# Patient Record
Sex: Female | Born: 1940 | Race: White | Hispanic: No | Smoking: Former smoker
Health system: Southern US, Community
[De-identification: ages and names within clinical notes are randomized; demographics above are authoritative.]

## PROBLEM LIST (undated history)

## (undated) DIAGNOSIS — R011 Cardiac murmur, unspecified: Secondary | ICD-10-CM

## (undated) DIAGNOSIS — Z96651 Presence of right artificial knee joint: Secondary | ICD-10-CM

## (undated) DIAGNOSIS — E785 Hyperlipidemia, unspecified: Secondary | ICD-10-CM

## (undated) DIAGNOSIS — I2510 Atherosclerotic heart disease of native coronary artery without angina pectoris: Secondary | ICD-10-CM

## (undated) DIAGNOSIS — H8110 Benign paroxysmal vertigo, unspecified ear: Secondary | ICD-10-CM

## (undated) DIAGNOSIS — K219 Gastro-esophageal reflux disease without esophagitis: Secondary | ICD-10-CM

## (undated) DIAGNOSIS — IMO0001 Reserved for inherently not codable concepts without codable children: Secondary | ICD-10-CM

## (undated) DIAGNOSIS — K649 Unspecified hemorrhoids: Secondary | ICD-10-CM

## (undated) DIAGNOSIS — E079 Disorder of thyroid, unspecified: Secondary | ICD-10-CM

## (undated) DIAGNOSIS — I10 Essential (primary) hypertension: Secondary | ICD-10-CM

## (undated) DIAGNOSIS — Z8489 Family history of other specified conditions: Secondary | ICD-10-CM

## (undated) DIAGNOSIS — D494 Neoplasm of unspecified behavior of bladder: Secondary | ICD-10-CM

## (undated) DIAGNOSIS — D649 Anemia, unspecified: Secondary | ICD-10-CM

## (undated) DIAGNOSIS — E039 Hypothyroidism, unspecified: Secondary | ICD-10-CM

## (undated) DIAGNOSIS — R319 Hematuria, unspecified: Secondary | ICD-10-CM

## (undated) DIAGNOSIS — I6529 Occlusion and stenosis of unspecified carotid artery: Secondary | ICD-10-CM

## (undated) DIAGNOSIS — C801 Malignant (primary) neoplasm, unspecified: Secondary | ICD-10-CM

## (undated) DIAGNOSIS — Z972 Presence of dental prosthetic device (complete) (partial): Secondary | ICD-10-CM

## (undated) DIAGNOSIS — G4730 Sleep apnea, unspecified: Secondary | ICD-10-CM

## (undated) DIAGNOSIS — M1990 Unspecified osteoarthritis, unspecified site: Secondary | ICD-10-CM

## (undated) DIAGNOSIS — E871 Hypo-osmolality and hyponatremia: Secondary | ICD-10-CM

## (undated) DIAGNOSIS — J449 Chronic obstructive pulmonary disease, unspecified: Secondary | ICD-10-CM

## (undated) DIAGNOSIS — K2100 Gastro-esophageal reflux disease with esophagitis, without bleeding: Secondary | ICD-10-CM

## (undated) DIAGNOSIS — I517 Cardiomegaly: Secondary | ICD-10-CM

## (undated) DIAGNOSIS — M1712 Unilateral primary osteoarthritis, left knee: Secondary | ICD-10-CM

## (undated) DIAGNOSIS — M2550 Pain in unspecified joint: Secondary | ICD-10-CM

## (undated) DIAGNOSIS — K2270 Barrett's esophagus without dysplasia: Secondary | ICD-10-CM

## (undated) DIAGNOSIS — B351 Tinea unguium: Secondary | ICD-10-CM

## (undated) DIAGNOSIS — K5720 Diverticulitis of large intestine with perforation and abscess without bleeding: Secondary | ICD-10-CM

## (undated) DIAGNOSIS — R7303 Prediabetes: Secondary | ICD-10-CM

## (undated) DIAGNOSIS — Z8619 Personal history of other infectious and parasitic diseases: Secondary | ICD-10-CM

## (undated) HISTORY — DX: Occlusion and stenosis of unspecified carotid artery: I65.29

## (undated) HISTORY — DX: Unspecified hemorrhoids: K64.9

## (undated) HISTORY — DX: Disorder of thyroid, unspecified: E07.9

## (undated) HISTORY — DX: Barrett's esophagus without dysplasia: K22.70

## (undated) HISTORY — DX: Gastro-esophageal reflux disease without esophagitis: K21.9

## (undated) HISTORY — DX: Cardiomegaly: I51.7

## (undated) HISTORY — DX: Unspecified osteoarthritis, unspecified site: M19.90

## (undated) HISTORY — PX: POPLITEAL SYNOVIAL CYST EXCISION: SUR555

## (undated) HISTORY — DX: Gastro-esophageal reflux disease with esophagitis, without bleeding: K21.00

## (undated) HISTORY — DX: Neoplasm of unspecified behavior of bladder: D49.4

## (undated) HISTORY — PX: UPPER GI ENDOSCOPY: SHX6162

## (undated) HISTORY — DX: Diverticulitis of large intestine with perforation and abscess without bleeding: K57.20

## (undated) HISTORY — DX: Personal history of other infectious and parasitic diseases: Z86.19

## (undated) HISTORY — DX: Essential (primary) hypertension: I10

## (undated) HISTORY — DX: Hypothyroidism, unspecified: E03.9

## (undated) HISTORY — DX: Tinea unguium: B35.1

## (undated) HISTORY — DX: Reserved for inherently not codable concepts without codable children: IMO0001

## (undated) HISTORY — DX: Anemia, unspecified: D64.9

## (undated) HISTORY — DX: Pain in unspecified joint: M25.50

## (undated) HISTORY — DX: Gastro-esophageal reflux disease with esophagitis: K21.0

## (undated) HISTORY — DX: Hyperlipidemia, unspecified: E78.5

## (undated) HISTORY — DX: Hematuria, unspecified: R31.9

---

## 1978-06-21 HISTORY — PX: OTHER SURGICAL HISTORY: SHX169

## 1986-06-21 HISTORY — PX: BREAST EXCISIONAL BIOPSY: SUR124

## 2001-04-25 ENCOUNTER — Other Ambulatory Visit: Admission: RE | Admit: 2001-04-25 | Discharge: 2001-04-25 | Payer: Self-pay | Admitting: Family Medicine

## 2002-06-21 DIAGNOSIS — H8110 Benign paroxysmal vertigo, unspecified ear: Secondary | ICD-10-CM

## 2002-06-21 HISTORY — DX: Benign paroxysmal vertigo, unspecified ear: H81.10

## 2004-06-21 HISTORY — PX: EYE SURGERY: SHX253

## 2004-12-08 ENCOUNTER — Ambulatory Visit: Payer: Self-pay | Admitting: Family Medicine

## 2005-12-28 ENCOUNTER — Ambulatory Visit: Payer: Self-pay | Admitting: Family Medicine

## 2005-12-30 ENCOUNTER — Ambulatory Visit: Payer: Self-pay | Admitting: Family Medicine

## 2006-06-06 ENCOUNTER — Ambulatory Visit: Payer: Self-pay | Admitting: Otolaryngology

## 2006-06-27 ENCOUNTER — Encounter: Payer: Self-pay | Admitting: Family Medicine

## 2006-07-04 ENCOUNTER — Ambulatory Visit: Payer: Self-pay | Admitting: Family Medicine

## 2006-07-22 ENCOUNTER — Encounter: Payer: Self-pay | Admitting: Family Medicine

## 2006-08-20 ENCOUNTER — Encounter: Payer: Self-pay | Admitting: Family Medicine

## 2006-09-20 ENCOUNTER — Encounter: Payer: Self-pay | Admitting: Family Medicine

## 2006-10-20 ENCOUNTER — Encounter: Payer: Self-pay | Admitting: Family Medicine

## 2006-11-20 ENCOUNTER — Encounter: Payer: Self-pay | Admitting: Family Medicine

## 2006-12-20 ENCOUNTER — Encounter: Payer: Self-pay | Admitting: Family Medicine

## 2007-09-19 ENCOUNTER — Ambulatory Visit: Payer: Self-pay | Admitting: Family Medicine

## 2008-08-28 ENCOUNTER — Ambulatory Visit: Payer: Self-pay | Admitting: Unknown Physician Specialty

## 2008-10-01 ENCOUNTER — Ambulatory Visit: Payer: Self-pay | Admitting: Family Medicine

## 2009-06-21 DIAGNOSIS — G4730 Sleep apnea, unspecified: Secondary | ICD-10-CM

## 2009-06-21 HISTORY — DX: Sleep apnea, unspecified: G47.30

## 2009-10-02 ENCOUNTER — Ambulatory Visit: Payer: Self-pay | Admitting: Family Medicine

## 2010-06-21 DIAGNOSIS — K2270 Barrett's esophagus without dysplasia: Secondary | ICD-10-CM

## 2010-06-21 HISTORY — DX: Barrett's esophagus without dysplasia: K22.70

## 2010-07-13 ENCOUNTER — Ambulatory Visit: Payer: Self-pay | Admitting: Unknown Physician Specialty

## 2010-10-13 ENCOUNTER — Ambulatory Visit: Payer: Self-pay | Admitting: Family Medicine

## 2010-11-13 LAB — HM PAP SMEAR: HM Pap smear: NORMAL

## 2011-11-09 ENCOUNTER — Ambulatory Visit: Payer: Self-pay | Admitting: Family Medicine

## 2011-11-23 ENCOUNTER — Ambulatory Visit: Payer: Self-pay | Admitting: Family Medicine

## 2012-08-29 ENCOUNTER — Encounter: Payer: Self-pay | Admitting: *Deleted

## 2012-09-01 ENCOUNTER — Encounter: Payer: Self-pay | Admitting: *Deleted

## 2012-09-07 ENCOUNTER — Ambulatory Visit (INDEPENDENT_AMBULATORY_CARE_PROVIDER_SITE_OTHER): Payer: Medicare Other | Admitting: General Surgery

## 2012-09-07 ENCOUNTER — Encounter: Payer: Self-pay | Admitting: General Surgery

## 2012-09-07 VITALS — BP 165/95 | HR 78 | Resp 14 | Ht 64.0 in | Wt 207.0 lb

## 2012-09-07 DIAGNOSIS — D649 Anemia, unspecified: Secondary | ICD-10-CM

## 2012-09-07 DIAGNOSIS — K219 Gastro-esophageal reflux disease without esophagitis: Secondary | ICD-10-CM | POA: Insufficient documentation

## 2012-09-07 DIAGNOSIS — D509 Iron deficiency anemia, unspecified: Secondary | ICD-10-CM | POA: Insufficient documentation

## 2012-09-07 DIAGNOSIS — K649 Unspecified hemorrhoids: Secondary | ICD-10-CM | POA: Insufficient documentation

## 2012-09-07 DIAGNOSIS — Z1211 Encounter for screening for malignant neoplasm of colon: Secondary | ICD-10-CM

## 2012-09-07 MED ORDER — POLYETHYLENE GLYCOL 3350 17 GM/SCOOP PO POWD: 17.0000 g | Freq: Every day | ORAL | Status: DC

## 2012-09-07 NOTE — Patient Instructions (Addendum)
Patient to complete 3 day hemoccult cards and return to our office.   Patient to be scheduled for a colonoscopy at [facility]. She will be contacted once May 2014 schedule is available. This patient is requesting this be completed on a Wednesday morning when daughter can be present.

## 2012-09-07 NOTE — Progress Notes (Signed)
Patient ID: Barbara Hernandez, female   DOB: Aug 12, 1940, 73 y.o.   MRN: [id number]  Chief Complaint  Patient presents with  . Anemia    evaluation for screening colonoscopy    HPI Barbara Hernandez is a 72 y.o. female. The patient presents for a screening colonoscopy due to anemia. Patient states her last colonoscopy was 10 years ago without any problems to her knowledge. No known family history or personal history of colon problems.  HPI  Past Medical History  Diagnosis Date  . Hemorrhoid   . Thyroid disease   . Reflux   . Arthritis   . Joint pain     Past Surgical History  Procedure Laterality Date  . Breast surgery Left 1988    Family History  Problem Relation Age of Onset  . Cancer Mother     breast  . Cancer Father     lung  . Heart disease Sister   . Asthma Brother   . Heart disease Sister   . Heart disease Sister     Social History History  Substance Use Topics  . Smoking status: Former Smoker -- 0.50 packs/day for 20 years    Types: Cigarettes  . Smokeless tobacco: Never Used  . Alcohol Use: .5 - 1 oz/week    1-2 drink(s) per week    No Known Allergies  Current Outpatient Prescriptions  Medication Sig Dispense Refill  . aspirin 81 MG tablet Take 81 mg by mouth daily.      . CELEBREX 200 MG capsule Take 1 capsule by mouth daily.      . Cholecalciferol (VITAMIN D3) 2000 UNITS TABS Take 1 tablet by mouth daily.      Tery Sanfilippo Sodium 100 MG capsule Take 100 mg by mouth daily.      . hydrochlorothiazide (HYDRODIURIL) 12.5 MG tablet Take 1 tablet by mouth daily.      Marland Kitchen lisinopril (PRINIVIL,ZESTRIL) 40 MG tablet Take 1 tablet by mouth daily.      Marland Kitchen omeprazole (PRILOSEC) 20 MG capsule Take 1 capsule by mouth daily.      . simvastatin (ZOCOR) 20 MG tablet Take 20 mg by mouth daily.       Marland Kitchen SYNTHROID 125 MCG tablet Take 1 tablet by mouth daily.      . vitamin B-12 (CYANOCOBALAMIN) 1000 MCG tablet Take 1,000 mcg by mouth daily.      . polyethylene glycol powder  (GLYCOLAX/MIRALAX) powder Take 17 g by mouth daily.  255 g  0   No current facility-administered medications for this visit.    Review of Systems Review of Systems  Blood pressure 165/95, pulse 78, resp. rate 14, height 4\' 6"  (1.372 m), weight 207 lb (93.895 kg).  Physical Exam Physical Exam  Data Reviewed Laboratory data showed a hemoglobin of 11.6 with an MCV of 82. Platelet count 3 10,000, white blood cell count 6200 with a normal differential, normal B12 and folate level.  Assessment    Anemia, chronic gastroesophageal reflux.    Plan    Upper & lower endoscopy was reviewed with the patient. Thus we scheduled a convenient time.the patient will be asked to submit a set of stool Hemoccults slides. She does not need to stop her chronic anti-inflammatory medications during stool sample collection.        [name] 09/08/2012, 12:11 PM

## 2012-09-12 ENCOUNTER — Telehealth: Payer: Self-pay | Admitting: *Deleted

## 2012-09-12 NOTE — Telephone Encounter (Signed)
Patient was contacted today to arrange for an upper and lower endoscopy at [facility]. This has been scheduled for 11-15-12. Miralax prescription has already been sent in to patient's pharmacy.

## 2012-09-13 ENCOUNTER — Encounter: Payer: Self-pay | Admitting: *Deleted

## 2012-09-13 NOTE — Progress Notes (Signed)
Patient ID: Barbara Hernandez, female   DOB: Jul 16, 1940, 72 y.o.   MRN: [id number] Patient brought by stool cards for guaiac.  Stool card dated 09-10-12, 09-11-12, 09-13-12 all negative for blood.   Will proceed with planned EGD/ Colonoscopy.

## 2012-10-09 ENCOUNTER — Encounter: Payer: Self-pay | Admitting: *Deleted

## 2012-10-25 ENCOUNTER — Telehealth: Payer: Self-pay | Admitting: *Deleted

## 2012-10-25 NOTE — Telephone Encounter (Signed)
Patient called wanting to reschedule upper and lower endoscopy from 11-15-12 to 01-16-13. This patient's transportation for day of procedure had a change in her schedule and could not be there that day. Patient will be contacted prior to verify no medication changes.

## 2013-01-01 ENCOUNTER — Encounter: Payer: Self-pay | Admitting: General Surgery

## 2013-01-01 ENCOUNTER — Ambulatory Visit (INDEPENDENT_AMBULATORY_CARE_PROVIDER_SITE_OTHER): Payer: Medicare Other | Admitting: General Surgery

## 2013-01-01 ENCOUNTER — Ambulatory Visit: Payer: Self-pay | Admitting: Family Medicine

## 2013-01-01 VITALS — BP 140/70 | HR 68 | Resp 12 | Ht 64.0 in | Wt 204.0 lb

## 2013-01-01 DIAGNOSIS — K219 Gastro-esophageal reflux disease without esophagitis: Secondary | ICD-10-CM

## 2013-01-01 DIAGNOSIS — Z1211 Encounter for screening for malignant neoplasm of colon: Secondary | ICD-10-CM

## 2013-01-01 DIAGNOSIS — D649 Anemia, unspecified: Secondary | ICD-10-CM

## 2013-01-01 NOTE — Patient Instructions (Addendum)
Upper GI Endoscopy Upper GI endoscopy means using a flexible scope to look at the esophagus, stomach, and upper small bowel. This is done to make a diagnosis in people with heartburn, abdominal pain, or abnormal bleeding. Sometimes an endoscope is needed to remove foreign bodies or food that become stuck in the esophagus; it can also be used to take biopsy samples. For the best results, do not eat or drink for 8 hours before having your upper endoscopy.  To perform the endoscopy, you will probably be sedated and your throat will be numbed with a special spray. The endoscope is then slowly passed down your throat (this will not interfere with your breathing). An endoscopy exam takes 15 to 30 minutes to complete and there is no real pain. Patients rarely remember much about the procedure. The results of the test may take several days if a biopsy or other test is taken.  You may have a sore throat after an endoscopy exam. Serious complications are very rare. Stick to liquids and soft foods until your pain is better. Do not drive a car or operate any dangerous equipment for at least 24 hours after being sedated. SEEK IMMEDIATE MEDICAL CARE IF:   You have severe throat pain.   You have shortness of breath.   You have bleeding problems.   You have a fever.   You have difficulty recovering from your sedation.  Document Released: 07/15/2004 Document Revised: 05/27/2011 Document Reviewed: 06/09/2008 [facility] Patient Information 2012 [place], [place].  Colonoscopy A colonoscopy is an exam to evaluate your entire colon. In this exam, your colon is cleansed. A long fiberoptic tube is inserted through your rectum and into your colon. The fiberoptic scope (endoscope) is a long bundle of enclosed and very flexible fibers. These fibers transmit light to the area examined and send images from that area to your caregiver. Discomfort is usually minimal. You may be given a drug to help you sleep (sedative) during  or prior to the procedure. This exam helps to detect lumps (tumors), polyps, inflammation, and areas of bleeding. Your caregiver may also take a small piece of tissue (biopsy) that will be examined under a microscope. LET YOUR CAREGIVER KNOW ABOUT:   Allergies to food or medicine.  Medicines taken, including vitamins, herbs, eyedrops, over-the-counter medicines, and creams.  Use of steroids (by mouth or creams).  Previous problems with anesthetics or numbing medicines.  History of bleeding problems or blood clots.  Previous surgery.  Other health problems, including diabetes and kidney problems.  Possibility of pregnancy, if this applies. BEFORE THE PROCEDURE   A clear liquid diet may be required for 2 days before the exam.  Ask your caregiver about changing or stopping your regular medications.  Liquid injections (enemas) or laxatives may be required.  A large amount of electrolyte solution may be given to you to drink over a short period of time. This solution is used to clean out your colon.  You should be present 60 minutes prior to your procedure or as directed by your caregiver. AFTER THE PROCEDURE   If you received a sedative or pain relieving medication, you will need to arrange for someone to drive you home.  Occasionally, there is a little blood passed with the first bowel movement. Do not be concerned. FINDING OUT THE RESULTS OF YOUR TEST Not all test results are available during your visit. If your test results are not back during the visit, make an appointment with your caregiver to find  out the results. Do not assume everything is normal if you have not heard from your caregiver or the medical facility. It is important for you to follow up on all of your test results. HOME CARE INSTRUCTIONS   It is not unusual to pass moderate amounts of gas and experience mild abdominal cramping following the procedure. This is due to air being used to inflate your colon during  the exam. Walking or a warm pack on your belly (abdomen) may help.  You may resume all normal meals and activities after sedatives and medicines have worn off.  Only take over-the-counter or prescription medicines for pain, discomfort, or fever as directed by your caregiver. Do not use aspirin or blood thinners if a biopsy was taken. Consult your caregiver for medicine usage if biopsies were taken. SEEK IMMEDIATE MEDICAL CARE IF:   You have a fever.  You pass large blood clots or fill a toilet with blood following the procedure. This may also occur 10 to 14 days following the procedure. This is more likely if a biopsy was taken.  You develop abdominal pain that keeps getting worse and cannot be relieved with medicine. Document Released: 06/04/2000 Document Revised: 08/30/2011 Document Reviewed: 01/18/2008 [facility] Patient Information 2014 [place], [place].  Patient has been scheduled for a colonoscopy on 01-16-13 at [facility]. It is okay for patient to continue 81 mg aspirin.

## 2013-01-01 NOTE — Progress Notes (Signed)
Patient ID: Barbara Hernandez, female   DOB: Aug 02, 1940, 72 y.o.   MRN: [id number]  Chief Complaint  Patient presents with  . Other    Pre op upper and lower endoscopy    HPI Barbara Hernandez is a 72 y.o. female who presents for a pre op evaluation for an upper and lower endoscopy. No complaints at this time. The patient has a history of reflux, anemia, and barrett's esophagus.    HPI  Past Medical History  Diagnosis Date  . Hemorrhoid   . Thyroid disease   . Reflux   . Arthritis   . Joint pain   . GERD (gastroesophageal reflux disease)   . Barrett's esophagus 2011  . Anemia     Past Surgical History  Procedure Laterality Date  . Breast surgery Left 1988    Family History  Problem Relation Age of Onset  . Cancer Mother     breast  . Cancer Father     lung  . Heart disease Sister   . Asthma Brother   . Heart disease Sister   . Heart disease Sister     Social History History  Substance Use Topics  . Smoking status: Former Smoker -- 0.50 packs/day for 20 years    Types: Cigarettes  . Smokeless tobacco: Never Used  . Alcohol Use: .5 - 1 oz/week    1-2 drink(s) per week    No Known Allergies  Current Outpatient Prescriptions  Medication Sig Dispense Refill  . aspirin 81 MG tablet Take 81 mg by mouth daily.      . CELEBREX 200 MG capsule Take 1 capsule by mouth daily.      . Cholecalciferol (VITAMIN D3) 2000 UNITS TABS Take 1 tablet by mouth daily.      Tery Sanfilippo Sodium 100 MG capsule Take 100 mg by mouth daily.      . hydrochlorothiazide (HYDRODIURIL) 12.5 MG tablet Take 1 tablet by mouth daily.      Marland Kitchen lisinopril (PRINIVIL,ZESTRIL) 40 MG tablet Take 1 tablet by mouth daily.      Marland Kitchen omeprazole (PRILOSEC) 20 MG capsule Take 1 capsule by mouth daily.      . polyethylene glycol powder (GLYCOLAX/MIRALAX) powder Take 17 g by mouth daily.  255 g  0  . simvastatin (ZOCOR) 20 MG tablet Take 20 mg by mouth daily.       Marland Kitchen SYNTHROID 125 MCG tablet Take 1 tablet by mouth  daily.      . vitamin B-12 (CYANOCOBALAMIN) 1000 MCG tablet Take 1,000 mcg by mouth daily.       No current facility-administered medications for this visit.    Review of Systems Review of Systems  Constitutional: Negative.   Respiratory: Negative.   Cardiovascular: Negative.   Gastrointestinal: Negative.     Blood pressure 140/70, pulse 68, resp. rate 12, height 5\' 4"  (1.626 m), weight 204 lb (92.534 kg).  Physical Exam Physical Exam  Constitutional: She is oriented to person, place, and time. She appears well-developed and well-nourished.  Cardiovascular: Normal rate and regular rhythm.   Murmur heard.  Systolic murmur is present with a grade of 2/6  Unchanged heart murmur  Pulmonary/Chest: Effort normal and breath sounds normal.  Neurological: She is alert and oriented to person, place, and time.  Skin: Skin is warm and dry.    Data Reviewed No new data to review.  Assessment    Chronic reflux. Microcytic anemia.     Plan    Upper  or lower endoscopy has been discussed.     Patient has been scheduled for a colonoscopy on 01-16-13 at [facility]. It is okay for patient to continue 81 mg aspirin.   Barbara Hernandez 01/02/2013, 9:48 PM

## 2013-01-02 ENCOUNTER — Encounter: Payer: Self-pay | Admitting: General Surgery

## 2013-01-02 ENCOUNTER — Other Ambulatory Visit: Payer: Self-pay | Admitting: General Surgery

## 2013-01-02 DIAGNOSIS — Z1211 Encounter for screening for malignant neoplasm of colon: Secondary | ICD-10-CM

## 2013-01-02 DIAGNOSIS — K219 Gastro-esophageal reflux disease without esophagitis: Secondary | ICD-10-CM

## 2013-01-02 DIAGNOSIS — D649 Anemia, unspecified: Secondary | ICD-10-CM

## 2013-01-16 ENCOUNTER — Ambulatory Visit: Payer: Self-pay | Admitting: General Surgery

## 2013-01-16 DIAGNOSIS — K2270 Barrett's esophagus without dysplasia: Secondary | ICD-10-CM

## 2013-01-16 DIAGNOSIS — D128 Benign neoplasm of rectum: Secondary | ICD-10-CM

## 2013-01-16 DIAGNOSIS — D129 Benign neoplasm of anus and anal canal: Secondary | ICD-10-CM

## 2013-01-16 HISTORY — PX: COLONOSCOPY W/ BIOPSIES: SHX1374

## 2013-01-16 LAB — HM COLONOSCOPY

## 2013-01-17 ENCOUNTER — Encounter: Payer: Self-pay | Admitting: General Surgery

## 2013-01-18 ENCOUNTER — Other Ambulatory Visit: Payer: Self-pay | Admitting: General Surgery

## 2013-01-18 LAB — PATHOLOGY REPORT

## 2013-01-19 ENCOUNTER — Encounter: Payer: Self-pay | Admitting: General Surgery

## 2013-01-20 ENCOUNTER — Encounter: Payer: Self-pay | Admitting: General Surgery

## 2013-01-20 NOTE — Progress Notes (Signed)
Patient ID: Barbara Hernandez, female   DOB: [birth year], 72 y.o.   MRN: [id number]   The patient was notified that the gastric biopsies and polyps.  Biopsies of the distal esophagus showed minimal changes consistent reflux. No evidence of persistent Barrett's epithelial change. (Step biopsies obtained).  Colonoscopy showed only benign no source identified for current anemia.  Unless the patient has been noted with heme positive stools, a small bowel follow-through or capsule endoscopy would not be recommended.  The patient's reflux symptoms are well controlled on daily Prilosec, 20 mg.   If she remains asymptomatic, a followup endoscopy in 3 years because of the 2012 findings of Barrett's epithelial change would be recommended.

## 2013-01-24 ENCOUNTER — Other Ambulatory Visit: Payer: Self-pay

## 2013-04-26 ENCOUNTER — Other Ambulatory Visit: Payer: Self-pay

## 2013-05-16 ENCOUNTER — Ambulatory Visit: Payer: Self-pay | Admitting: Orthopedic Surgery

## 2013-06-21 HISTORY — PX: BLEPHAROPLASTY: SUR158

## 2013-07-16 ENCOUNTER — Ambulatory Visit: Payer: Self-pay | Admitting: Orthopedic Surgery

## 2013-11-27 LAB — TSH: TSH: 0.78 u[IU]/mL (ref <=5.90)

## 2013-11-27 LAB — LIPID PANEL
Cholesterol: 177 mg/dL (ref 0–200)
HDL: 86 mg/dL — AB (ref 35–70)

## 2013-11-27 LAB — CBC AND DIFFERENTIAL: HEMOGLOBIN: 11.9 g/dL — AB (ref 12.0–16.0)

## 2013-12-17 ENCOUNTER — Telehealth: Payer: Self-pay | Admitting: Cardiovascular Disease

## 2013-12-17 NOTE — Telephone Encounter (Signed)
Pt called says she need new sleep order per Hillery Jacks in medical records.Marland Kitchen

## 2013-12-17 NOTE — Telephone Encounter (Signed)
Patient called to inform that her CPAP machine has broken. She was a previous patient of sleep management. [facility] now uses [facility] and they need a referral.  Informed patient that I will send the referral along with the needed records to [facility] soon as i can. Patient voiced her understanding.

## 2013-12-19 ENCOUNTER — Telehealth: Payer: Self-pay | Admitting: *Deleted

## 2013-12-19 NOTE — Telephone Encounter (Signed)
Faxed referral and records for CPAP therapy and supplies. (SMS transfer).

## 2014-02-27 ENCOUNTER — Ambulatory Visit: Payer: Self-pay | Admitting: Family Medicine

## 2014-02-27 LAB — HM MAMMOGRAPHY

## 2014-04-22 ENCOUNTER — Encounter: Payer: Self-pay | Admitting: General Surgery

## 2014-06-17 ENCOUNTER — Ambulatory Visit: Payer: Self-pay | Admitting: Physician Assistant

## 2014-06-17 LAB — URINALYSIS, COMPLETE
Bacteria: NEGATIVE
Bilirubin,UR: NEGATIVE
Glucose,UR: NEGATIVE
KETONE: NEGATIVE
Leukocyte Esterase: NEGATIVE
Nitrite: NEGATIVE
Ph: 7 (ref 5.0–8.0)
Protein: NEGATIVE
Specific Gravity: 1.015 (ref 1.000–1.030)
Squamous Epithelial: NONE SEEN
WBC UR: NONE SEEN /HPF (ref 0–5)

## 2014-06-17 LAB — OCCULT BLOOD X 1 CARD TO LAB, STOOL: OCCULT BLOOD, FECES: NEGATIVE

## 2014-06-19 LAB — URINE CULTURE

## 2014-07-03 ENCOUNTER — Ambulatory Visit: Payer: Self-pay | Admitting: Orthopedic Surgery

## 2014-08-14 DIAGNOSIS — I6529 Occlusion and stenosis of unspecified carotid artery: Secondary | ICD-10-CM

## 2014-08-14 DIAGNOSIS — I340 Nonrheumatic mitral (valve) insufficiency: Secondary | ICD-10-CM | POA: Insufficient documentation

## 2014-08-14 HISTORY — DX: Occlusion and stenosis of unspecified carotid artery: I65.29

## 2014-10-08 ENCOUNTER — Encounter: Payer: Self-pay | Admitting: Family Medicine

## 2014-10-08 DIAGNOSIS — B351 Tinea unguium: Secondary | ICD-10-CM | POA: Insufficient documentation

## 2014-10-08 DIAGNOSIS — R011 Cardiac murmur, unspecified: Secondary | ICD-10-CM

## 2014-10-08 DIAGNOSIS — I10 Essential (primary) hypertension: Secondary | ICD-10-CM

## 2014-10-08 DIAGNOSIS — E038 Other specified hypothyroidism: Secondary | ICD-10-CM

## 2014-10-08 DIAGNOSIS — E039 Hypothyroidism, unspecified: Secondary | ICD-10-CM | POA: Insufficient documentation

## 2014-10-08 DIAGNOSIS — E7800 Pure hypercholesterolemia, unspecified: Secondary | ICD-10-CM

## 2014-10-08 DIAGNOSIS — J309 Allergic rhinitis, unspecified: Secondary | ICD-10-CM | POA: Insufficient documentation

## 2014-10-08 DIAGNOSIS — D510 Vitamin B12 deficiency anemia due to intrinsic factor deficiency: Secondary | ICD-10-CM

## 2014-10-08 DIAGNOSIS — E559 Vitamin D deficiency, unspecified: Secondary | ICD-10-CM

## 2014-10-08 DIAGNOSIS — R319 Hematuria, unspecified: Secondary | ICD-10-CM

## 2014-10-08 DIAGNOSIS — M19011 Primary osteoarthritis, right shoulder: Secondary | ICD-10-CM | POA: Insufficient documentation

## 2014-10-08 DIAGNOSIS — K2270 Barrett's esophagus without dysplasia: Secondary | ICD-10-CM

## 2014-10-08 DIAGNOSIS — H8110 Benign paroxysmal vertigo, unspecified ear: Secondary | ICD-10-CM | POA: Insufficient documentation

## 2014-10-08 DIAGNOSIS — I517 Cardiomegaly: Secondary | ICD-10-CM

## 2014-10-08 DIAGNOSIS — G4730 Sleep apnea, unspecified: Secondary | ICD-10-CM

## 2014-10-09 ENCOUNTER — Encounter: Payer: Self-pay | Admitting: Family Medicine

## 2014-10-15 ENCOUNTER — Ambulatory Visit
Admit: 2014-10-15 | Disposition: A | Discharge status: Home / Self Care | Payer: Self-pay | Attending: Urology | Admitting: Urology

## 2014-11-14 ENCOUNTER — Encounter
Admission: RE | Admit: 2014-11-14 | Discharge: 2014-11-14 | Disposition: A | Discharge status: Home / Self Care | Payer: Medicare Other | Source: Ambulatory Visit | Attending: Urology | Admitting: Urology

## 2014-11-14 DIAGNOSIS — I10 Essential (primary) hypertension: Secondary | ICD-10-CM | POA: Diagnosis not present

## 2014-11-14 DIAGNOSIS — Z01812 Encounter for preprocedural laboratory examination: Secondary | ICD-10-CM | POA: Insufficient documentation

## 2014-11-14 DIAGNOSIS — Z01810 Encounter for preprocedural cardiovascular examination: Secondary | ICD-10-CM | POA: Insufficient documentation

## 2014-11-14 HISTORY — DX: Benign paroxysmal vertigo, unspecified ear: H81.10

## 2014-11-14 HISTORY — DX: Sleep apnea, unspecified: G47.30

## 2014-11-14 HISTORY — DX: Family history of other specified conditions: Z84.89

## 2014-11-14 LAB — BASIC METABOLIC PANEL
ANION GAP: 9 (ref 5–15)
BUN: 13 mg/dL (ref 6–20)
CALCIUM: 9.3 mg/dL (ref 8.9–10.3)
CO2: 28 mmol/L (ref 22–32)
Chloride: 98 mmol/L — ABNORMAL LOW (ref 101–111)
Creatinine, Ser: 0.62 mg/dL (ref 0.44–1.00)
GLUCOSE: 102 mg/dL — AB (ref 65–99)
Potassium: 4.4 mmol/L (ref 3.5–5.1)
SODIUM: 135 mmol/L (ref 135–145)

## 2014-11-14 LAB — CBC
HEMATOCRIT: 38.8 % (ref 35.0–47.0)
Hemoglobin: 12.5 g/dL (ref 12.0–16.0)
MCH: 28.2 pg (ref 26.0–34.0)
MCHC: 32.3 g/dL (ref 32.0–36.0)
MCV: 87.2 fL (ref 80.0–100.0)
PLATELETS: 219 10*3/uL (ref 150–440)
RBC: 4.45 MIL/uL (ref 3.80–5.20)
RDW: 15 % — ABNORMAL HIGH (ref 11.5–14.5)
WBC: 5.1 10*3/uL (ref 3.6–11.0)

## 2014-11-14 NOTE — Patient Instructions (Signed)
  Your procedure is scheduled on: November 20, 2014. Report to Same Day Surgery. To find out your arrival time please call [phone] between 1PM - 3PM on Nov 19, 2014.  Remember: Instructions that are not followed completely may result in serious medical risk, up to and including death, or upon the discretion of your surgeon and anesthesiologist your surgery may need to be rescheduled.    __x__ 1. Do not eat food or drink liquids after midnight. No gum chewing or hard candies.     __x__ 2. No Alcohol for 24 hours before or after surgery.   ____ 3. Bring all medications with you on the day of surgery if instructed.    __x__ 4. Notify your doctor if there is any change in your medical condition     (cold, fever, infections).     Do not wear jewelry, make-up, hairpins, clips or nail polish.  Do not wear lotions, powders, or perfumes. You may wear deodorant.  Do not shave 48 hours prior to surgery. Men may shave face and neck.  Do not bring valuables to the hospital.    [facility] is not responsible for any belongings or valuables.               Contacts, dentures or bridgework may not be worn into surgery.  Leave your suitcase in the car. After surgery it may be brought to your room.  For patients admitted to the hospital, discharge time is determined by your treatment team.   Patients discharged the day of surgery will not be allowed to drive home.   Please read over the following fact sheets that you were given:      __x__ Take these medicines the morning of surgery with A SIP OF WATER:    1. levothyroxine (SYNTHROID, LEVOTHROID)  2. ranitidine (ZANTAC)   ____ Fleet Enema (as directed)   ____ Use CHG Soap as directed  ____ Use inhalers on the day of surgery  ____ Stop metformin 2 days prior to surgery    ____ Take 1/2 of usual insulin dose the night before surgery and none on the morning of surgery.   ___x_ Stop Coumadin/Plavix/aspirin on 11/13/14  ____ Stop  Anti-inflammatories on does not take, does not apply.   __x__ Stop supplements glucosamine-chondroitin until after surgery.    ____ Bring C-Pap to the hospital.

## 2014-11-19 NOTE — H&P (Signed)
Barbara Hernandez 10/25/2014 8:19 AM Location: [facility] Patient #: [id number] DOB: [birth year] Widowed / Language: [name] / Race: White Female    History of Present [name], MD; 10/25/2014 11:49 AM) The patient is a 74 year old female who presents with hematuria. Note for "Hematuria": 74 year old female with painless gross hematuria 2 He presents today for office cystoscopy to complete her hematuria workup. CT urogram was negative for any GU or other pathology. Thee reporrt below.  She has seen blood in her urine on 2 separate occasions, one on June 17, 2014 afteer working out at [employer]. This resolved after one episode and did not recur until August 22, 2014. On that day, she developed worsening hematuria which lasted for an entire day with blood clots annd more significant bleeding of the first time. Each of these episodes were not associated with any pain, dysuria, flank pain,urgency, or frequency.  UA performed by her PCP soon after this event revealed presence of red blood cells but otherwise no suspicion for urinary tract infection. Her urine culture was negative in March.  She has a remote history of urinary tract infections after first being married but none since that time. She does report around that time, she did have a cystoscopy and some lesions in her bladder burned. She has not had any problems since then.  She denies a history of kidney stones. No family history of renal masses or bladder cancer.  She was a former smoker, smoked 15 years, 1 pack per day and quit in her mid 66s.  10/15/14 CT Urogram IMPRESSION: No renal, ureteral, or bladder calculi. No hydronephrosis. No enhancing renal lesions. No filling defects in the bilateral opacified proximal collecting systems, ureters, or bladder.     Problem List/Past Medical(Barbara Hernandez; 10/25/2014 11:30 AM) Benign essential hypertension (401.1   I10) Screening for mental disorder and developmental handicap (V79.9  Z13.89) Cervical cancer screening (V76.2  Z12.4) Apnea, sleep (780.57  G47.30). Uses CPAP at 9cm. 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 Systolic Murmur. Pt. has seen Cardiology for this. Dr. Nehemiah Massed, [facility] Onychomycosis (110.1  B35.1)    Allergies(Barbara Hernandez; 10/25/2014 11:30 AM) No Known Drug Allergies. 10/08/2014    Family History(Barbara Hernandez; 10/25/2014 11:30 AM) Negative Family History of:Marland Kitchen Bladder, kidney and prostate cancer    Social History(Barbara Hernandez; 10/25/2014 11:30 AM) Alcohol use. Occasional alcohol use. Tobacco use. Former smoker.    Travel History(Barbara Hernandez; 10/25/2014 11:32 AM) Have you traveled internationally in the last 21 days?. Yes, Date of travel. April 7th-17th, [place]    Medication History(Barbara Hernandez; 10/25/2014 11:32 AM) Aspirin (81MG  Tablet, 1 Tablet Oral daily) Active. CeleBREX (200MG  Capsule, 1 Capsule Oral daily, Taken starting 04/15/2014) Active. (Qty: 90; Refills: 3) Colace (100MG  Capsule, 1 Capsule Oral daily) Active. Glucosamine Complex (1 Tablet Oral daily) Active. Hydrochlorothiazide (12.5MG  Tablet, 1 Tablet Oral daily, Taken starting 04/11/2014) Active. (Qty: 90; Refills: 3) Lisinopril (40MG  Tablet, 1 Tablet Oral daily, Taken starting 04/15/2014) Active. (Qty: 90; Refills: 3) Simvastatin (20MG  Tablet, 1  Tablet Oral daily, Taken starting 04/15/2014) Active. (Qty: 90;  Refills: 3) Zantac 150 Maximum Strength (150MG  Tablet, 1 Tablet Oral two times daily, Taken starting 08/28/2014) Active. (Qty: 60; Refills: 6) Synthroid (112MCG Tablet, 1 Tablet Oral daily, Taken starting 08/24/2013) Active. (Qty: 90; Refills: 3) Vitamin B12 (100MCG Tablet, 1 Tablet Oral daily) Active. Vitamin D (2000UNIT Capsule, 1 Capsule Oral daily) Active. Medications Reconciled.    Past Surgical History(Barbara Hernandez; 10/25/2014 11:30 AM) Wrist Fracture Surgery. Left. 1980s Breast Biopsy - Left. 1980s    Other Problems(Barbara Hernandez; 10/25/2014 11:30 AM) Colon cancer screening (V76.51  Z12.11) Annual physical exam (V70.0  Z00.00) Need for vaccination with 13-polyvalent pneumococcal conjugate vaccine (V03.82  Z23) Influenza A (487.1  J10.1)    Review of Systems(Barbara Hernandez; 10/25/2014 11:30 AM) General:Not Present- Chills, Fatigue, Fever and Weight Gain > 10lbs.. Skin:Not Present- Hair Loss, Pruritus and Rash. HEENT:Not Present- Eye Pain, Decreased Hearing, Runny Nose, Snoring and Dry Mucous Membranes. Neck:Not Present- Neck Pain and Swollen Glands. Respiratory:Not Present- Chronic Cough, Difficulty Breathing on Exertion and Wakes up from Sleep Wheezing or Short of Breath. Cardiovascular:Not Present- Edema, Palpitations and Shortness of Breath. Gastrointestinal:Not Present- Constipation, Diarrhea, Nausea and Vomiting. Female Genitourinary:Present- Blood in Urine. Note:See HPI Musculoskeletal:Not Present- Back Pain and Joint Pain. Neurological:Not Present- Decreased Memory, Headaches and Seizures. Psychiatric:Not Present- Anxiety and Depression. Endocrine:Not Present- Excessive Sweating, Heat Intolerance and Tired/Sluggish. Hematology:Not Present- Abnormal Bleeding, Anemia, Blood Transfusion and Enlarged Lymph Nodes.    Vitals(Barbara Hernandez; 10/25/2014 11:34 AM) 10/25/2014 11:33  AM Weight: 204.38 lb Height: 64 in Height was reported by patient. Body Surface Area: 2.05 m Body Mass Index: 35.08 kg/m Pulse: 54 (Regular) BP: 121/78 (Sitting, Left Arm, Standard)     Physical Barbara Ferrari, MD; 10/25/2014 1:04 PM) The physical exam findings are as follows:   General General Appearance- Well groomed and Consistent with stated age. Not in acute distress.   Integumentary General Characteristics:Overall examination of the patient's skin reveals - no rashes, no suspicious lesions and no evidence of scars.   Head and Neck Head- normocephalic, atraumatic with no lesions or palpable masses.   Chest and Lung Exam Chest and lung exam reveals - normal excursion with symmetric chest walls and quiet, even and easy respiratory effort with no use of accessory muscles.   Cardiovascular Cardiovascular examination reveals - no digital clubbing, cyanosis, edema, increased warmth or tenderness.   Abdomen Palpation/Percussion:Palpation and Percussion of the abdomen reveal - Soft, Non Tender and No Rebound tenderness. Bladder- Non-palpable. Kidney (Left):Other Characteristics- Non Tender. Kidney (Right):Other Characteristics- Non Tender.   Female Genitourinary Evaluation of genitourinary system reveals - no tenderness, inflammation, rashes or lesions of external genitalia and urethra normal.   Neurologic Neurologic evaluation reveals - alert and oriented x 3 with no impairment of recent or remote memory, normal attention span and ability to concentrate and able to name objects and repeat phrases. Appropriate fund of knowledge . Coordination- Normal. Gait- Normal.   Neuropsychiatric Mental status exam performed with findings of - able to articulate well with normal speech/language, rate, volume and coherence. The patient's mood and affect are described as - normal.    Assessment & Barbara Chapel, MD; 10/25/2014 1:31  PM) Hematuria (599.70  R31.9) Impression: 74 year old female with painless gross hematuria 2. CT urogram was negative for any GU pathology although there is a small bladder tumor, 1 cm just distal to the LEFT on cystoscopy today. I have recommended TURBT for treatment of this tumor. The risks and benefits of surgery. We discussed the postoperative course as well as the importance  of follow-up to discuss pathology results which will dictate her further care. She'll most likely need routine surveillance with every 3 month cystoscopy was post op. -schedule TURBT -preop urine culture Current Plans l Pt Education - How to access health information online: discussed with patient and provided information. l URINALYSIS, AUTOMATED W/ MICRO (- [facility] -) (81001) l Started Ciprofloxacin HCl 500MG , 1 (one) Tablet one time dose, #1, 1 day starting 10/25/2014, No Refill. Given once in office prior to procedure. l URINE CULTURE, COMPREHENSIVE (09811) l Cysto / Separate (52000) l Cystoscopy Procedure (Note)  Patient identification was confirmed, informed consent was obtained, and patient was prepped using Betadine solution. Lidocaine jelly was administered per urethral meatus.  Preoperative abx where received prior to procedure.  Procedure: - Flexible cystoscope introduced, without any difficulty. - Thorough search of the bladder revealed: normal urethral meatus normal urothelium absent stones absent ulcers absent urethral polyps absent trabeculation  small approximately 1 cm bladder tumor noted just beyond the LEFT ureteral orifice, papillary on broad based stalk. There is also some underlying erythema.  - Ureteral orifices were normal in position and appearance.  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

## 2014-11-20 ENCOUNTER — Ambulatory Visit: Payer: Medicare Other | Admitting: Anesthesiology

## 2014-11-20 ENCOUNTER — Encounter
Admission: RE | Disposition: A | Discharge status: Home / Self Care | Payer: Self-pay | Source: Ambulatory Visit | Attending: Urology

## 2014-11-20 ENCOUNTER — Ambulatory Visit
Admission: RE | Admit: 2014-11-20 | Discharge: 2014-11-20 | Disposition: A | Discharge status: Home / Self Care | Payer: Medicare Other | Source: Ambulatory Visit | Attending: Urology | Admitting: Urology

## 2014-11-20 ENCOUNTER — Encounter: Payer: Self-pay | Admitting: *Deleted

## 2014-11-20 DIAGNOSIS — Z87891 Personal history of nicotine dependence: Secondary | ICD-10-CM | POA: Insufficient documentation

## 2014-11-20 DIAGNOSIS — D414 Neoplasm of uncertain behavior of bladder: Secondary | ICD-10-CM | POA: Insufficient documentation

## 2014-11-20 DIAGNOSIS — E780 Pure hypercholesterolemia: Secondary | ICD-10-CM | POA: Insufficient documentation

## 2014-11-20 DIAGNOSIS — Z7982 Long term (current) use of aspirin: Secondary | ICD-10-CM | POA: Insufficient documentation

## 2014-11-20 DIAGNOSIS — Z79899 Other long term (current) drug therapy: Secondary | ICD-10-CM | POA: Insufficient documentation

## 2014-11-20 DIAGNOSIS — K649 Unspecified hemorrhoids: Secondary | ICD-10-CM | POA: Diagnosis not present

## 2014-11-20 DIAGNOSIS — D510 Vitamin B12 deficiency anemia due to intrinsic factor deficiency: Secondary | ICD-10-CM | POA: Insufficient documentation

## 2014-11-20 DIAGNOSIS — B351 Tinea unguium: Secondary | ICD-10-CM | POA: Insufficient documentation

## 2014-11-20 DIAGNOSIS — K210 Gastro-esophageal reflux disease with esophagitis: Secondary | ICD-10-CM | POA: Insufficient documentation

## 2014-11-20 DIAGNOSIS — R319 Hematuria, unspecified: Secondary | ICD-10-CM | POA: Diagnosis not present

## 2014-11-20 DIAGNOSIS — I10 Essential (primary) hypertension: Secondary | ICD-10-CM | POA: Insufficient documentation

## 2014-11-20 DIAGNOSIS — D494 Neoplasm of unspecified behavior of bladder: Secondary | ICD-10-CM

## 2014-11-20 DIAGNOSIS — G4730 Sleep apnea, unspecified: Secondary | ICD-10-CM | POA: Diagnosis not present

## 2014-11-20 DIAGNOSIS — E039 Hypothyroidism, unspecified: Secondary | ICD-10-CM | POA: Diagnosis not present

## 2014-11-20 DIAGNOSIS — E559 Vitamin D deficiency, unspecified: Secondary | ICD-10-CM | POA: Insufficient documentation

## 2014-11-20 DIAGNOSIS — I517 Cardiomegaly: Secondary | ICD-10-CM | POA: Diagnosis not present

## 2014-11-20 DIAGNOSIS — K2270 Barrett's esophagus without dysplasia: Secondary | ICD-10-CM | POA: Diagnosis not present

## 2014-11-20 DIAGNOSIS — J309 Allergic rhinitis, unspecified: Secondary | ICD-10-CM | POA: Insufficient documentation

## 2014-11-20 HISTORY — PX: TRANSURETHRAL RESECTION OF BLADDER TUMOR: SHX2575

## 2014-11-20 SURGERY — TURBT (TRANSURETHRAL RESECTION OF BLADDER TUMOR)
Anesthesia: General | Wound class: Clean Contaminated

## 2014-11-20 MED ORDER — FENTANYL CITRATE (PF) 100 MCG/2ML IJ SOLN
INTRAMUSCULAR | Status: DC | PRN
  Administered 2014-11-20: 50 ug via INTRAVENOUS

## 2014-11-20 MED ORDER — LACTATED RINGERS IV SOLN
INTRAVENOUS | Status: DC
  Administered 2014-11-20: 13:00:00 via INTRAVENOUS

## 2014-11-20 MED ORDER — LEVOFLOXACIN IN D5W 500 MG/100ML IV SOLN
500.0000 mg | Freq: Once | INTRAVENOUS | Status: AC
  Administered 2014-11-20: 500 mg via INTRAVENOUS

## 2014-11-20 MED ORDER — PROPOFOL 10 MG/ML IV BOLUS
INTRAVENOUS | Status: DC | PRN
  Administered 2014-11-20: 150 mg via INTRAVENOUS

## 2014-11-20 MED ORDER — STERILE WATER FOR IRRIGATION IR SOLN
Status: DC | PRN
  Administered 2014-11-20: 500 mL

## 2014-11-20 MED ORDER — LEVOFLOXACIN IN D5W 500 MG/100ML IV SOLN
INTRAVENOUS | Status: AC
  Administered 2014-11-20: 500 mg via INTRAVENOUS
  Filled 2014-11-20: qty 100

## 2014-11-20 MED ORDER — ONDANSETRON HCL 4 MG/2ML IJ SOLN: 4.0000 mg | Freq: Once | INTRAMUSCULAR | Status: DC | PRN

## 2014-11-20 MED ORDER — LIDOCAINE HCL (CARDIAC) 20 MG/ML IV SOLN
INTRAVENOUS | Status: DC | PRN
  Administered 2014-11-20: 60 mg via INTRAVENOUS

## 2014-11-20 MED ORDER — HYDROCODONE-ACETAMINOPHEN 5-325 MG PO TABS: 1.0000 | ORAL_TABLET | Freq: Four times a day (QID) | ORAL | Status: DC | PRN

## 2014-11-20 MED ORDER — OXYBUTYNIN CHLORIDE 5 MG PO TABS: 5.0000 mg | ORAL_TABLET | Freq: Three times a day (TID) | ORAL | Status: DC | PRN

## 2014-11-20 MED ORDER — PHENAZOPYRIDINE HCL 200 MG PO TABS: 200.0000 mg | ORAL_TABLET | Freq: Three times a day (TID) | ORAL | Status: DC | PRN

## 2014-11-20 MED ORDER — FENTANYL CITRATE (PF) 100 MCG/2ML IJ SOLN: 25.0000 ug | INTRAMUSCULAR | Status: DC | PRN

## 2014-11-20 MED ORDER — MIDAZOLAM HCL 5 MG/5ML IJ SOLN
INTRAMUSCULAR | Status: DC | PRN
  Administered 2014-11-20: 2 mg via INTRAVENOUS

## 2014-11-20 MED ORDER — ONDANSETRON HCL 4 MG/2ML IJ SOLN
INTRAMUSCULAR | Status: DC | PRN
  Administered 2014-11-20: 4 mg via INTRAVENOUS

## 2014-11-20 SURGICAL SUPPLY — 25 items
BAG DRAIN CYSTO-URO LG1000N (MISCELLANEOUS) ×2 IMPLANT
BAG URO DRAIN 2000ML W/SPOUT (MISCELLANEOUS) ×2 IMPLANT
CATH FOLEY 2WAY  5CC 16FR (CATHETERS)
CATH FOLEY 2WAY 5CC 16FR (CATHETERS)
CATH URTH 16FR FL 2W BLN LF (CATHETERS) ×1 IMPLANT
CORD URO TURP 10FT (MISCELLANEOUS) ×2 IMPLANT
ELECT LOOP MED HF 24F 12D (CUTTING LOOP) ×1 IMPLANT
ELECT URO BUGBEE 5F (MISCELLANEOUS) ×2
ELECTRODE URO BUGBEE 5F (MISCELLANEOUS) ×1 IMPLANT
GLOVE BIO SURGEON STRL SZ 6.5 (GLOVE) ×2 IMPLANT
GLOVE BIO SURGEON STRL SZ7 (GLOVE) ×4 IMPLANT
GOWN STRL REUS W/ TWL LRG LVL3 (GOWN DISPOSABLE) ×2 IMPLANT
GOWN STRL REUS W/TWL LRG LVL3 (GOWN DISPOSABLE) ×4
JELLY LUB 2OZ STRL (MISCELLANEOUS) ×1
JELLY LUBE 2OZ STRL (MISCELLANEOUS) ×1 IMPLANT
KIT RM TURNOVER CYSTO AR (KITS) ×2 IMPLANT
LOOP CUT RT ANGL 28F (MISCELLANEOUS) ×1 IMPLANT
PACK CYSTO AR (MISCELLANEOUS) ×2 IMPLANT
PAD GROUND ADULT SPLIT (MISCELLANEOUS) ×2 IMPLANT
PREP PVP WINGED SPONGE (MISCELLANEOUS) ×2 IMPLANT
ROLLER BALL 3MM 24FR (ELECTROSURGICAL) ×1 IMPLANT
SET IRRIG Y TYPE TUR BLADDER L (SET/KITS/TRAYS/PACK) ×2 IMPLANT
SYRINGE IRR TOOMEY STRL 70CC (SYRINGE) ×2 IMPLANT
WATER STERILE IRR 1000ML POUR (IV SOLUTION) ×2 IMPLANT
WATER STERILE IRR 3000ML UROMA (IV SOLUTION) ×4 IMPLANT

## 2014-11-20 NOTE — Transfer of Care (Signed)
Immediate Anesthesia Langston Masker of Care Note  Patient: Barbara Hernandez  Procedure(s) Performed: Procedure(s): TRANSURETHRAL RESECTION OF BLADDER TUMOR (TURBT) (N/A)  Patient Location: PACU  Anesthesia Type:General  Level of Consciousness: sedated  Airway & Oxygen Therapy: Patient Spontanous Breathing and Patient connected to face mask oxygen  Post-op Assessment: Report given to RN  Post vital signs: Reviewed and stable  Last Vitals:  Filed Vitals:   11/20/14 1101  BP: 141/73  Pulse: 63  Temp: 36.9 C  Resp: 16    Complications: No apparent anesthesia complications

## 2014-11-20 NOTE — Op Note (Signed)
Date of procedure: 11/20/2014  Preoperative diagnosis:  1. Bladder tumor   Postoperative diagnosis:  1. Bladder tumor   Procedure: 1. TURBT (small)  Surgeon: Hollice Espy, MD  Anesthesia: General  Complications: None  Intraoperative findings: 1 cm left lateral wall bladder tumor just beyond left UO. 2 right ureteral orifice easily identified.  EBL: Minimal    Specimens: Bladder tumor (left lateral wall)  Drains: None  Indication: Barbara Hernandez is a 74 y.o. patient with gross hematuria who underwent workup including CT urogram and cystoscopy. Cystoscopy revealed showed a small papillary bladder tumor just beyond the left UO. There were otherwise no bladder tumors identified..  After reviewing the management options for treatment, he elected to proceed with the above surgical procedure(s). We have discussed the potential benefits and risks of the procedure, side effects of the proposed treatment, the likelihood of the patient achieving the goals of the procedure, and any potential problems that might occur during the procedure or recuperation. Informed consent has been obtained.  Description of procedure:  The patient was taken to the operating room and general anesthesia was induced.  The patient was placed in the dorsal lithotomy position, prepped and draped in the usual sterile fashion, and preoperative antibiotics were administered. A preoperative time-out was performed.   A 22 French rigid cystoscope was advanced per urethra into the bladder. The entire surface of the bladder was inspected and there were no other tumors identified other than the 1 cm papillary flat bladder tumor located just be on the left UO on the left lateral wall of the bladder. Of note, inspection of the trigone revealed 2 individual right ureteral orificies.  There was a single right UO.  There were no other bladder stones, lesions, or ulcerations noted. Cold cup biopsy forceps were then used to  piecewise resect the bladder tumor. A total of 7 or 8 biopsy bites were taken down to the level of the muscle fibers which were identified. These were passed off for permanent section. The base of the tumor bed was then fulgurated using Bugbee electrocautery. Hemostasis was excellent. Fulguration was nowhere near the left UO which remained patent. The bladder was then drained.  The patient was then repositioned in the supine position, reversed from anesthesia, taken to the PACU in stable condition. There were no complications in this case.  Hollice Espy, M.D.

## 2014-11-20 NOTE — Discharge Instructions (Signed)
Transurethral Resection of Bladder Tumor (TURBT) or Bladder Biopsy   Definition:  Transurethral Resection of the Bladder Tumor is a surgical procedure used to diagnose and remove tumors within the bladder. TURBT is the most common treatment for early stage bladder cancer.  General instructions:     Your recent bladder surgery requires very little post hospital care but some definite precautions.  Despite the fact that no skin incisions were used, the area around the bladder incisions are raw and covered with scabs to promote healing and prevent bleeding. Certain precautions are needed to insure that the scabs are not disturbed over the next 2-4 weeks while the healing proceeds.  Because the raw surface inside your bladder and the irritating effects of urine you may expect frequency of urination and/or urgency (a stronger desire to urinate) and perhaps even getting up at night more often. This will usually resolve or improve slowly over the healing period. You may see some blood in your urine over the first 6 weeks. Do not be alarmed, even if the urine was clear for a while. Get off your feet and drink lots of fluids until clearing occurs. If you start to pass clots or don't improve call us.  Diet:  You may return to your normal diet immediately. Because of the raw surface of your bladder, alcohol, spicy foods, foods high in acid and drinks with caffeine may cause irritation or frequency and should be used in moderation. To keep your urine flowing freely and avoid constipation, drink plenty of fluids during the day (8-10 glasses). Tip: Avoid cranberry juice because it is very acidic.  Activity:  Your physical activity doesn't need to be restricted. However, if you are very active, you may see some blood in the urine. We suggest that you reduce your activity under the circumstances until the bleeding has stopped.  Bowels:  It is important to keep your bowels regular during the postoperative  period. Straining with bowel movements can cause bleeding. A bowel movement every other day is reasonable. Use a mild laxative if needed, such as milk of magnesia 2-3 tablespoons, or 2 Dulcolax tablets. Call if you continue to have problems. If you had been taking narcotics for pain, before, during or after your surgery, you may be constipated. Take a laxative if necessary.    Medication:  You should resume your pre-surgery medications unless told not to. In addition you may be given an antibiotic to prevent or treat infection. Antibiotics are not always necessary. All medication should be taken as prescribed until the bottles are finished unless you are having an unusual reaction to one of the drugs.   [facility] [address] [place], [place] [address] [phone]    AMBULATORY SURGERY  DISCHARGE INSTRUCTIONS   1) The drugs that you were given will stay in your system until tomorrow so for the next 24 hours you should not:  A) Drive an automobile B) Make any legal decisions C) Drink any alcoholic beverage   2) You may resume regular meals tomorrow.  Today it is better to start with liquids and gradually work up to solid foods.  You may eat anything you prefer, but it is better to start with liquids, then soup and crackers, and gradually work up to solid foods.   3) Please notify your doctor immediately if you have any unusual bleeding, trouble breathing, redness and pain at the surgery site, drainage, fever, or pain not relieved by medication.  4) Your  post-operative visit with Dr.                                     is: Date:                        Time:

## 2014-11-20 NOTE — Anesthesia Procedure Notes (Signed)
Procedure Name: LMA Insertion Date/Time: 11/20/2014 1:10 PM Performed by: Delaney Meigs Pre-anesthesia Checklist: Patient identified, Emergency Drugs available, Suction available, Patient being monitored and Timeout performed Patient Re-evaluated:Patient Re-evaluated prior to inductionOxygen Delivery Method: Circle system utilized and Simple face mask Preoxygenation: Pre-oxygenation with 100% oxygen Intubation Type: IV induction LMA: LMA inserted LMA Size: 3.5 Number of attempts: 1 Placement Confirmation: positive ETCO2 and breath sounds checked- equal and bilateral Tube secured with: Tape

## 2014-11-20 NOTE — Interval H&P Note (Signed)
History and Physical Interval Note:  11/20/2014 1:08 PM  Barbara Hernandez  has presented today for surgery, with the diagnosis of BLADDER TUMOR  The various methods of treatment have been discussed with the patient and family. After consideration of risks, benefits and other options for treatment, the patient has consented to  Procedure(s): TRANSURETHRAL RESECTION OF BLADDER TUMOR (TURBT) (N/A) as a surgical intervention .  The patient's history has been reviewed, patient examined, no change in status, stable for surgery.  I have reviewed the patient's chart and labs.  Questions were answered to the patient's satisfaction.     Hollice Espy

## 2014-11-20 NOTE — Anesthesia Preprocedure Evaluation (Signed)
Anesthesia Evaluation  Patient identified by MRN, date of birth, ID band Patient awake    Reviewed: Allergy & Precautions, H&P , NPO status , Patient's Chart, lab work & pertinent test results, reviewed documented beta blocker date and time   Airway Mallampati: II  TM Distance: >3 FB Neck ROM: full    Dental   Pulmonary sleep apnea , Current Smoker,          Cardiovascular hypertension, + Valvular Problems/Murmurs Rate:Normal     Neuro/Psych    GI/Hepatic GERD-  ,  Endo/Other  Hypothyroidism   Renal/GU      Musculoskeletal   Abdominal   Peds  Hematology  (+) anemia ,   Anesthesia Other Findings   Reproductive/Obstetrics                             Anesthesia Physical Anesthesia Plan  ASA: III  Anesthesia Plan: General LMA   Post-op Pain Management:    Induction:   Airway Management Planned:   Additional Equipment:   Intra-op Plan:   Post-operative Plan:   Informed Consent: I have reviewed the patients History and Physical, chart, labs and discussed the procedure including the risks, benefits and alternatives for the proposed anesthesia with the patient or authorized representative who has indicated his/her understanding and acceptance.     Plan Discussed with: CRNA  Anesthesia Plan Comments:         Anesthesia Quick Evaluation

## 2014-11-20 NOTE — Anesthesia Postprocedure Evaluation (Signed)
  Anesthesia Post-op Note  Patient: Barbara Hernandez  Procedure(s) Performed: Procedure(s): TRANSURETHRAL RESECTION OF BLADDER TUMOR (TURBT) (N/A)  Anesthesia type:General LMA  Patient location: PACU  Post pain: Pain level controlled  Post assessment: Post-op Vital signs reviewed, Patient's Cardiovascular Status Stable, Respiratory Function Stable, Patent Airway and No signs of Nausea or vomiting  Post vital signs: Reviewed and stable  Last Vitals:  Filed Vitals:   11/20/14 1410  BP: 125/77  Pulse: 85  Temp:   Resp: 15    Level of consciousness: awake, alert  and patient cooperative  Complications: No apparent anesthesia complications

## 2014-11-21 LAB — SURGICAL PATHOLOGY

## 2014-11-22 ENCOUNTER — Telehealth: Payer: Self-pay | Admitting: Urology

## 2014-11-22 ENCOUNTER — Other Ambulatory Visit: Payer: Self-pay

## 2014-11-22 DIAGNOSIS — H02839 Dermatochalasis of unspecified eye, unspecified eyelid: Secondary | ICD-10-CM | POA: Insufficient documentation

## 2014-11-22 DIAGNOSIS — H5340 Unspecified visual field defects: Secondary | ICD-10-CM | POA: Insufficient documentation

## 2014-11-22 DIAGNOSIS — M1990 Unspecified osteoarthritis, unspecified site: Secondary | ICD-10-CM | POA: Insufficient documentation

## 2014-11-22 NOTE — Telephone Encounter (Signed)
Called to discuss pathology results with patient. 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  I discussed that with the patient that this is a very low likelihood of recurrence and is not considered to be cancer although some data indicates that there is some evidence to perform interval cystoscopy.  We discussed proceeding with follow-up cystoscopy in 4-6 months. We will discuss the interval of surveillance cystoscopy thereafter and next visit.  Hollice Espy, MD

## 2014-11-25 ENCOUNTER — Ambulatory Visit (INDEPENDENT_AMBULATORY_CARE_PROVIDER_SITE_OTHER): Payer: Medicare Other | Admitting: Family Medicine

## 2014-11-25 ENCOUNTER — Encounter: Payer: Self-pay | Admitting: Family Medicine

## 2014-11-25 VITALS — BP 140/80 | HR 56 | Temp 97.7°F | Resp 16 | Ht 62.0 in | Wt 203.0 lb

## 2014-11-25 DIAGNOSIS — E7800 Pure hypercholesterolemia, unspecified: Secondary | ICD-10-CM

## 2014-11-25 DIAGNOSIS — I10 Essential (primary) hypertension: Secondary | ICD-10-CM | POA: Diagnosis not present

## 2014-11-25 DIAGNOSIS — R319 Hematuria, unspecified: Secondary | ICD-10-CM

## 2014-11-25 DIAGNOSIS — D510 Vitamin B12 deficiency anemia due to intrinsic factor deficiency: Secondary | ICD-10-CM

## 2014-11-25 DIAGNOSIS — E559 Vitamin D deficiency, unspecified: Secondary | ICD-10-CM

## 2014-11-25 DIAGNOSIS — E038 Other specified hypothyroidism: Secondary | ICD-10-CM

## 2014-11-25 DIAGNOSIS — Z0000 Encounter for general adult medical examination without abnormal findings: Secondary | ICD-10-CM

## 2014-11-25 NOTE — Progress Notes (Signed)
Patient ID: Nyhla Mountjoy, female   DOB: [birth year], 74 y.o.   MRN: [id number] Patient: Lamees Gable, Female    DOB: Nov 24, 1940, 74 y.o.   MRN: [id number] Visit Date: 11/25/2014  Today's Provider: Margarita Rana, MD   Chief Complaint  Patient presents with  . Medicare Wellness   Subjective:   Virgina Deakins is a 74 y.o. female who presents today for her Subsequent Annual Wellness Visit. She feels well. She reports she stays very active at home. She reports she is sleeping well.  Last CPE 11/23/2013 01/16/2013 Colonoscopy-diverticulosis (Dr. Bary Castilla) 02/27/2014 Mammogram-BI-RADS 1   Review of Systems  Constitutional: Negative.   HENT: Negative.   Eyes: Negative.   Respiratory: Positive for apnea.   Cardiovascular: Negative.   Gastrointestinal: Negative.   Endocrine: Negative.   Genitourinary: Negative.   Musculoskeletal: Positive for arthralgias.  Skin: Negative.   Allergic/Immunologic: Negative.   Neurological: Negative.   Hematological: Negative.   Psychiatric/Behavioral: Negative.     History   Social History  . Marital Status: Widowed    Spouse Name: N/A  . Number of Children: 2  . Years of Education: N/A   Occupational History  . Retired    Social History Main Topics  . Smoking status: Former Smoker -- 0.50 packs/day for 20 years    Types: Cigarettes    Quit date: 06/20/1977  . Smokeless tobacco: Never Used  . Alcohol Use: 0.6 - 1.8 oz/week    0-1 Glasses of wine, 1-2 Standard drinks or equivalent per week  . Drug Use: No  . Sexual Activity: Not on file   Other Topics Concern  . Not on file   Social History Narrative    Patient Active Problem List   Diagnosis Date Noted  . 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 Arthritis, degenerative 11/22/2014  . VFD (visual field defect) 11/22/2014  . Hypothyroidism 10/08/2014  . Barrett's esophagus 10/08/2014  . Hypercholesteremia 10/08/2014  . Hypertension 10/08/2014  . Hematuria 10/08/2014  .  Onychomycosis 10/08/2014  . Mild left ventricular hypertrophy 10/08/2014  . Pernicious anemia 10/08/2014  . Vitamin D deficiency 10/08/2014  . Vertigo, benign paroxysmal 10/08/2014  . Systolic murmur 01/60/1093  . Sleep apnea 10/08/2014  . Arthritis of shoulder region, right, degenerative 10/08/2014  . Allergic rhinitis 10/08/2014  . 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 MI (mitral incompetence) 08/14/2014  . Anemia 01/01/2013  . Anemia, iron deficiency 09/07/2012  . GERD (gastroesophageal reflux disease) 09/07/2012  . Hemorrhoid     Past Surgical History  Procedure Laterality Date  . Colonoscopy w/ biopsies N/A 01/16/2013    No source for GI blood loss, mild lymphatic prominence in the rectum.  . Left wrist fracture    . Breast biopsy Left 1988  . Eye surgery Bilateral 2006    cataract  . Blepharoplasty Bilateral 2015  . Transurethral resection of bladder tumor N/A 11/20/2014    Procedure: TRANSURETHRAL RESECTION OF BLADDER TUMOR (TURBT);  Surgeon: Hollice Espy, MD;  Location: [facility] ORS;  Service: Urology;  Laterality: N/A;    Her family history includes Anxiety disorder in her brother and sister; Asthma in her brother; Barrett's esophagus in her sister; Cancer in her father, mother, and sister; Diabetes in her brother; Fibromyalgia in her sister; Heart disease in her brother, father, mother, and sister; Hyperlipidemia in her sister; Hypertension in her mother; Mental illness in her mother and sister; Neuropathy in her brother and sister.    Previous Medications   ASPIRIN 81 MG TABLET  Take 81 mg by mouth daily. bedtime   CELEBREX 200 MG CAPSULE    Take 1 capsule by mouth daily. am   CHOLECALCIFEROL (VITAMIN D3) 2000 UNITS TABS    Take 1 tablet by mouth daily. bedtime   DOCUSATE SODIUM 100 MG CAPSULE    Take 100 mg by mouth daily. bedtime   GLUCOSAMINE-CHONDROITIN 500-400 MG TABLET    Take 1 tablet by mouth 2 (two) times daily.   HYDROCHLOROTHIAZIDE (HYDRODIURIL) 12.5 MG  TABLET    Take 1 tablet by mouth daily. am   HYDROCODONE-ACETAMINOPHEN (NORCO/VICODIN) 5-325 MG PER TABLET    Take 1-2 tablets by mouth every 6 (six) hours as needed.   HYDROCORTISONE (ANUSOL-HC) 25 MG SUPPOSITORY       LEVOTHYROXINE (SYNTHROID, LEVOTHROID) 112 MCG TABLET    Take 112 mcg by mouth daily before breakfast.   LISINOPRIL (PRINIVIL,ZESTRIL) 40 MG TABLET    Take 1 tablet by mouth daily. bedtime   NITROFURANTOIN, MACROCRYSTAL-MONOHYDRATE, (MACROBID) 100 MG CAPSULE    Take 100 mg by mouth 2 (two) times daily.   OMEPRAZOLE (PRILOSEC) 20 MG CAPSULE    Take 1 capsule by mouth daily.   OXYBUTYNIN (DITROPAN) 5 MG TABLET    Take 1 tablet (5 mg total) by mouth every 8 (eight) hours as needed for bladder spasms.   PHENAZOPYRIDINE (PYRIDIUM) 200 MG TABLET    Take 1 tablet (200 mg total) by mouth 3 (three) times daily as needed for pain.   POLYETHYLENE GLYCOL POWDER (GLYCOLAX/MIRALAX) POWDER    Take 17 g by mouth daily.   RANITIDINE (ZANTAC) 150 MG TABLET    Take 150 mg by mouth 2 (two) times daily as needed for heartburn.   SIMVASTATIN (ZOCOR) 20 MG TABLET    Take 20 mg by mouth daily. bedtime   SYNTHROID 125 MCG TABLET    Take 112 mcg by mouth daily. Am   VITAMIN B-12 (CYANOCOBALAMIN) 1000 MCG TABLET    Take 1,000 mcg by mouth daily. am    Patient Care Team: Margarita Rana, MD as PCP - General (Family Medicine)     Objective:   Vitals: BP 140/80 mmHg  Pulse 56  Temp(Src) 97.7 F (36.5 C) (Oral)  Resp 16  Ht _0  (1.575 m)  Wt 203 lb (92.08 kg)  BMI 37.12 kg/m2  SpO2 97%  Physical Exam  Constitutional: She is oriented to person, place, and time. She appears well-developed and well-nourished.  HENT:  Head: Normocephalic and atraumatic.  Right Ear: Tympanic membrane, external ear and ear canal normal.  Left Ear: Tympanic membrane, external ear and ear canal normal.  Nose: Nose normal.  Mouth/Throat: Uvula is midline, oropharynx is clear and moist and mucous membranes are normal.   Eyes: Conjunctivae, EOM and lids are normal. Pupils are equal, round, and reactive to light.  Neck: Trachea normal and normal range of motion. Neck supple. Carotid bruit is not present. No thyroid mass and no thyromegaly present.  Cardiovascular: Normal rate, regular rhythm and normal heart sounds.   Pulmonary/Chest: Effort normal and breath sounds normal.  Abdominal: Soft. Normal appearance and bowel sounds are normal. There is no hepatosplenomegaly. There is no tenderness.  Genitourinary: No breast swelling, tenderness or discharge.  Musculoskeletal: Normal range of motion.  Lymphadenopathy:    She has no cervical adenopathy.    She has no axillary adenopathy.  Neurological: She is alert and oriented to person, place, and time. She has normal strength. No cranial nerve deficit.  Skin: Skin is warm,  dry and intact.  Psychiatric: She has a normal mood and affect. Her speech is normal and behavior is normal. Judgment and thought content normal. Cognition and memory are normal.    Activities of Daily Living In your present state of health, do you have any difficulty performing the following activities: 11/25/2014 11/14/2014  Hearing? N N  Vision? N N  Difficulty concentrating or making decisions? N N  Walking or climbing stairs? N N  Dressing or bathing? N N  Doing errands, shopping? N N    Fall Risk Assessment Fall Risk  11/25/2014  Falls in the past year? No     Depression Screen PHQ 2/9 Scores 11/25/2014  PHQ - 2 Score 0    Cognitive Testing - 6-CIT    Year: 0 4 points  Month: 0 3 points  Memorize "Pia Mau, [address], [place]"  Time (within 1 hour:) 0 3 points  Count backwards from 20: 0 2 4 points  Name months of year: 0 2 4 points  Repeat Address: 0 _0 points   Total Score: 3/28  Interpretation : Normal (0-7) Abnormal (8-28)    Assessment & Plan:    1. Medicare annual wellness visit, subsequent    Healthy.  - DG Bone Density - MM Digital  Screening - Ambulatory referral to Dermatology   2. Other specified hypothyroidism    Labs pending. Patient advised to continue current medication.  - TSH  3. Hypercholesteremia    Labs pending.  - Comp Met (CMET) - Lipid Panel With LDL/HDL Ratio  4. Pernicious anemia    Labs pending.  - CBC w/Diff  5. Essential hypertension    Labs pending.   7. Vitamin D deficiency    Labs pending.  - Vitamin D (25 hydroxy)    Annual Wellness Visit  Reviewed patient's Family Medical History Reviewed and updated list of patient's medical providers Assessment of cognitive impairment was done Assessed patient's functional ability Established a written schedule for health screening services Health Risk Assessment Completed and Reviewed  Exercise Activities and Dietary recommendations Goals    None      Immunization History  Administered Date(s) Administered  . Pneumococcal Conjugate-13 11/23/2013  . Pneumococcal Polysaccharide-23 11/13/2010  . Tdap 11/05/2005  . Zoster 07/31/2010    Health Maintenance  Topic Date Due  . DEXA SCAN  05/21/2008  . INFLUENZA VACCINE  01/20/2015  . TETANUS/TDAP  11/06/2015  . MAMMOGRAM  02/28/2016  . COLONOSCOPY  01/17/2023  . ZOSTAVAX  Completed  . PNA vac Low Risk Adult  Completed      Discussed health benefits of physical activity, and encouraged her to engage in regular exercise appropriate for her age and condition.    ------------------------------------------------------------------------------------------------------------   Patient seen and examined by Dr. Jerrell Belfast, and note scribed by Philbert Riser. Sahmya Arai, CMA.

## 2014-12-03 ENCOUNTER — Ambulatory Visit: Payer: Medicare Other | Admitting: Urology

## 2014-12-13 LAB — COMPREHENSIVE METABOLIC PANEL
ALBUMIN: 4.2 g/dL (ref 3.5–4.8)
ALT: 13 IU/L (ref 0–32)
AST: 17 IU/L (ref 0–40)
Albumin/Globulin Ratio: 1.9 (ref 1.1–2.5)
Alkaline Phosphatase: 90 IU/L (ref 39–117)
BUN/Creatinine Ratio: 19 (ref 11–26)
BUN: 13 mg/dL (ref 8–27)
Bilirubin Total: 0.7 mg/dL (ref 0.0–1.2)
CALCIUM: 9.5 mg/dL (ref 8.7–10.3)
CHLORIDE: 95 mmol/L — AB (ref 97–108)
CO2: 27 mmol/L (ref 18–29)
CREATININE: 0.68 mg/dL (ref 0.57–1.00)
GFR calc Af Amer: 102 mL/min/{1.73_m2} (ref >59)
GFR calc non Af Amer: 88 mL/min/{1.73_m2} (ref >59)
GLOBULIN, TOTAL: 2.2 g/dL (ref 1.5–4.5)
GLUCOSE: 98 mg/dL (ref 65–99)
Potassium: 4.7 mmol/L (ref 3.5–5.2)
Sodium: 134 mmol/L (ref 134–144)
TOTAL PROTEIN: 6.4 g/dL (ref 6.0–8.5)

## 2014-12-13 LAB — CBC WITH DIFFERENTIAL/PLATELET
Basophils Absolute: 0 10*3/uL (ref 0.0–0.2)
Basos: 1 %
EOS (ABSOLUTE): 0.2 10*3/uL (ref 0.0–0.4)
EOS: 3 %
HEMOGLOBIN: 12.4 g/dL (ref 11.1–15.9)
Hematocrit: 38.7 % (ref 34.0–46.6)
Immature Grans (Abs): 0 10*3/uL (ref 0.0–0.1)
Immature Granulocytes: 0 %
LYMPHS ABS: 0.8 10*3/uL (ref 0.7–3.1)
Lymphs: 18 %
MCH: 28.1 pg (ref 26.6–33.0)
MCHC: 32 g/dL (ref 31.5–35.7)
MCV: 88 fL (ref 79–97)
MONOS ABS: 0.5 10*3/uL (ref 0.1–0.9)
Monocytes: 10 %
Neutrophils Absolute: 3.2 10*3/uL (ref 1.4–7.0)
Neutrophils: 68 %
Platelets: 266 10*3/uL (ref 150–379)
RBC: 4.41 x10E6/uL (ref 3.77–5.28)
RDW: 15 % (ref 12.3–15.4)
WBC: 4.7 10*3/uL (ref 3.4–10.8)

## 2014-12-13 LAB — LIPID PANEL WITH LDL/HDL RATIO
Cholesterol, Total: 163 mg/dL (ref 100–199)
HDL: 82 mg/dL (ref >39)
LDL CALC: 69 mg/dL (ref 0–99)
LDl/HDL Ratio: 0.8 ratio units (ref 0.0–3.2)
TRIGLYCERIDES: 59 mg/dL (ref 0–149)
VLDL Cholesterol Cal: 12 mg/dL (ref 5–40)

## 2014-12-13 LAB — TSH: TSH: 0.427 u[IU]/mL — ABNORMAL LOW (ref 0.450–4.500)

## 2014-12-13 LAB — VITAMIN D 25 HYDROXY (VIT D DEFICIENCY, FRACTURES): VIT D 25 HYDROXY: 42.4 ng/mL (ref 30.0–100.0)

## 2014-12-16 ENCOUNTER — Telehealth: Payer: Self-pay

## 2014-12-16 NOTE — Telephone Encounter (Signed)
Advised pt of lab results. Pt verbally acknowledges understanding. Pt states she does NOT want to change current dose. Reports she is feeling well, and agrees to call in 6 weeks to recheck TSH. FYI pt wanted to inform you that she D/C Zantac due to ineffectiveness, and restarted Omeprazole. Made changes in chart. Renaldo Fiddler, CMA

## 2014-12-16 NOTE — Telephone Encounter (Signed)
-----   Message from Margarita Rana, MD sent at 12/15/2014 10:22 PM EDT ----- Labs stable except thyroid is slightly overcorrected. How are you feeling? If doing well, would recommend recheck TSH and T4  In 6 weeks. If feeling anxious, unexplained weight  Loss or palpitations, would recommend decrease medication.  Thanks- Dr. Jerilynn Mages.

## 2014-12-16 NOTE — Telephone Encounter (Signed)
LMTCB Rivka Baune Drozdowski, CMA  

## 2014-12-17 ENCOUNTER — Ambulatory Visit: Payer: Self-pay | Admitting: Urology

## 2014-12-20 ENCOUNTER — Ambulatory Visit: Payer: Self-pay | Admitting: Urology

## 2015-01-29 ENCOUNTER — Telehealth: Payer: Self-pay | Admitting: Family Medicine

## 2015-01-29 DIAGNOSIS — E038 Other specified hypothyroidism: Secondary | ICD-10-CM

## 2015-01-29 NOTE — Telephone Encounter (Signed)
Pt requesting lab order to have her thyroid rechecked.  Thanks, Con Memos

## 2015-01-29 NOTE — Telephone Encounter (Signed)
Advised patient

## 2015-01-29 NOTE — Telephone Encounter (Signed)
Printed.  Please notify patient.

## 2015-01-29 NOTE — Telephone Encounter (Signed)
Last ov 06/28/14 last lab checked on 06/26/13 TSH 4.670

## 2015-02-05 ENCOUNTER — Telehealth: Payer: Self-pay

## 2015-02-05 LAB — T4 AND TSH
T4, Total: 7.7 ug/dL (ref 4.5–12.0)
TSH: 1.35 u[IU]/mL (ref 0.450–4.500)

## 2015-02-05 NOTE — Telephone Encounter (Signed)
-----   Message from Margarita Rana, MD sent at 02/05/2015  7:38 AM EDT ----- Thyroid in normal range. Please notify patient to continue current dose. Thanks.

## 2015-02-05 NOTE — Telephone Encounter (Signed)
Patient advised as directed below.  Thanks,  -Maelys Kinnick 

## 2015-03-18 DIAGNOSIS — I10 Essential (primary) hypertension: Secondary | ICD-10-CM | POA: Insufficient documentation

## 2015-03-26 ENCOUNTER — Ambulatory Visit
Admission: RE | Admit: 2015-03-26 | Discharge: 2015-03-26 | Disposition: A | Discharge status: Home / Self Care | Payer: Medicare Other | Source: Ambulatory Visit | Attending: Family Medicine | Admitting: Family Medicine

## 2015-03-26 DIAGNOSIS — M85861 Other specified disorders of bone density and structure, right lower leg: Secondary | ICD-10-CM | POA: Insufficient documentation

## 2015-03-26 DIAGNOSIS — Z13820 Encounter for screening for osteoporosis: Secondary | ICD-10-CM | POA: Diagnosis present

## 2015-03-26 DIAGNOSIS — Z1231 Encounter for screening mammogram for malignant neoplasm of breast: Secondary | ICD-10-CM | POA: Diagnosis not present

## 2015-03-27 ENCOUNTER — Other Ambulatory Visit: Payer: Self-pay | Admitting: Family Medicine

## 2015-03-27 ENCOUNTER — Telehealth: Payer: Self-pay

## 2015-03-27 NOTE — Telephone Encounter (Signed)
-----   Message from Margarita Rana, MD sent at 03/27/2015  6:47 AM EDT ----- Bone thinning but not osteoporosis.  Recommend weight bearing exercise and continue Vitamin D.   Recheck in 2 years. Thanks.

## 2015-03-27 NOTE — Telephone Encounter (Signed)
Patient advised as directed below.  Thanks,  -Nautica Hotz 

## 2015-04-15 ENCOUNTER — Other Ambulatory Visit: Payer: Self-pay

## 2015-04-15 DIAGNOSIS — M1990 Unspecified osteoarthritis, unspecified site: Secondary | ICD-10-CM

## 2015-04-15 DIAGNOSIS — K219 Gastro-esophageal reflux disease without esophagitis: Secondary | ICD-10-CM

## 2015-04-15 MED ORDER — OMEPRAZOLE 20 MG PO CPDR: 20.0000 mg | DELAYED_RELEASE_CAPSULE | Freq: Every day | ORAL | Status: AC

## 2015-04-15 MED ORDER — CELECOXIB 200 MG PO CAPS: 200.0000 mg | ORAL_CAPSULE | Freq: Every day | ORAL | Status: DC

## 2015-04-15 NOTE — Telephone Encounter (Signed)
Pharmacy requesting refill.

## 2015-05-13 ENCOUNTER — Other Ambulatory Visit: Payer: Self-pay

## 2015-05-13 DIAGNOSIS — E7800 Pure hypercholesterolemia, unspecified: Secondary | ICD-10-CM

## 2015-05-13 DIAGNOSIS — I10 Essential (primary) hypertension: Secondary | ICD-10-CM

## 2015-05-13 MED ORDER — HYDROCHLOROTHIAZIDE 12.5 MG PO TABS: 12.5000 mg | ORAL_TABLET | Freq: Every day | ORAL | Status: DC

## 2015-05-13 MED ORDER — SIMVASTATIN 20 MG PO TABS: 20.0000 mg | ORAL_TABLET | Freq: Every day | ORAL | Status: AC

## 2015-05-13 MED ORDER — LISINOPRIL 40 MG PO TABS: 40.0000 mg | ORAL_TABLET | Freq: Every day | ORAL | Status: AC

## 2015-05-13 NOTE — Telephone Encounter (Signed)
Pharmacy requesting refill. Patient's last ov was 11/25/2014. Meds last refilled on 04/15/2014 #90 with 3 refills.

## 2015-05-30 ENCOUNTER — Ambulatory Visit (INDEPENDENT_AMBULATORY_CARE_PROVIDER_SITE_OTHER): Payer: Medicare Other | Admitting: Urology

## 2015-05-30 ENCOUNTER — Encounter: Payer: Self-pay | Admitting: Urology

## 2015-05-30 VITALS — Ht 63.0 in | Wt 209.3 lb

## 2015-05-30 DIAGNOSIS — D414 Neoplasm of uncertain behavior of bladder: Secondary | ICD-10-CM

## 2015-05-30 LAB — URINALYSIS, COMPLETE
Bilirubin, UA: NEGATIVE
Glucose, UA: NEGATIVE
Ketones, UA: NEGATIVE
Leukocytes, UA: NEGATIVE
NITRITE UA: NEGATIVE
Protein, UA: NEGATIVE
Specific Gravity, UA: 1.01 (ref 1.005–1.030)
Urobilinogen, Ur: 0.2 mg/dL (ref 0.2–1.0)
pH, UA: 6.5 (ref 5.0–7.5)

## 2015-05-30 LAB — MICROSCOPIC EXAMINATION
Bacteria, UA: NONE SEEN
Epithelial Cells (non renal): NONE SEEN /hpf (ref 0–10)
RBC MICROSCOPIC, UA: NONE SEEN /HPF (ref 0–?)
WBC, UA: NONE SEEN /hpf (ref 0–?)

## 2015-05-30 MED ORDER — LIDOCAINE HCL 2 % EX GEL
1.0000 "application " | Freq: Once | CUTANEOUS | Status: AC
  Administered 2015-05-30: 1 via URETHRAL

## 2015-05-30 MED ORDER — CIPROFLOXACIN HCL 500 MG PO TABS
500.0000 mg | ORAL_TABLET | Freq: Once | ORAL | Status: AC
  Administered 2015-05-30: 500 mg via ORAL

## 2015-05-30 NOTE — Progress Notes (Signed)
05/30/2015 11:09 AM   Zacarias Pontes Earney [place] 1941-04-14 [id number]  Referring provider: Margarita Rana, MD [address] [place], [place] [address]  Chief Complaint  Patient presents with  . Cysto    HPI: 74-female who initially presented with gross hematuria who underwent hematuria workup including CT urogram on 4 2016 as well as cystoscopy revealing a small 1 cm tumor just beyond the left UO. She underwent TURBT in 10/2013 with pathology consistent with PUNLMP.  She has a remote history of urinary tract infections after first being married but none since that time. She does report around that time, she did have a cystoscopy and some lesions in her bladder burned. She has not had any problems since then until the aforementioned lesion was identified.  She was a former smoker, smoked 15 years, 1 pack per day and quit in her mid 30s.  10/15/14 CT Urogram IMPRESSION: No renal, ureteral, or bladder calculi. No hydronephrosis. No enhancing renal lesions. No filling defects in the bilateral opacified proximal collecting systems, ureters, or bladder.  She returns today to the office for routine surveillance cystoscopy.    PMH: Past Medical History  Diagnosis Date  . Hemorrhoid   . Thyroid disease   . Reflux   . Arthritis   . Joint pain   . GERD (gastroesophageal reflux disease)   . Barrett's esophagus 2012    2010 upper endoscopy showed only reflux. 2012 biopsies suggested Barrett's epithelial changes.  . Anemia   . Hematuria   . Hemorrhoids   . Reflux esophagitis   . Hypothyroidism   . Hyperlipidemia   . Hypertension   . Onychomycosis   . Mild left ventricular hypertrophy   . H/O measles   . History of chicken pox   . BPPV (benign paroxysmal positional vertigo) 2004  . Sleep apnea 2011  . Family history of adverse reaction to anesthesia     sister had memory problems after delivery of child  . Bladder tumor   . Hematuria     Surgical History: Past  Surgical History  Procedure Laterality Date  . Colonoscopy w/ biopsies N/A 01/16/2013    No source for GI blood loss, mild lymphatic prominence in the rectum.  . Left wrist fracture    . Eye surgery Bilateral 2006    cataract  . Blepharoplasty Bilateral 2015  . Transurethral resection of bladder tumor N/A 11/20/2014    Procedure: TRANSURETHRAL RESECTION OF BLADDER TUMOR (TURBT);  Surgeon: Hollice Espy, MD;  Location: [facility] ORS;  Service: Urology;  Laterality: N/A;  . Breast biopsy Left 1988    Home Medications:    Medication List       This list is accurate as of: 05/30/15 11:09 AM.  Always use your most recent med list.               aspirin 81 MG tablet  Take 81 mg by mouth daily. bedtime     celecoxib 200 MG capsule  Commonly known as:  CELEBREX  Take 1 capsule (200 mg total) by mouth daily. am     Docusate Sodium 100 MG capsule  Take 100 mg by mouth daily. bedtime     glucosamine-chondroitin 500-400 MG tablet  Take 1 tablet by mouth 2 (two) times daily.     hydrochlorothiazide 12.5 MG tablet  Commonly known as:  HYDRODIURIL  Take 1 tablet (12.5 mg total) by mouth daily. am     lisinopril 40 MG tablet  Commonly known as:  PRINIVIL,ZESTRIL  Take 1 tablet (40 mg total) by mouth daily. bedtime     omeprazole 20 MG capsule  Commonly known as:  PRILOSEC  Take 1 capsule (20 mg total) by mouth daily.     simvastatin 20 MG tablet  Commonly known as:  ZOCOR  Take 1 tablet (20 mg total) by mouth daily. bedtime     SYNTHROID 112 MCG tablet  Generic drug:  levothyroxine     vitamin B-12 1000 MCG tablet  Commonly known as:  CYANOCOBALAMIN  Take 1,000 mcg by mouth daily. am     Vitamin D3 2000 UNITS Tabs  Take 1 tablet by mouth daily. bedtime        Allergies: No Known Allergies  Family History: Family History  Problem Relation Age of Onset  . Heart disease Mother     CHF, CAD  . Hypertension Mother   . Mental illness Mother     Dementia  . Cancer Mother      breast, Brain Cancer  . Breast cancer Mother 46  . Cancer Father     lung  . Heart disease Father     MI  . Heart disease Sister   . Cancer Sister     Breast  . Barrett's esophagus Sister   . Hyperlipidemia Sister   . Breast cancer Sister 30  . Asthma Brother   . Heart disease Brother   . Diabetes Brother   . Anxiety disorder Brother   . Neuropathy Brother   . Neuropathy Sister   . Anxiety disorder Sister   . Mental illness Sister     Dementia  . Fibromyalgia Sister     Social History:  reports that she quit smoking about 37 years ago. Her smoking use included Cigarettes. She has a 10 pack-year smoking history. She has never used smokeless tobacco. She reports that she drinks about 0.6 - 1.8 oz of alcohol per week. She reports that she does not use illicit drugs.   Physical Exam: Ht 5\' 3"  (1.6 m)  Wt 209 lb 4.8 oz (94.938 kg)  BMI 37.09 kg/m2  Constitutional:  Alert and oriented, No acute distress. HEENT: [place] AT, moist mucus membranes.  Trachea midline, no masses. Cardiovascular: No clubbing, cyanosis, or edema. Respiratory: Normal respiratory effort, no increased work of breathing. GI: Abdomen is soft, nontender, nondistended, no abdominal masses GU: No CVA tenderness. Normal external genitalia, normal urethral meatus. Skin: No rashes, bruises or suspicious lesions. Neurologic: Grossly intact, no focal deficits, moving all 4 extremities. Psychiatric: Normal mood and affect.  Laboratory Data: Lab Results  Component Value Date   WBC 4.7 12/12/2014   HGB 12.5 11/14/2014   HCT 38.7 12/12/2014   MCV 87.2 11/14/2014   PLT 219 11/14/2014    Lab Results  Component Value Date   CREATININE 0.68 12/12/2014   Urinalysis UA today with trace blood on dip, otherwise negative. Microscopic exam shows no evidence of white blood cells were applied cells or bacteria.  Pertinent Imaging: CT Urogram as above   Cystoscopy Procedure Note  Patient identification was  confirmed, informed consent was obtained, and patient was prepped using Betadine solution.  Lidocaine jelly was administered per urethral meatus.    Preoperative abx where received prior to procedure.    Procedure: - Flexible cystoscope introduced, without any difficulty.   - Thorough search of the bladder revealed:    normal urethral meatus    normal urothelium, hypopigmented urothelial scar just beyond left UO at the site of  previous resection    no stones    no ulcers     no tumors    no urethral polyps    no trabeculation  - Ureteral orifices were normal in position and appearance.  Post-Procedure: - Patient tolerated the procedure well  Assessment & Plan:    1. 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  Cystoscopy today shows no evidence of recurrence at this time. Recommend annual cystoscopy given the potential for development of malignancy in the future, albeit fairly low risk. - Urinalysis, Complete - ciprofloxacin (CIPRO) tablet 500 mg; Take 1 tablet (500 mg total) by mouth once. - lidocaine (XYLOCAINE) 2 % jelly 1 application; Place 1 application into the urethra once.   Return in about 1 year (around 05/29/2016) for cysotscopy.  Hollice Espy, MD  [facility] [address] [place], [place] [address] [phone]

## 2015-06-27 ENCOUNTER — Other Ambulatory Visit: Payer: Self-pay

## 2015-06-27 DIAGNOSIS — E038 Other specified hypothyroidism: Secondary | ICD-10-CM

## 2015-06-27 MED ORDER — SYNTHROID 112 MCG PO TABS: 112.0000 ug | ORAL_TABLET | Freq: Every day | ORAL | Status: DC

## 2015-07-30 ENCOUNTER — Ambulatory Visit (INDEPENDENT_AMBULATORY_CARE_PROVIDER_SITE_OTHER): Payer: Medicare Other | Admitting: Podiatry

## 2015-07-30 ENCOUNTER — Encounter: Payer: Self-pay | Admitting: Podiatry

## 2015-07-30 VITALS — BP 148/70 | HR 95 | Resp 18

## 2015-07-30 DIAGNOSIS — B351 Tinea unguium: Secondary | ICD-10-CM

## 2015-07-30 NOTE — Progress Notes (Signed)
   Subjective:    Patient ID: Barbara Hernandez, female    DOB: Jun 24, 1940, 75 y.o.   MRN: [id number]  HPI i have some fungus on my toenails and saw Dr Milinda Pointer in the magazine talking about fungus toenails and wanted to see what options I had    Review of Systems  All other systems reviewed and are negative.      Objective:   Physical Exam: Vital signs are stable she is alert and oriented 3 no apparent distress very pleasant 75 year old female. Pulses are strong and palpable bilateral. Neurologic sensorium is intact per Semmes-Weinstein monofilament. Deep tendon reflexes are intact bilateral and muscle strength is intact bilateral. Orthopedic evaluation demonstrates all joints distal to the ankle I will full range of motion without crepitation. Cutaneous evaluation demonstrates supple well-hydrated cutis no erythema edema cellulitis drainage or odor. Toenails fourth digit left foot and third digit right foot demonstrate a nail dystrophy or a onychomycosis.        Assessment & Plan:  Nail dystrophy fourth left.  Plan: A sample of the skin and nail were taken today and sent for pathologic evaluation I will follow up with her once we have the results.

## 2015-08-26 ENCOUNTER — Telehealth: Payer: Self-pay | Admitting: Family Medicine

## 2015-08-26 ENCOUNTER — Telehealth: Payer: Self-pay | Admitting: *Deleted

## 2015-08-26 NOTE — Telephone Encounter (Signed)
Pt stated that she was advised that she will no longer get her CPAP supplies covered by insurance until they receive the follow up visit notes from 01/09/14. Pt request that they be faxed to [place]: [employer] [phone]. Thanks TNP

## 2015-08-26 NOTE — Telephone Encounter (Signed)
LMTCB Emily Drozdowski, CMA  

## 2015-08-26 NOTE — Telephone Encounter (Signed)
Needs ov to document using machine. Etc. Thanks.

## 2015-08-26 NOTE — Telephone Encounter (Signed)
Pt called for toenail fungal results.  Informed pt fungal culture results were + and Dr. Milinda Pointer wanted her to schedule an appt.  Transferred pt to schedulers.

## 2015-08-29 ENCOUNTER — Encounter: Payer: Self-pay | Admitting: Family Medicine

## 2015-08-29 ENCOUNTER — Ambulatory Visit (INDEPENDENT_AMBULATORY_CARE_PROVIDER_SITE_OTHER): Payer: Medicare Other | Admitting: Family Medicine

## 2015-08-29 VITALS — BP 120/74 | HR 67 | Temp 98.0°F | Resp 16 | Ht 63.0 in | Wt 211.0 lb

## 2015-08-29 DIAGNOSIS — I10 Essential (primary) hypertension: Secondary | ICD-10-CM | POA: Diagnosis not present

## 2015-08-29 DIAGNOSIS — M1990 Unspecified osteoarthritis, unspecified site: Secondary | ICD-10-CM

## 2015-08-29 DIAGNOSIS — G4730 Sleep apnea, unspecified: Secondary | ICD-10-CM

## 2015-08-29 DIAGNOSIS — E039 Hypothyroidism, unspecified: Secondary | ICD-10-CM | POA: Diagnosis not present

## 2015-08-29 DIAGNOSIS — E559 Vitamin D deficiency, unspecified: Secondary | ICD-10-CM | POA: Diagnosis not present

## 2015-08-29 DIAGNOSIS — D509 Iron deficiency anemia, unspecified: Secondary | ICD-10-CM

## 2015-08-29 DIAGNOSIS — E7800 Pure hypercholesterolemia, unspecified: Secondary | ICD-10-CM

## 2015-08-29 NOTE — Progress Notes (Signed)
Patient ID: Barbara Hernandez, female   DOB: 1940/12/07, 75 y.o.   MRN: [id number]       Patient: Barbara Hernandez Female    DOB: 01-30-1941   75 y.o.   MRN: [id number] Visit Date: 08/29/2015  Today's Provider: Margarita Rana, MD   Chief Complaint  Patient presents with  . Apnea   Subjective:    HPI Follow-up sleep apnea: Patient last ov 01/09/2014. No changes were made. Patient advised to continue CPAP at 9 cm water pressure with heated humidifier. Patient had sleep study done 04/07/2010. Patient reports excellent compliance and tolerance of treatment. Patient reports she used CPAP every night 6-6.5 hours.   Does get fatigued in the day.  Does not think it is related to her CPAP.  Would like her labs checked.     Hypothyroidism follow-up: Patient presents for evaluation of thyroid function. Symptoms consist of fatigue. Symptoms have present for several months. The symptoms are moderate.  The problem has been unchanged.  Previous thyroid studies include TSH. Lab Results  Component Value Date   TSH 1.350 02/04/2015   T4TOTAL 7.7 02/04/2015    Ear Pain: Patient presents with left ear pain.  Symptoms include left ear pain. Symptoms began 5 weeks ago and are unchanged since that time. Patient denies fever. Ear history: 0 previous ear infections.  No pattern to it.  Comes and goes.  Last less than a minute. No tinnitus. No hearing loss.        No Known Allergies Previous Medications   ASPIRIN 81 MG TABLET    Take 81 mg by mouth daily. bedtime   CELECOXIB (CELEBREX) 200 MG CAPSULE    Take 1 capsule (200 mg total) by mouth daily. am   CHOLECALCIFEROL (VITAMIN D3) 2000 UNITS TABS    Take 1 tablet by mouth daily. bedtime   DOCUSATE SODIUM 100 MG CAPSULE    Take 100 mg by mouth daily. bedtime   GLUCOSAMINE-CHONDROITIN 500-400 MG TABLET    Take 1 tablet by mouth 2 (two) times daily.   HYDROCHLOROTHIAZIDE (HYDRODIURIL) 12.5 MG TABLET    Take 1 tablet (12.5 mg total) by mouth daily. am   LISINOPRIL (PRINIVIL,ZESTRIL) 40 MG TABLET    Take 1 tablet (40 mg total) by mouth daily. bedtime   OMEPRAZOLE (PRILOSEC) 20 MG CAPSULE    Take 1 capsule (20 mg total) by mouth daily.   SIMVASTATIN (ZOCOR) 20 MG TABLET    Take 1 tablet (20 mg total) by mouth daily. bedtime   SYNTHROID 112 MCG TABLET    Take 1 tablet (112 mcg total) by mouth daily before breakfast.   VITAMIN B-12 (CYANOCOBALAMIN) 1000 MCG TABLET    Take 1,000 mcg by mouth daily. am    Review of Systems  Constitutional: Positive for fatigue.  HENT: Positive for ear pain.   Respiratory: Negative.   Gastrointestinal: Negative.   Endocrine: Negative.     Social History  Substance Use Topics  . Smoking status: Former Smoker -- 0.50 packs/day for 20 years    Types: Cigarettes    Quit date: 06/20/1977  . Smokeless tobacco: Never Used  . Alcohol Use: 0.6 - 1.8 oz/week    0-1 Glasses of wine, 1-2 Standard drinks or equivalent per week   Objective:   BP 120/74 mmHg  Pulse 67  Temp(Src) 98 F (36.7 C) (Oral)  Resp 16  Ht 5\' 3"  (1.6 m)  Wt 211 lb (95.709 kg)  BMI 37.39 kg/m2  SpO2 97%  Physical  Exam  Constitutional: She is oriented to person, place, and time. She appears well-developed and well-nourished.  HENT:  Head: Normocephalic and atraumatic.  Right Ear: External ear normal.  Left Ear: External ear normal.  Nose: Nose normal.  Mouth/Throat: Oropharynx is clear and moist.  Neck: Normal range of motion. Neck supple.  Cardiovascular: Normal rate, regular rhythm and normal heart sounds.   Pulmonary/Chest: Effort normal and breath sounds normal.  Neurological: She is alert and oriented to person, place, and time.  Psychiatric: She has a normal mood and affect. Her behavior is normal. Judgment and thought content normal.      Assessment & Plan:     1. Essential hypertension Condition is stable. Please continue current medication and  plan of care as noted.  Ear pain of unclear etiology. Normal exam today.  Follow up if worsens.   - CBC with Differential/Platelet - Comprehensive Metabolic Panel (CMET)  2. Hypothyroidism, unspecified hypothyroidism type Condition is stable. Please continue current medication and  plan of care as noted.   - TSH  3. Osteoarthritis, unspecified osteoarthritis type, unspecified site Stable.   4. Vitamin D deficiency Will check labs.  - VITAMIN D 25 Hydroxy (Vit-D Deficiency, Fractures)  5. Anemia, iron deficiency Having some fatigue. Will check labs.    6. Hypercholesteremia Stable.  Will check labs.   - Lipid panel  7. Sleep apnea Here for follow up face to face regarding sleep apnea. Using CPAP machine nighty at 9 cm. Really like her new machine with warmed air. Great compliance. Rested in am.  Symptoms improved on the machine. Will continue CPAP.       Patient was seen and examined by Jerrell Belfast, MD, and note scribed by Lynford Humphrey, [place].   I have reviewed the document for accuracy and completeness and I agree with above. - Jerrell Belfast, MD   Margarita Rana, MD  [facility]

## 2015-09-10 LAB — CBC WITH DIFFERENTIAL/PLATELET
BASOS: 0 %
Basophils Absolute: 0 10*3/uL (ref 0.0–0.2)
EOS (ABSOLUTE): 0.2 10*3/uL (ref 0.0–0.4)
EOS: 3 %
HEMATOCRIT: 38.7 % (ref 34.0–46.6)
Hemoglobin: 12.6 g/dL (ref 11.1–15.9)
IMMATURE GRANULOCYTES: 0 %
Immature Grans (Abs): 0 10*3/uL (ref 0.0–0.1)
LYMPHS ABS: 0.9 10*3/uL (ref 0.7–3.1)
Lymphs: 19 %
MCH: 28.7 pg (ref 26.6–33.0)
MCHC: 32.6 g/dL (ref 31.5–35.7)
MCV: 88 fL (ref 79–97)
MONOS ABS: 0.4 10*3/uL (ref 0.1–0.9)
Monocytes: 8 %
NEUTROS ABS: 3.1 10*3/uL (ref 1.4–7.0)
NEUTROS PCT: 70 %
Platelets: 251 10*3/uL (ref 150–379)
RBC: 4.39 x10E6/uL (ref 3.77–5.28)
RDW: 13.6 % (ref 12.3–15.4)
WBC: 4.6 10*3/uL (ref 3.4–10.8)

## 2015-09-10 LAB — COMPREHENSIVE METABOLIC PANEL
A/G RATIO: 2 (ref 1.2–2.2)
ALT: 12 IU/L (ref 0–32)
AST: 15 IU/L (ref 0–40)
Albumin: 4.3 g/dL (ref 3.5–4.8)
Alkaline Phosphatase: 91 IU/L (ref 39–117)
BILIRUBIN TOTAL: 1 mg/dL (ref 0.0–1.2)
BUN/Creatinine Ratio: 16 (ref 11–26)
BUN: 12 mg/dL (ref 8–27)
CALCIUM: 9.3 mg/dL (ref 8.7–10.3)
CHLORIDE: 95 mmol/L — AB (ref 96–106)
CO2: 25 mmol/L (ref 18–29)
Creatinine, Ser: 0.75 mg/dL (ref 0.57–1.00)
GFR, EST AFRICAN AMERICAN: 92 mL/min/{1.73_m2} (ref >59)
GFR, EST NON AFRICAN AMERICAN: 80 mL/min/{1.73_m2} (ref >59)
GLOBULIN, TOTAL: 2.1 g/dL (ref 1.5–4.5)
Glucose: 102 mg/dL — ABNORMAL HIGH (ref 65–99)
POTASSIUM: 5.4 mmol/L — AB (ref 3.5–5.2)
SODIUM: 135 mmol/L (ref 134–144)
Total Protein: 6.4 g/dL (ref 6.0–8.5)

## 2015-09-10 LAB — LIPID PANEL
Chol/HDL Ratio: 2 ratio units (ref 0.0–4.4)
Cholesterol, Total: 168 mg/dL (ref 100–199)
HDL: 85 mg/dL (ref >39)
LDL CALC: 64 mg/dL (ref 0–99)
Triglycerides: 93 mg/dL (ref 0–149)
VLDL CHOLESTEROL CAL: 19 mg/dL (ref 5–40)

## 2015-09-10 LAB — TSH: TSH: 5.72 u[IU]/mL — ABNORMAL HIGH (ref 0.450–4.500)

## 2015-09-10 LAB — VITAMIN D 25 HYDROXY (VIT D DEFICIENCY, FRACTURES): Vit D, 25-Hydroxy: 32.6 ng/mL (ref 30.0–100.0)

## 2015-09-11 ENCOUNTER — Telehealth: Payer: Self-pay

## 2015-09-11 MED ORDER — LEVOTHYROXINE SODIUM 125 MCG PO TABS: 125.0000 ug | ORAL_TABLET | Freq: Every day | ORAL | Status: DC

## 2015-09-11 NOTE — Telephone Encounter (Signed)
Advised pt as below. States she is taking thyroid med as prescribed. What new dose of Levothyroxine would you like? [facility] Frederich Balding, CMA

## 2015-09-11 NOTE — Telephone Encounter (Signed)
-----   Message from Margarita Rana, MD sent at 09/10/2015  6:31 AM EDT ----- Labs stable except for high potassium. May be labs error. Repeat with no charge per [facility] in 2 weeks. Also thyroid borderline low.  Please see if taking  Levothyroxine regularly. May need to increase.  Thanks.

## 2015-09-11 NOTE — Telephone Encounter (Signed)
Patient thinks that she already has bottles of Levothyroxine 172mcg at home. Patient will call us back to see if she wants [place] to send in refills. Thanks!

## 2015-09-11 NOTE — Telephone Encounter (Signed)
Would increase to 125 and recheck in 6 weeks. Thanks.

## 2015-09-11 NOTE — Telephone Encounter (Signed)
Pt called back and said to please call in the medication.  She does not have any at home.  She is out.  Thanks [employer]

## 2015-09-11 NOTE — Telephone Encounter (Signed)
Medication sent into the pharmacy. 

## 2015-09-12 ENCOUNTER — Other Ambulatory Visit: Payer: Self-pay

## 2015-09-12 DIAGNOSIS — E039 Hypothyroidism, unspecified: Secondary | ICD-10-CM

## 2015-09-12 MED ORDER — SYNTHROID 125 MCG PO TABS: 125.0000 ug | ORAL_TABLET | Freq: Every day | ORAL | Status: DC

## 2015-09-19 ENCOUNTER — Telehealth: Payer: Self-pay | Admitting: Family Medicine

## 2015-09-19 NOTE — Telephone Encounter (Signed)
Great. Please let her know that if they have any trouble viewing the labs we can fax them. Thanks.

## 2015-09-19 NOTE — Telephone Encounter (Signed)
Patient had called office stating that she had recent blood work done by Dr. Venia Minks and was scheduled to follow up, but she states that she will be following up with her new PCP doctor within the next week and will address labs there. She states that facility is through [facility] and provider had informed her that they can view and see her records through [place] system. KW

## 2015-09-24 ENCOUNTER — Encounter: Payer: Self-pay | Admitting: Podiatry

## 2015-09-24 ENCOUNTER — Ambulatory Visit (INDEPENDENT_AMBULATORY_CARE_PROVIDER_SITE_OTHER): Payer: Medicare Other | Admitting: Podiatry

## 2015-09-24 DIAGNOSIS — B351 Tinea unguium: Secondary | ICD-10-CM | POA: Diagnosis not present

## 2015-09-24 DIAGNOSIS — Z79899 Other long term (current) drug therapy: Secondary | ICD-10-CM | POA: Diagnosis not present

## 2015-09-24 NOTE — Progress Notes (Signed)
She presents today for follow-up of her lab reports. He states that I think the fungus is spreading to the other toenails.  Objective: Vital signs are stable she is alert and oriented 3. Pathology report does demonstrate positive fungus.  Assessment: Onychomycosis.  Plan: We discussed the pros and cons of oral therapy as well as the efficacy of topical therapy and laser therapy. At this point she was to do nothing. She will keep her nails cut and filed thin and will follow up with [place] on an as-needed basis for laser consideration.

## 2016-02-05 ENCOUNTER — Ambulatory Visit: Payer: Medicare Other | Admitting: General Surgery

## 2016-02-18 ENCOUNTER — Ambulatory Visit: Payer: Medicare Other | Admitting: General Surgery

## 2016-02-24 ENCOUNTER — Encounter: Payer: Self-pay | Admitting: General Surgery

## 2016-02-24 ENCOUNTER — Ambulatory Visit (INDEPENDENT_AMBULATORY_CARE_PROVIDER_SITE_OTHER): Payer: Medicare Other | Admitting: General Surgery

## 2016-02-24 VITALS — BP 166/88 | HR 66 | Resp 16 | Ht 64.0 in | Wt 208.0 lb

## 2016-02-24 DIAGNOSIS — K219 Gastro-esophageal reflux disease without esophagitis: Secondary | ICD-10-CM

## 2016-02-24 NOTE — Patient Instructions (Addendum)
The patient is aware to call back for any questions or concerns. Esophagogastroduodenoscopy Esophagogastroduodenoscopy (EGD) is a procedure that is used to examine the lining of the esophagus, stomach, and first part of the small intestine (duodenum). A long, flexible, lighted tube with a camera attached (endoscope) is inserted down the throat to view these organs. This procedure is done to detect problems or abnormalities, such as inflammation, bleeding, ulcers, or growths, in order to treat them. The procedure lasts 5-20 minutes. It is usually an outpatient procedure, but it may need to be performed in a hospital in emergency cases. LET [facility] CARE PROVIDER KNOW ABOUT:  Any allergies you have.  All medicines you are taking, including vitamins, herbs, eye drops, creams, and over-the-counter medicines.  Previous problems you or members of your family have had with the use of anesthetics.  Any blood disorders you have.  Previous surgeries you have had.  Medical conditions you have. RISKS AND COMPLICATIONS Generally, this is a safe procedure. However, problems can occur and include:  Infection.  Bleeding.  Tearing (perforation) of the esophagus, stomach, or duodenum.  Difficulty breathing or not being able to breathe.  Excessive sweating.  Spasms of the larynx.  Slowed heartbeat.  Low blood pressure. BEFORE THE PROCEDURE  Do not eat or drink anything after midnight on the night before the procedure or as directed by your health care provider.  Do not take your regular medicines before the procedure if your health care provider asks you not to. Ask your health care provider about changing or stopping those medicines.  If you wear dentures, be prepared to remove them before the procedure.  Arrange for someone to drive you home after the procedure. PROCEDURE  A numbing medicine (local anesthetic) may be sprayed in your throat for comfort and to stop you from gagging or  coughing.  You will have an IV tube inserted in a vein in your hand or arm. You will receive medicines and fluids through this tube.  You will be given a medicine to relax you (sedative).  A pain reliever will be given through the IV tube.  A mouth guard may be placed in your mouth to protect your teeth and to keep you from biting on the endoscope.  You will be asked to lie on your left side.  The endoscope will be inserted down your throat and into your esophagus, stomach, and duodenum.  Air will be put through the endoscope to allow your health care provider to clearly view the lining of your esophagus.  The lining of your esophagus, stomach, and duodenum will be examined. During the exam, your health care provider may:  Remove tissue to be examined under a microscope (biopsy) for inflammation, infection, or other medical problems.  Remove growths.  Remove objects (foreign bodies) that are stuck.  Treat any bleeding with medicines or other devices that stop tissues from bleeding (hot cautery, clipping devices).  Widen (dilate) or stretch narrowed areas of your esophagus and stomach.  The endoscope will be withdrawn. AFTER THE PROCEDURE  You will be taken to a recovery area for observation. Your blood pressure, heart rate, breathing rate, and blood oxygen level will be monitored often until the medicines you were given have worn off.  Do not eat or drink anything until the numbing medicine has worn off and your gag reflex has returned. You may choke.  Your health care provider should be able to discuss his or her findings with you. It will take  longer to discuss the test results if any biopsies were taken.   This information is not intended to replace advice given to you by your health care provider. Make sure you discuss any questions you have with your health care provider.   Document Released: 10/08/2004 Document Revised: 06/28/2014 Document Reviewed: 05/10/2012 Elsevier  Interactive Patient Education [employer].  The patient is scheduled for an Upper Endoscopy at [facility] on 03/15/16. She will only take her Lisinopril at 6 am with a small sip of water the morning of. She will take her C-Pap machine with her the day of.

## 2016-02-24 NOTE — Progress Notes (Signed)
Patient ID: Barbara Hernandez, female   DOB: [birth year], 75 y.o.   MRN: [id number]  Chief Complaint  Patient presents with  . Pre-op Exam    endoscopy    HPI Barbara Hernandez is a 75 y.o. female.  Here today for 3 year upper endoscopy discussion. The last endoscopy was completed in 2014. Denies any gastrointestinal issues. Bowels move regular and no bleeding noted. She does admit to "clearing her throat" (phlegm) more often, denies any coughing with meals. She did try to change from omeprazole to zantac but was unable to switch with progressive reflux symptoms. She is planning knee surgery in February 2018.  HPI  Past Medical History:  Diagnosis Date  . Anemia   . Arthritis   . Barrett's esophagus 2012   2010 upper endoscopy showed only reflux. 2012 biopsies suggested Barrett's epithelial changes.  . Bladder tumor   . BPPV (benign paroxysmal positional vertigo) 2004  . Family history of adverse reaction to anesthesia    sister had memory problems after delivery of child  . GERD (gastroesophageal reflux disease)   . H/O measles   . Hematuria   . Hematuria   . Hemorrhoid   . Hemorrhoids   . History of chicken pox   . Hyperlipidemia   . Hypertension   . Hypothyroidism   . Joint pain   . Mild left ventricular hypertrophy   . Onychomycosis   . Reflux   . Reflux esophagitis   . Sleep apnea 2011   Uses C-Pap machine  . Thyroid disease     Past Surgical History:  Procedure Laterality Date  . BLEPHAROPLASTY Bilateral 2015  . BREAST BIOPSY Left 1988  . COLONOSCOPY W/ BIOPSIES N/A 01/16/2013   No source for GI blood loss, mild lymphatic prominence in the rectum.  Marland Kitchen EYE SURGERY Bilateral 2006   cataract  . Left wrist fracture    . TRANSURETHRAL RESECTION OF BLADDER TUMOR N/A 11/20/2014   Procedure: TRANSURETHRAL RESECTION OF BLADDER TUMOR (TURBT);  Surgeon: Hollice Espy, MD;  Location: [facility] ORS;  Service: Urology;  Laterality: N/A;  . UPPER GI ENDOSCOPY  2012, 2014     Family History  Problem Relation Age of Onset  . Heart disease Mother     CHF, CAD  . Hypertension Mother   . Mental illness Mother     Dementia  . Cancer Mother     breast, Brain Cancer  . Breast cancer Mother 52  . Cancer Father     lung  . Heart disease Father     MI  . Heart disease Sister   . Cancer Sister     Breast  . Barrett's esophagus Sister   . Hyperlipidemia Sister   . Breast cancer Sister 34  . Asthma Brother   . Heart disease Brother   . Diabetes Brother   . Anxiety disorder Brother   . Neuropathy Brother   . Neuropathy Sister   . Anxiety disorder Sister   . Mental illness Sister     Dementia  . Fibromyalgia Sister     Social History Social History  Substance Use Topics  . Smoking status: Former Smoker    Packs/day: 0.50    Years: 20.00    Types: Cigarettes    Quit date: 06/20/1977  . Smokeless tobacco: Never Used  . Alcohol use 0.6 - 1.8 oz/week    1 - 2 Standard drinks or equivalent per week    No Known Allergies  Current Outpatient Prescriptions  Medication Sig Dispense Refill  . aspirin 81 MG tablet Take 81 mg by mouth daily. bedtime    . Calcium Carbonate-Vit D-Min (CALCIUM 1200 PO) Take by mouth daily.    . celecoxib (CELEBREX) 200 MG capsule Take 1 capsule (200 mg total) by mouth daily. am 90 capsule 3  . Cholecalciferol (VITAMIN D3) 2000 UNITS TABS Take 1 tablet by mouth daily. bedtime    . Docusate Sodium 100 MG capsule Take 100 mg by mouth daily. bedtime    . glucosamine-chondroitin 500-400 MG tablet Take 1 tablet by mouth 2 (two) times daily.    . hydrochlorothiazide (HYDRODIURIL) 12.5 MG tablet Take 1 tablet (12.5 mg total) by mouth daily. am 90 tablet 3  . levothyroxine (SYNTHROID, LEVOTHROID) 125 MCG tablet Take 1 tablet (125 mcg total) by mouth daily. 30 tablet 3  . lisinopril (PRINIVIL,ZESTRIL) 40 MG tablet Take 1 tablet (40 mg total) by mouth daily. bedtime 90 tablet 3  . omeprazole (PRILOSEC) 20 MG capsule Take 1 capsule  (20 mg total) by mouth daily. 90 capsule 3  . simvastatin (ZOCOR) 20 MG tablet Take 1 tablet (20 mg total) by mouth daily. bedtime 90 tablet 3  . vitamin B-12 (CYANOCOBALAMIN) 1000 MCG tablet Take 1,000 mcg by mouth daily. am     No current facility-administered medications for this visit.     Review of Systems Review of Systems  Constitutional: Negative.   Respiratory: Negative.   Cardiovascular: Negative.     Blood pressure (!) 166/88, pulse 66, resp. rate 16, height 5\' 4"  (1.626 m), weight 208 lb (94.3 kg).  Physical Exam Physical Exam  Constitutional: She is oriented to person, place, and time. She appears well-developed and well-nourished.  HENT:  Mouth/Throat: Oropharynx is clear and moist.  Eyes: Conjunctivae are normal. No scleral icterus.  Neck: Neck supple.  Cardiovascular: Normal rate, regular rhythm and normal heart sounds.   Pulmonary/Chest: Effort normal and breath sounds normal.  Abdominal: Soft.  Lymphadenopathy:    She has no cervical adenopathy.  Neurological: She is alert and oriented to person, place, and time.  Skin: Skin is warm and dry.  Psychiatric: Her behavior is normal.    Data Reviewed 07/13/2010 distal esophageal biopsy showed Barrett's esophagus with squamocolumnar mucosa with reflux gastritis. No dysplasia. 01/17/2013 upper endoscopy showed squamocolumnar mucosa with intestinal metaplasia without dysplasia at the distal esophagus, squamous mucosa with reflux changes in the more proximal esophagus. Assessment    Long history of gastroesophageal reflux with previous biopsies in 2012 showing evidence of Barrett's epithelial change, resolved on follow-up biopsies.    Plan    Indications for repeat endoscopy reviewed.    Upper endoscopy with possible biopsy prn: Information regarding the procedure, including its potential risks and complications (including but not limited to perforation of the stomach, which may require emergency surgery to  repair, and bleeding) was verbally given to the patient. Educational information regarding upper endoscopy was given to the patient. Written instructions for how to complete the prep were provided. The importance of drinking ample fluids to avoid dehydration as a result of the prep emphasized.  The patient is scheduled for an Upper Endoscopy at [facility] on 03/15/16. She will only take her Lisinopril at 6 am with a small sip of water the morning of. She will take her C-Pap machine with her the day of.   This information has been scribed by Karie Fetch RN, BSN,BC.   Barbara Hernandez 02/25/2016, 6:25 AM

## 2016-03-15 ENCOUNTER — Ambulatory Visit
Admission: RE | Admit: 2016-03-15 | Discharge: 2016-03-15 | Disposition: A | Discharge status: Home / Self Care | Payer: Medicare Other | Source: Ambulatory Visit | Attending: General Surgery | Admitting: General Surgery

## 2016-03-15 ENCOUNTER — Encounter
Admission: RE | Disposition: A | Discharge status: Home / Self Care | Payer: Self-pay | Source: Ambulatory Visit | Attending: General Surgery

## 2016-03-15 ENCOUNTER — Ambulatory Visit: Payer: Medicare Other | Admitting: Anesthesiology

## 2016-03-15 DIAGNOSIS — Z09 Encounter for follow-up examination after completed treatment for conditions other than malignant neoplasm: Secondary | ICD-10-CM | POA: Insufficient documentation

## 2016-03-15 DIAGNOSIS — Z7982 Long term (current) use of aspirin: Secondary | ICD-10-CM | POA: Insufficient documentation

## 2016-03-15 DIAGNOSIS — K317 Polyp of stomach and duodenum: Secondary | ICD-10-CM | POA: Insufficient documentation

## 2016-03-15 DIAGNOSIS — G4730 Sleep apnea, unspecified: Secondary | ICD-10-CM | POA: Insufficient documentation

## 2016-03-15 DIAGNOSIS — Z79899 Other long term (current) drug therapy: Secondary | ICD-10-CM | POA: Insufficient documentation

## 2016-03-15 DIAGNOSIS — E669 Obesity, unspecified: Secondary | ICD-10-CM | POA: Insufficient documentation

## 2016-03-15 DIAGNOSIS — I10 Essential (primary) hypertension: Secondary | ICD-10-CM | POA: Diagnosis not present

## 2016-03-15 DIAGNOSIS — K2270 Barrett's esophagus without dysplasia: Secondary | ICD-10-CM | POA: Diagnosis not present

## 2016-03-15 DIAGNOSIS — Z87891 Personal history of nicotine dependence: Secondary | ICD-10-CM | POA: Diagnosis not present

## 2016-03-15 DIAGNOSIS — K219 Gastro-esophageal reflux disease without esophagitis: Secondary | ICD-10-CM

## 2016-03-15 DIAGNOSIS — K210 Gastro-esophageal reflux disease with esophagitis: Secondary | ICD-10-CM | POA: Diagnosis not present

## 2016-03-15 DIAGNOSIS — E039 Hypothyroidism, unspecified: Secondary | ICD-10-CM | POA: Insufficient documentation

## 2016-03-15 DIAGNOSIS — E785 Hyperlipidemia, unspecified: Secondary | ICD-10-CM | POA: Insufficient documentation

## 2016-03-15 DIAGNOSIS — Z6835 Body mass index (BMI) 35.0-35.9, adult: Secondary | ICD-10-CM | POA: Diagnosis not present

## 2016-03-15 HISTORY — PX: ESOPHAGOGASTRODUODENOSCOPY (EGD) WITH PROPOFOL: SHX5813

## 2016-03-15 SURGERY — ESOPHAGOGASTRODUODENOSCOPY (EGD) WITH PROPOFOL
Anesthesia: General

## 2016-03-15 MED ORDER — PROPOFOL 10 MG/ML IV BOLUS
INTRAVENOUS | Status: DC | PRN
  Administered 2016-03-15: 25 mg via INTRAVENOUS
  Administered 2016-03-15: 20 mg via INTRAVENOUS

## 2016-03-15 MED ORDER — PROPOFOL 500 MG/50ML IV EMUL
INTRAVENOUS | Status: DC | PRN
  Administered 2016-03-15: 140 ug/kg/min via INTRAVENOUS

## 2016-03-15 MED ORDER — MIDAZOLAM HCL 5 MG/5ML IJ SOLN
INTRAMUSCULAR | Status: DC | PRN
  Administered 2016-03-15: 1 mg via INTRAVENOUS

## 2016-03-15 MED ORDER — SODIUM CHLORIDE 0.9 % IV SOLN
INTRAVENOUS | Status: DC
  Administered 2016-03-15: 1000 mL via INTRAVENOUS

## 2016-03-15 MED ORDER — FENTANYL CITRATE (PF) 100 MCG/2ML IJ SOLN
INTRAMUSCULAR | Status: DC | PRN
  Administered 2016-03-15: 50 ug via INTRAVENOUS

## 2016-03-15 NOTE — H&P (Signed)
No change in clinical history or exam.  For EGD to f/u previously identified Barrett's epithelial changes.

## 2016-03-15 NOTE — Anesthesia Preprocedure Evaluation (Signed)
Anesthesia Evaluation  Patient identified by MRN, date of birth, ID band Patient awake    Reviewed: Allergy & Precautions, NPO status , Patient's Chart, lab work & pertinent test results  History of Anesthesia Complications Negative for: history of anesthetic complications  Airway Mallampati: II  TM Distance: >3 FB Neck ROM: Full    Dental  (+) Lower Dentures, Upper Dentures   Pulmonary sleep apnea and Continuous Positive Airway Pressure Ventilation , neg COPD, former smoker,    breath sounds clear to auscultation- rhonchi (-) wheezing      Cardiovascular hypertension, Pt. on medications (-) CAD and (-) Past MI  Rhythm:Regular Rate:Normal - Systolic murmurs and - Diastolic murmurs    Neuro/Psych negative neurological ROS  negative psych ROS   GI/Hepatic Neg liver ROS, GERD  ,  Endo/Other  neg diabetesHypothyroidism   Renal/GU negative Renal ROS     Musculoskeletal  (+) Arthritis , Osteoarthritis,    Abdominal (+) + obese,   Peds  Hematology  (+) anemia ,   Anesthesia Other Findings Past Medical History: No date: Anemia No date: Arthritis 2012: Barrett's esophagus     Comment: 2010 upper endoscopy showed only reflux. 2012               biopsies suggested Barrett's epithelial               changes. No date: Bladder tumor 2004: BPPV (benign paroxysmal positional vertigo) No date: Family history of adverse reaction to anesthes*     Comment: sister had memory problems after delivery of               child No date: GERD (gastroesophageal reflux disease) No date: H/O measles No date: Hematuria No date: Hematuria No date: Hemorrhoid No date: Hemorrhoids No date: History of chicken pox No date: Hyperlipidemia No date: Hypertension No date: Hypothyroidism No date: Joint pain No date: Mild left ventricular hypertrophy No date: Onychomycosis No date: Reflux No date: Reflux esophagitis 2011: Sleep apnea  Comment: Uses C-Pap machine No date: Thyroid disease   Reproductive/Obstetrics                             Anesthesia Physical Anesthesia Plan  ASA: III  Anesthesia Plan: General   Post-op Pain Management:    Induction: Intravenous  Airway Management Planned: Natural Airway  Additional Equipment:   Intra-op Plan:   Post-operative Plan:   Informed Consent: I have reviewed the patients History and Physical, chart, labs and discussed the procedure including the risks, benefits and alternatives for the proposed anesthesia with the patient or authorized representative who has indicated his/her understanding and acceptance.   Dental advisory given  Plan Discussed with: Anesthesiologist and CRNA  Anesthesia Plan Comments:         Anesthesia Quick Evaluation

## 2016-03-15 NOTE — Transfer of Care (Signed)
Immediate Anesthesia Transfer of Care Note  Patient: Charlann Buitrago  Procedure(s) Performed: Procedure(s): ESOPHAGOGASTRODUODENOSCOPY (EGD) WITH PROPOFOL (N/A)  Patient Location: PACU and Endoscopy Unit  Anesthesia Type:General  Level of Consciousness: awake, alert  and oriented  Airway & Oxygen Therapy: Patient Spontanous Breathing and Patient connected to nasal cannula oxygen  Post-op Assessment: Report given to RN and Post -op Vital signs reviewed and stable  Post vital signs: Reviewed and stable  Last Vitals:  Vitals:   03/15/16 0808 03/15/16 0857  BP: (!) 154/77   Pulse: 66   Resp: 18   Temp: (!) 35.6 C (!) 36 C    Last Pain:  Vitals:   03/15/16 0857  TempSrc: Tympanic         Complications: No apparent anesthesia complications

## 2016-03-15 NOTE — Op Note (Signed)
[facility] Gastroenterology Patient Name: Barbara Hernandez Procedure Date: 03/15/2016 8:36 AM MRN: [id number] Account #: [id number] Date of Birth: 1941/05/19 Admit Type: Outpatient Age: 75 Room: [facility] ENDO ROOM 1 Gender: Female Note Status: Finalized Procedure:            Upper GI endoscopy Indications:          Follow-up of Barrett's esophagus Providers:            Robert Bellow, MD Referring MD:         Ardyth Man. Bobette Mo (Referring MD) Medicines:            Monitored Anesthesia Care Complications:        No immediate complications. Procedure:            Pre-Anesthesia Assessment:                       - Prior to the procedure, a History and Physical was                        performed, and patient medications, allergies and                        sensitivities were reviewed. The patient's tolerance of                        previous anesthesia was reviewed.                       - The risks and benefits of the procedure and the                        sedation options and risks were discussed with the                        patient. All questions were answered and informed                        consent was obtained.                       After obtaining informed consent, the endoscope was                        passed under direct vision. Throughout the procedure,                        the patient's blood pressure, pulse, and oxygen                        saturations were monitored continuously. The Endoscope                        was introduced through the mouth, and advanced to the                        second part of duodenum. The upper GI endoscopy was                        accomplished without difficulty. The patient tolerated  the procedure well. Findings:      There were esophageal mucosal changes secondary to established       short-segment Barrett's disease present in the lower third of the       esophagus. The maximum  longitudinal extent of these mucosal changes was       2 cm in length. Mucosa was biopsied with a cold forceps for histology in       3 sections at intervals of 2 cm at 35 and 37 cm from the incisors. A       total of 2 specimen bottles were sent to pathology.      Multiple 3 mm sessile polyps with no bleeding and no stigmata of recent       bleeding were found on the greater curvature of the stomach. Biopsies       were taken with a cold forceps for histology.      The examined duodenum was normal. Impression:           - Esophageal mucosal changes secondary to established                        short-segment Barrett's disease. Biopsied.                       - Multiple gastric polyps. Biopsied.                       - Normal examined duodenum. Recommendation:       - Telephone endoscopist for pathology results in 1 week. Procedure Code(s):    --- Professional ---                       [id number], Esophagogastroduodenoscopy, flexible, transoral;                        with biopsy, single or multiple Diagnosis Code(s):    --- Professional ---                       K22.70, Barrett's esophagus without dysplasia                       K31.7, Polyp of stomach and duodenum CPT copyright 2016 American Medical Association. All rights reserved. The codes documented in this report are preliminary and upon coder review may  be revised to meet current compliance requirements. Robert Bellow, MD 03/15/2016 8:54:22 AM This report has been signed electronically. Number of Addenda: 0 Note Initiated On: 03/15/2016 8:36 AM      [facility]

## 2016-03-15 NOTE — Anesthesia Procedure Notes (Signed)
Date/Time: 03/15/2016 8:40 AM Performed by: Kennon Holter Pre-anesthesia Checklist: Patient identified, Emergency Drugs available, Suction available, Patient being monitored and Timeout performed Patient Re-evaluated:Patient Re-evaluated prior to inductionOxygen Delivery Method: Nasal cannula Intubation Type: IV induction Placement Confirmation: positive ETCO2

## 2016-03-15 NOTE — Anesthesia Postprocedure Evaluation (Signed)
Anesthesia Post Note  Patient: Barbara Hernandez  Procedure(s) Performed: Procedure(s) (LRB): ESOPHAGOGASTRODUODENOSCOPY (EGD) WITH PROPOFOL (N/A)  Patient location during evaluation: Endoscopy Anesthesia Type: General Level of consciousness: awake and alert and oriented Pain management: pain level controlled Vital Signs Assessment: post-procedure vital signs reviewed and stable Respiratory status: spontaneous breathing, nonlabored ventilation and respiratory function stable Cardiovascular status: blood pressure returned to baseline and stable Postop Assessment: no signs of nausea or vomiting Anesthetic complications: no    Last Vitals:  Vitals:   03/15/16 0917 03/15/16 0930  BP: (!) 162/88 (!) 168/84  Pulse: 66 64  Resp: 18 16  Temp:      Last Pain:  Vitals:   03/15/16 0857  TempSrc: Tympanic                 [name]

## 2016-03-16 LAB — SURGICAL PATHOLOGY

## 2016-03-17 ENCOUNTER — Telehealth: Payer: Self-pay | Admitting: *Deleted

## 2016-03-17 NOTE — Telephone Encounter (Signed)
Patient notified as instructed, verbalized understanding. Placed in recalls.

## 2016-03-17 NOTE — Telephone Encounter (Signed)
-----   Message from Robert Bellow, MD sent at 03/17/2016  2:53 PM EDT ----- Please notify the patient all the biopsies were fine. Would recommend a repeat exam in three years, earlier if she is having any problems.  ----- Message ----- From: Interface, Lab In Three Zero One Sent: 03/16/2016  11:35 AM To: Robert Bellow, MD

## 2016-03-29 ENCOUNTER — Other Ambulatory Visit: Payer: Self-pay | Admitting: Family Medicine

## 2016-03-29 DIAGNOSIS — Z1231 Encounter for screening mammogram for malignant neoplasm of breast: Secondary | ICD-10-CM

## 2016-04-01 ENCOUNTER — Encounter: Payer: Self-pay | Admitting: General Surgery

## 2016-04-11 ENCOUNTER — Other Ambulatory Visit: Payer: Self-pay | Admitting: Family Medicine

## 2016-04-11 DIAGNOSIS — E7800 Pure hypercholesterolemia, unspecified: Secondary | ICD-10-CM

## 2016-04-11 DIAGNOSIS — K219 Gastro-esophageal reflux disease without esophagitis: Secondary | ICD-10-CM

## 2016-04-11 DIAGNOSIS — I10 Essential (primary) hypertension: Secondary | ICD-10-CM

## 2016-04-26 ENCOUNTER — Ambulatory Visit
Admission: RE | Admit: 2016-04-26 | Discharge: 2016-04-26 | Disposition: A | Discharge status: Home / Self Care | Payer: Medicare Other | Source: Ambulatory Visit | Attending: Family Medicine | Admitting: Family Medicine

## 2016-04-26 DIAGNOSIS — Z1231 Encounter for screening mammogram for malignant neoplasm of breast: Secondary | ICD-10-CM | POA: Insufficient documentation

## 2016-05-04 ENCOUNTER — Ambulatory Visit: Payer: Medicare Other | Admitting: Urology

## 2016-05-06 ENCOUNTER — Ambulatory Visit: Payer: Medicare Other | Admitting: Urology

## 2016-05-06 VITALS — BP 188/98 | HR 82 | Ht 64.0 in | Wt 208.0 lb

## 2016-05-06 DIAGNOSIS — Z8551 Personal history of malignant neoplasm of bladder: Secondary | ICD-10-CM | POA: Diagnosis not present

## 2016-05-06 DIAGNOSIS — R351 Nocturia: Secondary | ICD-10-CM

## 2016-05-06 LAB — URINALYSIS, COMPLETE
Bilirubin, UA: NEGATIVE
GLUCOSE, UA: NEGATIVE
Ketones, UA: NEGATIVE
Leukocytes, UA: NEGATIVE
NITRITE UA: NEGATIVE
PH UA: 6.5 (ref 5.0–7.5)
Protein, UA: NEGATIVE
RBC, UA: NEGATIVE
Specific Gravity, UA: 1.015 (ref 1.005–1.030)
UUROB: 0.2 mg/dL (ref 0.2–1.0)

## 2016-05-06 LAB — MICROSCOPIC EXAMINATION

## 2016-05-06 MED ORDER — CIPROFLOXACIN HCL 500 MG PO TABS
500.0000 mg | ORAL_TABLET | Freq: Once | ORAL | Status: AC
  Administered 2016-05-06: 500 mg via ORAL

## 2016-05-06 MED ORDER — LIDOCAINE HCL 2 % EX GEL
1.0000 "application " | Freq: Once | CUTANEOUS | Status: AC
  Administered 2016-05-06: 1 via URETHRAL

## 2016-05-06 NOTE — Progress Notes (Signed)
05/06/2016 9:15 AM   Barbara Hernandez Nov 12, 1940 [id number]  Referring provider: Margarita Rana, MD No address on file  Chief Complaint  Patient presents with  . Cysto    HPI: 74-female who initially presented with gross hematuria who underwent hematuria workup including CT urogram on 4 2016 as well as cystoscopy revealing a small 1 cm tumor just beyond the left UO. She underwent TURBT in 10/2013 with pathology consistent with PUNLMP.  She was a former smoker, smoked 15 years, 1 pack per day and quit in her mid 44s.  She does mention today that she is having the replacement surgery. She is concerned about mobilizing to the restroom in the postoperativ period.  She is wondering if there is any medi to help her avoid getting up at night. She denies any daytime symptoms.  10/15/14 CT Urogram IMPRESSION: No renal, ureteral, or bladder calculi. No hydronephrosis. No enhancing renal lesions. No filling defects in the bilateral opacified proximal collecting systems, ureters, or bladder.  She returns today to the office for routine surveillance cystoscopy.    PMH: Past Medical History:  Diagnosis Date  . Anemia   . Arthritis   . Barrett's esophagus 2012   2010 upper endoscopy showed only reflux. 2012 biopsies suggested Barrett's epithelial changes.  . Bladder tumor   . BPPV (benign paroxysmal positional vertigo) 2004  . Family history of adverse reaction to anesthesia    sister had memory problems after delivery of child  . GERD (gastroesophageal reflux disease)   . H/O measles   . Hematuria   . Hematuria   . Hemorrhoid   . Hemorrhoids   . History of chicken pox   . Hyperlipidemia   . Hypertension   . Hypothyroidism   . Joint pain   . Mild left ventricular hypertrophy   . Onychomycosis   . Reflux   . Reflux esophagitis   . Sleep apnea 2011   Uses C-Pap machine  . Thyroid disease     Surgical History: Past Surgical History:  Procedure Laterality Date  .  BLEPHAROPLASTY Bilateral 2015  . BREAST EXCISIONAL BIOPSY Left 1988   benign  . COLONOSCOPY W/ BIOPSIES N/A 01/16/2013   No source for GI blood loss, mild lymphatic prominence in the rectum.  . ESOPHAGOGASTRODUODENOSCOPY (EGD) WITH PROPOFOL N/A 03/15/2016   Procedure: ESOPHAGOGASTRODUODENOSCOPY (EGD) WITH PROPOFOL;  Surgeon: Robert Bellow, MD;  Location: [facility] ENDOSCOPY;  Service: Endoscopy;  Laterality: N/A;  . EYE SURGERY Bilateral 2006   cataract  . Left wrist fracture    . TRANSURETHRAL RESECTION OF BLADDER TUMOR N/A 11/20/2014   Procedure: TRANSURETHRAL RESECTION OF BLADDER TUMOR (TURBT);  Surgeon: Hollice Espy, MD;  Location: [facility] ORS;  Service: Urology;  Laterality: N/A;  . UPPER GI ENDOSCOPY  2012, 2014    Home Medications:    Medication List       Accurate as of 05/06/16  9:15 AM. Always use your most recent med list.          aspirin 81 MG tablet Take 81 mg by mouth daily. bedtime   CALCIUM 1200 PO Take by mouth daily.   celecoxib 200 MG capsule Commonly known as:  CELEBREX Take 1 capsule (200 mg total) by mouth daily. am   Docusate Sodium 100 MG capsule Take 100 mg by mouth daily. bedtime   glucosamine-chondroitin 500-400 MG tablet Take 1 tablet by mouth 2 (two) times daily.   hydrochlorothiazide 12.5 MG tablet Commonly known as:  HYDRODIURIL Take 1 tablet (  12.5 mg total) by mouth daily. am   levothyroxine 125 MCG tablet Commonly known as:  SYNTHROID, LEVOTHROID Take 1 tablet (125 mcg total) by mouth daily.   lisinopril 40 MG tablet Commonly known as:  PRINIVIL,ZESTRIL Take 1 tablet (40 mg total) by mouth daily. bedtime   omeprazole 20 MG capsule Commonly known as:  PRILOSEC Take 1 capsule (20 mg total) by mouth daily.   simvastatin 20 MG tablet Commonly known as:  ZOCOR Take 1 tablet (20 mg total) by mouth daily. bedtime   vitamin B-12 1000 MCG tablet Commonly known as:  CYANOCOBALAMIN Take 1,000 mcg by mouth daily. am   Vitamin D3 2000  units Tabs Take 1 tablet by mouth daily. bedtime       Allergies: No Known Allergies  Family History: Family History  Problem Relation Age of Onset  . Heart disease Mother     CHF, CAD  . Hypertension Mother   . Mental illness Mother     Dementia  . Cancer Mother     breast, Brain Cancer  . Breast cancer Mother 78  . Cancer Father     lung  . Heart disease Father     MI  . Heart disease Sister   . Cancer Sister     Breast  . Barrett's esophagus Sister   . Hyperlipidemia Sister   . Breast cancer Sister 36  . Asthma Brother   . Heart disease Brother   . Diabetes Brother   . Anxiety disorder Brother   . Neuropathy Brother   . Neuropathy Sister   . Anxiety disorder Sister   . Mental illness Sister     Dementia  . Fibromyalgia Sister     Social History:  reports that she quit smoking about 38 years ago. Her smoking use included Cigarettes. She has a 10.00 pack-year smoking history. She has never used smokeless tobacco. She reports that she drinks about 0.6 - 1.8 oz of alcohol per week . She reports that she does not use drugs.   Physical Exam: BP (!) 188/98   Pulse 82   Ht 5\' 4"  (1.626 m)   Wt 208 lb (94.3 kg)   BMI 35.70 kg/m   Constitutional:  Alert and oriented, No acute distress. HEENT: [place] AT, moist mucus membranes.  Trachea midline, no masses. Cardiovascular: No clubbing, cyanosis, or edema. Respiratory: Normal respiratory effort, no increased work of breathing. GI: Abdomen is soft, nontender, nondistended, no abdominal masses GU: No CVA tenderness. Normal external genitalia, normal urethral meatus. Skin: No rashes, bruises or suspicious lesions. Neurologic: Grossly intact, no focal deficits, moving all 4 extremities. Psychiatric: Normal mood and affect.  Laboratory Data: Lab Results  Component Value Date   WBC 4.6 09/09/2015   HGB 12.5 11/14/2014   HCT 38.7 09/09/2015   MCV 88 09/09/2015   PLT 251 09/09/2015    Lab Results  Component Value Date    CREATININE 0.75 09/09/2015   Urinalysis UA today with trace blood on dip, otherwise negative. Microscopic exam shows no evidence of white blood cells were applied cells or bacteria.  Pertinent Imaging: CT Urogram as above   Cystoscopy Procedure Note  Patient identification was confirmed, informed consent was obtained, and patient was prepped using Betadine solution.  Lidocaine jelly was administered per urethral meatus.    Preoperative abx where received prior to procedure.    Procedure: - Flexible cystoscope introduced, without any difficulty.   - Thorough search of the bladder revealed:    normal  urethral meatus    normal urothelium, hypopigmented urothelial scar just beyond left UO at the site of previous resection    no stones    no ulcers     no tumors    no urethral polyps    no trabeculation  - Ureteral orifices were normal in position and appearance.  Post-Procedure: - Patient tolerated the procedure well  Assessment & Plan:    1. 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   Cystoscopy today shows no evidence of recurrence at this time.  Recommend annual cystoscopy given the potential for development of malignancy in the future, albeit fairly low risk. - Urinalysis, Complete - ciprofloxacin (CIPRO) tablet 500 mg; Take 1 tablet (500 mg total) by mouth once. - lidocaine (XYLOCAINE) 2 % jelly 1 application; Place 1 application into the urethra once.  2. Nocturia Briefly discussed use of possible Ditropan at night around the time of her surgery We discussed the risk of dry eyes, dry mouth and constipation Given concern for constipation, elected not to pursue this.   Return for cysto.  Hollice Espy, MD  [facility] [address] [place], [place] [address] [phone]

## 2016-05-26 ENCOUNTER — Ambulatory Visit
Admission: RE | Admit: 2016-05-26 | Discharge: 2016-05-26 | Disposition: A | Discharge status: Home / Self Care | Payer: Medicare Other | Source: Ambulatory Visit | Attending: Orthopedic Surgery | Admitting: Orthopedic Surgery

## 2016-05-26 ENCOUNTER — Other Ambulatory Visit: Payer: Self-pay | Admitting: Orthopedic Surgery

## 2016-05-26 DIAGNOSIS — M069 Rheumatoid arthritis, unspecified: Secondary | ICD-10-CM | POA: Diagnosis not present

## 2016-05-26 DIAGNOSIS — M25561 Pain in right knee: Secondary | ICD-10-CM | POA: Insufficient documentation

## 2016-05-26 DIAGNOSIS — M25562 Pain in left knee: Secondary | ICD-10-CM | POA: Insufficient documentation

## 2016-05-26 DIAGNOSIS — M170 Bilateral primary osteoarthritis of knee: Secondary | ICD-10-CM | POA: Diagnosis not present

## 2016-05-26 DIAGNOSIS — R52 Pain, unspecified: Secondary | ICD-10-CM

## 2016-07-28 DIAGNOSIS — M1711 Unilateral primary osteoarthritis, right knee: Secondary | ICD-10-CM | POA: Diagnosis present

## 2016-07-28 NOTE — H&P (Signed)
TOTAL KNEE ADMISSION H&P  Patient is being admitted for right total knee arthroplasty.  Subjective:  Chief Complaint:right knee pain.  HPI: Barbara Hernandez, 76 y.o. female, has a history of pain and functional disability in the right knee due to arthritis and has failed non-surgical conservative treatments for greater than 12 weeks to includeNSAID's and/or analgesics, corticosteriod injections, viscosupplementation injections, flexibility and strengthening excercises, supervised PT with diminished ADL's post treatment, use of assistive devices and activity modification.  Onset of symptoms was gradual, starting 10 years ago with gradually worsening course since that time. The patient noted no past surgery on the right knee(s).  Patient currently rates pain in the right knee(s) at 10 out of 10 with activity. Patient has night pain, worsening of pain with activity and weight bearing, pain that interferes with activities of daily living, crepitus and joint swelling.  Patient has evidence of subchondral sclerosis, periarticular osteophytes and joint space narrowing by imaging studies. There is no active infection.  Patient Active Problem List   Diagnosis Date Noted  . Primary localized osteoarthrosis of the knee, right 07/28/2016  . Benign essential HTN 03/18/2015  . 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 Arthritis, degenerative 11/22/2014  . VFD (visual field defect) 11/22/2014  . Hypothyroidism 10/08/2014  . Barrett's esophagus 10/08/2014  . Hypercholesteremia 10/08/2014  . Hypertension 10/08/2014  . Hematuria 10/08/2014  . Onychomycosis 10/08/2014  . Mild left ventricular hypertrophy 10/08/2014  . Pernicious anemia 10/08/2014  . Vitamin D deficiency 10/08/2014  . Vertigo, benign paroxysmal 10/08/2014  . Systolic murmur [id number]  . Sleep apnea 10/08/2014  . Arthritis of shoulder region, right, degenerative 10/08/2014  . Allergic rhinitis 10/08/2014  . 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 MI  (mitral incompetence) 08/14/2014  . Esophageal reflux 09/07/2012  . Hemorrhoid    Past Medical History:  Diagnosis Date  . Anemia   . Arthritis   . Barrett's esophagus 2012   2010 upper endoscopy showed only reflux. 2012 biopsies suggested Barrett's epithelial changes.  . Bladder tumor   . BPPV (benign paroxysmal positional vertigo) 2004  . Family history of adverse reaction to anesthesia    sister had memory problems after delivery of child  . GERD (gastroesophageal reflux disease)   . H/O measles   . Hematuria   . Hematuria   . Hemorrhoid   . Hemorrhoids   . History of chicken pox   . Hyperlipidemia   . Hypertension   . Hypothyroidism   . Joint pain   . Mild left ventricular hypertrophy   . Onychomycosis   . Reflux   . Reflux esophagitis   . Sleep apnea 2011   Uses C-Pap machine  . Thyroid disease     Past Surgical History:  Procedure Laterality Date  . BLEPHAROPLASTY Bilateral 2015  . BREAST EXCISIONAL BIOPSY Left 1988   benign  . COLONOSCOPY W/ BIOPSIES N/A 01/16/2013   No source for GI blood loss, mild lymphatic prominence in the rectum.  . ESOPHAGOGASTRODUODENOSCOPY (EGD) WITH PROPOFOL N/A 03/15/2016   Procedure: ESOPHAGOGASTRODUODENOSCOPY (EGD) WITH PROPOFOL;  Surgeon: Robert Bellow, MD;  Location: [facility] ENDOSCOPY;  Service: Endoscopy;  Laterality: N/A;  . EYE SURGERY Bilateral 2006   cataract  . Left wrist fracture    . TRANSURETHRAL RESECTION OF BLADDER TUMOR N/A 11/20/2014   Procedure: TRANSURETHRAL RESECTION OF BLADDER TUMOR (TURBT);  Surgeon: Hollice Espy, MD;  Location: [facility] ORS;  Service: Urology;  Laterality: N/A;  . UPPER GI ENDOSCOPY  2012, 2014    No current  facility-administered medications for this encounter.   Current Outpatient Prescriptions:  .  acetaminophen (TYLENOL) 500 MG tablet, Take 500 mg by mouth every 6 (six) hours as needed for mild pain., Disp: , Rfl:  .  Artificial Tear Solution (GENTEAL TEARS OP), Apply 1 application to eye at  bedtime., Disp: , Rfl:  .  aspirin 81 MG tablet, Take 81 mg by mouth every evening. bedtime, Disp: , Rfl:  .  Calcium Carbonate-Vit D-Min (CALCIUM 1200 PO), Take 1 tablet by mouth daily. , Disp: , Rfl:  .  carboxymethylcellulose (REFRESH PLUS) 0.5 % SOLN, Place 1 drop into both eyes daily as needed., Disp: , Rfl:  .  celecoxib (CELEBREX) 200 MG capsule, Take 1 capsule (200 mg total) by mouth daily. am, Disp: 90 capsule, Rfl: 3 .  Cholecalciferol (VITAMIN D3) 2000 UNITS TABS, Take 1 tablet by mouth every evening. bedtime, Disp: , Rfl:  .  Docusate Sodium 100 MG capsule, Take 100 mg by mouth every evening. bedtime, Disp: , Rfl:  .  glucosamine-chondroitin 500-400 MG tablet, Take 1 tablet by mouth 2 (two) times daily., Disp: , Rfl:  .  hydrochlorothiazide (HYDRODIURIL) 12.5 MG tablet, Take 1 tablet (12.5 mg total) by mouth daily. am, Disp: 90 tablet, Rfl: 3 .  levothyroxine (SYNTHROID, LEVOTHROID) 125 MCG tablet, Take 1 tablet (125 mcg total) by mouth daily., Disp: 30 tablet, Rfl: 3 .  lisinopril (PRINIVIL,ZESTRIL) 40 MG tablet, Take 1 tablet (40 mg total) by mouth daily. bedtime, Disp: 90 tablet, Rfl: 3 .  omeprazole (PRILOSEC) 20 MG capsule, Take 1 capsule (20 mg total) by mouth daily., Disp: 90 capsule, Rfl: 3 .  simvastatin (ZOCOR) 20 MG tablet, Take 1 tablet (20 mg total) by mouth daily. bedtime (Patient taking differently: Take 20 mg by mouth daily at 6 PM. bedtime), Disp: 90 tablet, Rfl: 3 .  Turmeric 450 MG CAPS, Take 450 mg by mouth daily., Disp: , Rfl:  .  vitamin B-12 (CYANOCOBALAMIN) 1000 MCG tablet, Take 1,000 mcg by mouth daily. am, Disp: , Rfl:  No Known Allergies  Social History  Substance Use Topics  . Smoking status: Former Smoker    Packs/day: 0.50    Years: 20.00    Types: Cigarettes    Quit date: 06/20/1977  . Smokeless tobacco: Never Used  . Alcohol use 0.6 - 1.8 oz/week    1 - 2 Standard drinks or equivalent per week    Family History  Problem Relation Age of Onset  .  Heart disease Mother     CHF, CAD  . Hypertension Mother   . Mental illness Mother     Dementia  . Cancer Mother     breast, Brain Cancer  . Breast cancer Mother 86  . Cancer Father     lung  . Heart disease Father     MI  . Heart disease Sister   . Cancer Sister     Breast  . Barrett's esophagus Sister   . Hyperlipidemia Sister   . Breast cancer Sister 75  . Asthma Brother   . Heart disease Brother   . Diabetes Brother   . Anxiety disorder Brother   . Neuropathy Brother   . Neuropathy Sister   . Anxiety disorder Sister   . Mental illness Sister     Dementia  . Fibromyalgia Sister      Review of Systems  Constitutional: Negative.   HENT: Negative.   Eyes: Negative.   Respiratory: Negative.   Cardiovascular: Negative.  Gastrointestinal: Negative.   Genitourinary: Negative.   Musculoskeletal: Positive for back pain and joint pain.  Skin: Negative.   Neurological: Negative.   Endo/Heme/Allergies: Negative.   Psychiatric/Behavioral: Negative.     Objective:  Physical Exam  Constitutional: She is oriented to person, place, and time. She appears well-developed and well-nourished.  HENT:  Head: Normocephalic and atraumatic.  Mouth/Throat: Oropharynx is clear and moist.  Eyes: Conjunctivae are normal. Pupils are equal, round, and reactive to light.  Neck: Neck supple.  Cardiovascular: Normal rate and regular rhythm.   Respiratory: Effort normal and breath sounds normal.  GI: Soft. Bowel sounds are normal.  Musculoskeletal:  Examination of bilateral knees right is more painful than left .  1+ crepitation.  1+ synovitis.  Range of motion -5 to 100 degrees.  Knees are stable with normal patella tracking.  Vascular exam: Pulses are 2+ and symmetric. Neurologic exam: Distal motor and sensory examination is within normal limits.   Neurological: She is alert and oriented to person, place, and time.  Skin: Skin is warm and dry.  Psychiatric: She has a normal mood and  affect. Her behavior is normal.    Vital signs in last 24 hours: Temp:  [97.8 F (36.6 C)] 97.8 F (36.6 C) (02/07 1400) Pulse Rate:  [79] 79 (02/07 1400) BP: (158)/(83) 158/83 (02/07 1400) SpO2:  [98 %] 98 % (02/07 1400) Weight:  [95.3 kg (210 lb)] 95.3 kg (210 lb) (02/07 1400)  Labs:   Estimated body mass index is 38.41 kg/m as calculated from the following:   Height as of this encounter: 5\' 2"  (1.575 m).   Weight as of this encounter: 95.3 kg (210 lb).   Imaging Review Plain radiographs demonstrate severe degenerative joint disease of the right knee(s). The overall alignment issignificant varus. The bone quality appears to be good for age and reported activity level.  Assessment/Plan:  End stage arthritis, right knee  Active Problems:   Esophageal reflux   Hypothyroidism   Barrett's esophagus   Hypercholesteremia   Hypertension   Vitamin D deficiency   Sleep apnea   Carotid artery narrowing   MI (mitral incompetence)   Benign essential HTN   Primary localized osteoarthrosis of the knee, right   The patient history, physical examination, clinical judgment of the provider and imaging studies are consistent with end stage degenerative joint disease of the right knee(s) and total knee arthroplasty is deemed medically necessary. The treatment options including medical management, injection therapy arthroscopy and arthroplasty were discussed at length. The risks and benefits of total knee arthroplasty were presented and reviewed. The risks due to aseptic loosening, infection, stiffness, patella tracking problems, thromboembolic complications and other imponderables were discussed. The patient acknowledged the explanation, agreed to proceed with the plan and consent was signed. Patient is being admitted for inpatient treatment for surgery, pain control, PT, OT, prophylactic antibiotics, VTE prophylaxis, progressive ambulation and ADL's and discharge planning. The patient is  planning to be discharged to skilled nursing facility Patient want to go to [facility] in [place] or [facility] in [place] or [facility] in [place].

## 2016-07-29 NOTE — Pre-Procedure Instructions (Addendum)
    HELAINE BURFIELD  07/29/2016      [place] [place] [address] [place] [address] Phone: [phone] Fax: [phone]  [facility] [phone] [place], [place] [address] Phone: [phone] Fax: [phone]    Your procedure is scheduled on Mon. Feb. 19  Report to [facility] Admitting at 5:30 A.M.  Call this number if you have problems the morning of surgery:  [phone]   Remember:  Do not eat food or drink liquids after midnight on Sun. Feb. 18   Take these medicines the morning of surgery with A SIP OF WATER : tylenol if needed, refresh eye drops if needed, levothyroxine(synthroid)            1 week prior to surgery stop:  advil, aleve, ibuprofen, motrin, celebrex, BC Powders, Goody's, vitamins/herbal medicines             Continue aspirin per Erasmo Downer PA for Dr. Noemi Chapel   Do not wear jewelry, make-up or nail polish.  Do not wear lotions, powders, or perfumes, or deoderant.  Do not shave 48 hours prior to surgery.  Men may shave face and neck.  Do not bring valuables to the hospital.  [facility] is not responsible for any belongings or valuables.  Contacts, dentures or bridgework may not be worn into surgery.  Leave your suitcase in the car.  After surgery it may be brought to your room.  For patients admitted to the hospital, discharge time will be determined by your treatment team.  Patients discharged the day of surgery will not be allowed to drive home.    Special instructions:  Review preparing for surgery  Please read over the following fact sheets that you were given. Coughing and Deep Breathing, Total Joint Packet and MRSA Information

## 2016-07-30 ENCOUNTER — Encounter (HOSPITAL_COMMUNITY)
Admission: RE | Admit: 2016-07-30 | Discharge: 2016-07-30 | Disposition: A | Discharge status: Home / Self Care | Payer: Medicare Other | Source: Ambulatory Visit | Attending: Orthopedic Surgery | Admitting: Orthopedic Surgery

## 2016-07-30 ENCOUNTER — Encounter (HOSPITAL_COMMUNITY): Payer: Self-pay

## 2016-07-30 DIAGNOSIS — Z01812 Encounter for preprocedural laboratory examination: Secondary | ICD-10-CM | POA: Diagnosis present

## 2016-07-30 DIAGNOSIS — M1711 Unilateral primary osteoarthritis, right knee: Secondary | ICD-10-CM | POA: Diagnosis not present

## 2016-07-30 HISTORY — DX: Cardiac murmur, unspecified: R01.1

## 2016-07-30 LAB — COMPREHENSIVE METABOLIC PANEL
ALT: 17 U/L (ref 14–54)
ANION GAP: 9 (ref 5–15)
AST: 19 U/L (ref 15–41)
Albumin: 4 g/dL (ref 3.5–5.0)
Alkaline Phosphatase: 91 U/L (ref 38–126)
BUN: 7 mg/dL (ref 6–20)
CO2: 27 mmol/L (ref 22–32)
Calcium: 9.6 mg/dL (ref 8.9–10.3)
Chloride: 94 mmol/L — ABNORMAL LOW (ref 101–111)
Creatinine, Ser: 0.62 mg/dL (ref 0.44–1.00)
GFR calc non Af Amer: >60 mL/min (ref >60)
GLUCOSE: 99 mg/dL (ref 65–99)
POTASSIUM: 4 mmol/L (ref 3.5–5.1)
SODIUM: 130 mmol/L — AB (ref 135–145)
TOTAL PROTEIN: 7.2 g/dL (ref 6.5–8.1)
Total Bilirubin: 0.7 mg/dL (ref 0.3–1.2)

## 2016-07-30 LAB — CBC WITH DIFFERENTIAL/PLATELET
BASOS PCT: 1 %
Basophils Absolute: 0 10*3/uL (ref 0.0–0.1)
EOS ABS: 0.2 10*3/uL (ref 0.0–0.7)
EOS PCT: 3 %
HCT: 39 % (ref 36.0–46.0)
Hemoglobin: 12.7 g/dL (ref 12.0–15.0)
Lymphocytes Relative: 17 %
Lymphs Abs: 1 10*3/uL (ref 0.7–4.0)
MCH: 28.6 pg (ref 26.0–34.0)
MCHC: 32.6 g/dL (ref 30.0–36.0)
MCV: 87.8 fL (ref 78.0–100.0)
MONO ABS: 0.3 10*3/uL (ref 0.1–1.0)
MONOS PCT: 5 %
Neutro Abs: 4.7 10*3/uL (ref 1.7–7.7)
Neutrophils Relative %: 74 %
Platelets: 301 10*3/uL (ref 150–400)
RBC: 4.44 MIL/uL (ref 3.87–5.11)
RDW: 13.4 % (ref 11.5–15.5)
WBC: 6.2 10*3/uL (ref 4.0–10.5)

## 2016-07-30 LAB — APTT: aPTT: 37 seconds — ABNORMAL HIGH (ref 24–36)

## 2016-07-30 LAB — TYPE AND SCREEN
ABO/RH(D): O POS
ANTIBODY SCREEN: NEGATIVE

## 2016-07-30 LAB — PROTIME-INR
INR: 0.96
Prothrombin Time: 12.8 seconds (ref 11.4–15.2)

## 2016-07-30 LAB — ABO/RH: ABO/RH(D): O POS

## 2016-07-30 LAB — SURGICAL PCR SCREEN
MRSA, PCR: NEGATIVE
Staphylococcus aureus: NEGATIVE

## 2016-07-30 NOTE — Progress Notes (Signed)
PCP: Dr. Bobette Mo @ [facility] Cardiologist: Dr. Serafina Royals @ [facility] in [place]--will request ekg/echo/stress/notes

## 2016-07-31 LAB — URINE CULTURE: CULTURE: NO GROWTH

## 2016-08-02 NOTE — Progress Notes (Addendum)
Anesthesia chart review: Patient is a 76 year old female scheduled for right total knee replacement on 08/09/2016 by Dr. Noemi Chapel. Case is posted for spinal anesthesia.  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 [facility] notes indicate that she has mild < 50 % carotid atherosclerosis. BMI is consistent with obesity.  PCP is Dr. Arrie Aran with [facility] (see Care Everywhere). Cardiologist is Dr. Serafina Royals with [facility] (see Care Everywhere), last visit 07/15/16. He wrote, "Proceed to surgery and/or invasive procedure without restriction to pre or post operative and/or procedural care. The patient is at lowest risk possible for cardiovascular complications with surgical intervention and/or invasive procedure. Currently has no evidence active and/or significant angina and/or congestive heart failure. The patient may discontinue aspirin 7 days prior to procedure and restart at a safe period thereafter."  Meds include aspirin 81 mg, calcium with vitamin D, Celebrex, HCTZ, levothyroxine, lisinopril, Prilosec, Zocor, turmeric.  BP (!) 178/88 Comment: taken manually/notified Hotel manager  Pulse 64   Temp 36.3 C   Resp 20   Ht 5\' 7"  (1.702 m)   Wt 211 lb (95.7 kg)   SpO2 100%   BMI 33.05 kg/m   BP was not rechecked at PAT. BP on 07/28/16 was 158/83 and 150/88 on 07/15/16.   EKG 11/13/15 [facility]): Result Narrative ONLY: NSR. (Tracing requested, but is still pending.)  Echo 08/25/13 ([facility]; Care Everywhere): INTERPRETATION NORMAL LEFT VENTRICULAR SYSTOLIC FUNCTION. Normal LV size. No LVH. NORMAL RIGHT VENTRICULAR SYSTOLIC FUNCTION MILD VALVULAR REGURGITATION - Mild MR/TR, moderate PR NO VALVULAR STENOSIS  Patient thought she may have had a  stress test within the past five years. One is not mentioned in notes, but I asked [facility] to send if one was done within the past three years. Last carotid Duplex also requested as available.   Preoperative labs noted. Na 130, Cr 0.62. CBC WNL. PT/INR WNL. PTT 37. Glucose 99. Urine culture showed no growth.  If additional cardiac/vascular studies received then I will plan to update my note. She has already been cleared by cardiology. BP was elevated at PAT. She will get vitals on arrival. If results acceptable and otherwise no acute changes then I would anticipate that she could proceed as planned.  [facility] Stay Center/Anesthesiology Phone [phone] 08/02/2016 12:19 PM  Addendum: Additional records received from [facility]. 11/13/15 EKG showed NSR.  Treadmill exercise stress test 02/02/12: Conclusion: Normal treadmill EKG without evidence of ischemia or arrhythmia.  Carotid Duplex 07/03/13: Impression: Less than 50% stenosis in the right and left internal carotid arteries.  No change in above plan. Further evaluation on the day of surgery by her anesthesia team.  Myra Gianotti, PA-C [facility] Short Stay Center/Anesthesiology Phone [phone] 08/04/2016 12:48 PM

## 2016-08-08 NOTE — Anesthesia Preprocedure Evaluation (Addendum)
Anesthesia Evaluation  Patient identified by MRN, date of birth, ID band Patient awake    Reviewed: Allergy & Precautions, NPO status , Patient's Chart, lab work & pertinent test results  History of Anesthesia Complications Negative for: history of anesthetic complications  Airway Mallampati: II  TM Distance: >3 FB Neck ROM: Limited    Dental  (+) Edentulous Upper, Edentulous Lower, Dental Advisory Given   Pulmonary sleep apnea and Continuous Positive Airway Pressure Ventilation , former smoker,    Pulmonary exam normal        Cardiovascular hypertension, + Peripheral Vascular Disease  Normal cardiovascular exam     Neuro/Psych negative neurological ROS  negative psych ROS   GI/Hepatic Neg liver ROS, GERD  ,  Endo/Other  Hypothyroidism   Renal/GU negative Renal ROS     Musculoskeletal   Abdominal   Peds  Hematology   Anesthesia Other Findings   Reproductive/Obstetrics                            Anesthesia Physical Anesthesia Plan  ASA: III  Anesthesia Plan: MAC and Spinal   Post-op Pain Management:  Regional for Post-op pain   Induction:   Airway Management Planned: Natural Airway and Simple Face Mask  Additional Equipment:   Intra-op Plan:   Post-operative Plan:   Informed Consent: I have reviewed the patients History and Physical, chart, labs and discussed the procedure including the risks, benefits and alternatives for the proposed anesthesia with the patient or authorized representative who has indicated his/her understanding and acceptance.   Dental advisory given  Plan Discussed with: Anesthesiologist, Surgeon and CRNA  Anesthesia Plan Comments:        Anesthesia Quick Evaluation

## 2016-08-09 ENCOUNTER — Inpatient Hospital Stay (HOSPITAL_COMMUNITY): Payer: Medicare Other | Admitting: Anesthesiology

## 2016-08-09 ENCOUNTER — Inpatient Hospital Stay (HOSPITAL_COMMUNITY): Payer: Medicare Other | Admitting: Vascular Surgery

## 2016-08-09 ENCOUNTER — Encounter (HOSPITAL_COMMUNITY)
Admission: RE | Disposition: A | Discharge status: Home Health | Payer: Self-pay | Source: Ambulatory Visit | Attending: Orthopedic Surgery

## 2016-08-09 ENCOUNTER — Inpatient Hospital Stay (HOSPITAL_COMMUNITY)
Admission: RE | Admit: 2016-08-09 | Discharge: 2016-08-11 | DRG: 470 | Disposition: A | Discharge status: Home Health | Payer: Medicare Other | Source: Ambulatory Visit | Attending: Orthopedic Surgery | Admitting: Orthopedic Surgery

## 2016-08-09 ENCOUNTER — Encounter (HOSPITAL_COMMUNITY): Payer: Self-pay | Admitting: *Deleted

## 2016-08-09 DIAGNOSIS — E785 Hyperlipidemia, unspecified: Secondary | ICD-10-CM | POA: Diagnosis present

## 2016-08-09 DIAGNOSIS — E559 Vitamin D deficiency, unspecified: Secondary | ICD-10-CM | POA: Diagnosis present

## 2016-08-09 DIAGNOSIS — K219 Gastro-esophageal reflux disease without esophagitis: Secondary | ICD-10-CM | POA: Diagnosis not present

## 2016-08-09 DIAGNOSIS — E039 Hypothyroidism, unspecified: Secondary | ICD-10-CM | POA: Diagnosis not present

## 2016-08-09 DIAGNOSIS — Z801 Family history of malignant neoplasm of trachea, bronchus and lung: Secondary | ICD-10-CM

## 2016-08-09 DIAGNOSIS — I10 Essential (primary) hypertension: Secondary | ICD-10-CM | POA: Diagnosis not present

## 2016-08-09 DIAGNOSIS — K2270 Barrett's esophagus without dysplasia: Secondary | ICD-10-CM | POA: Diagnosis present

## 2016-08-09 DIAGNOSIS — I6529 Occlusion and stenosis of unspecified carotid artery: Secondary | ICD-10-CM | POA: Diagnosis present

## 2016-08-09 DIAGNOSIS — Z825 Family history of asthma and other chronic lower respiratory diseases: Secondary | ICD-10-CM | POA: Diagnosis not present

## 2016-08-09 DIAGNOSIS — E7800 Pure hypercholesterolemia, unspecified: Secondary | ICD-10-CM | POA: Diagnosis not present

## 2016-08-09 DIAGNOSIS — G4730 Sleep apnea, unspecified: Secondary | ICD-10-CM | POA: Diagnosis not present

## 2016-08-09 DIAGNOSIS — Z833 Family history of diabetes mellitus: Secondary | ICD-10-CM

## 2016-08-09 DIAGNOSIS — Z803 Family history of malignant neoplasm of breast: Secondary | ICD-10-CM | POA: Diagnosis not present

## 2016-08-09 DIAGNOSIS — Z87891 Personal history of nicotine dependence: Secondary | ICD-10-CM | POA: Diagnosis not present

## 2016-08-09 DIAGNOSIS — M1711 Unilateral primary osteoarthritis, right knee: Secondary | ICD-10-CM | POA: Diagnosis not present

## 2016-08-09 DIAGNOSIS — I340 Nonrheumatic mitral (valve) insufficiency: Secondary | ICD-10-CM | POA: Diagnosis present

## 2016-08-09 DIAGNOSIS — Z8249 Family history of ischemic heart disease and other diseases of the circulatory system: Secondary | ICD-10-CM | POA: Diagnosis not present

## 2016-08-09 HISTORY — PX: TOTAL KNEE ARTHROPLASTY: SHX125

## 2016-08-09 SURGERY — ARTHROPLASTY, KNEE, TOTAL
Anesthesia: Monitor Anesthesia Care | Laterality: Right

## 2016-08-09 MED ORDER — FENTANYL CITRATE (PF) 100 MCG/2ML IJ SOLN
INTRAMUSCULAR | Status: AC
  Filled 2016-08-09: qty 2

## 2016-08-09 MED ORDER — PHENOL 1.4 % MT LIQD
1.0000 | OROMUCOSAL | Status: DC | PRN
Start: 2016-08-09 — End: 2016-08-11

## 2016-08-09 MED ORDER — BUPIVACAINE-EPINEPHRINE 0.25% -1:200000 IJ SOLN
INTRAMUSCULAR | Status: DC | PRN
  Administered 2016-08-09: 30 mL

## 2016-08-09 MED ORDER — ONDANSETRON HCL 4 MG PO TABS
4.0000 mg | ORAL_TABLET | Freq: Four times a day (QID) | ORAL | Status: DC | PRN
  Administered 2016-08-09: 4 mg via ORAL
  Filled 2016-08-09: qty 1

## 2016-08-09 MED ORDER — ASPIRIN EC 325 MG PO TBEC
325.0000 mg | DELAYED_RELEASE_TABLET | Freq: Every day | ORAL | Status: DC
  Administered 2016-08-10 – 2016-08-11 (×2): 325 mg via ORAL
  Filled 2016-08-09 (×2): qty 1

## 2016-08-09 MED ORDER — HYDROMORPHONE HCL 2 MG/ML IJ SOLN: 1.0000 mg | INTRAMUSCULAR | Status: DC | PRN

## 2016-08-09 MED ORDER — PROPOFOL 500 MG/50ML IV EMUL
INTRAVENOUS | Status: DC | PRN
  Administered 2016-08-09: 40 ug/kg/min via INTRAVENOUS

## 2016-08-09 MED ORDER — CHLORHEXIDINE GLUCONATE 4 % EX LIQD: 60.0000 mL | Freq: Once | CUTANEOUS | Status: DC

## 2016-08-09 MED ORDER — MIDAZOLAM HCL 5 MG/5ML IJ SOLN
INTRAMUSCULAR | Status: DC | PRN
  Administered 2016-08-09: 1 mg via INTRAVENOUS

## 2016-08-09 MED ORDER — ACETAMINOPHEN 325 MG PO TABS
650.0000 mg | ORAL_TABLET | Freq: Four times a day (QID) | ORAL | Status: DC | PRN
  Administered 2016-08-09: 650 mg via ORAL
  Filled 2016-08-09: qty 2

## 2016-08-09 MED ORDER — PANTOPRAZOLE SODIUM 40 MG PO TBEC
40.0000 mg | DELAYED_RELEASE_TABLET | Freq: Every day | ORAL | Status: DC
  Administered 2016-08-09 – 2016-08-10 (×2): 40 mg via ORAL
  Filled 2016-08-09 (×3): qty 1

## 2016-08-09 MED ORDER — SIMVASTATIN 20 MG PO TABS
20.0000 mg | ORAL_TABLET | Freq: Every day | ORAL | Status: DC
  Administered 2016-08-09 – 2016-08-10 (×2): 20 mg via ORAL
  Filled 2016-08-09 (×2): qty 1

## 2016-08-09 MED ORDER — ALUM & MAG HYDROXIDE-SIMETH 200-200-20 MG/5ML PO SUSP
30.0000 mL | ORAL | Status: DC | PRN
  Administered 2016-08-10 (×2): 30 mL via ORAL
  Filled 2016-08-09 (×2): qty 30

## 2016-08-09 MED ORDER — LEVOTHYROXINE SODIUM 25 MCG PO TABS
125.0000 ug | ORAL_TABLET | Freq: Every day | ORAL | Status: DC
  Administered 2016-08-10 – 2016-08-11 (×2): 125 ug via ORAL
  Filled 2016-08-09 (×2): qty 1

## 2016-08-09 MED ORDER — PROPOFOL 10 MG/ML IV BOLUS
INTRAVENOUS | Status: AC
  Filled 2016-08-09: qty 20

## 2016-08-09 MED ORDER — CEFAZOLIN SODIUM-DEXTROSE 2-4 GM/100ML-% IV SOLN
2.0000 g | INTRAVENOUS | Status: AC
Start: 2016-08-09 — End: 2016-08-09
  Administered 2016-08-09: 2 g via INTRAVENOUS
  Filled 2016-08-09: qty 100

## 2016-08-09 MED ORDER — METOCLOPRAMIDE HCL 5 MG/ML IJ SOLN: 5.0000 mg | Freq: Three times a day (TID) | INTRAMUSCULAR | Status: DC | PRN

## 2016-08-09 MED ORDER — LACTATED RINGERS IV SOLN
INTRAVENOUS | Status: DC
  Administered 2016-08-09 (×2): via INTRAVENOUS

## 2016-08-09 MED ORDER — CELECOXIB 200 MG PO CAPS
200.0000 mg | ORAL_CAPSULE | Freq: Two times a day (BID) | ORAL | Status: DC
  Administered 2016-08-09 – 2016-08-11 (×4): 200 mg via ORAL
  Filled 2016-08-09 (×5): qty 1

## 2016-08-09 MED ORDER — BUPIVACAINE IN DEXTROSE 0.75-8.25 % IT SOLN
INTRATHECAL | Status: DC | PRN
  Administered 2016-08-09: 15 mg via INTRATHECAL

## 2016-08-09 MED ORDER — PROMETHAZINE HCL 25 MG/ML IJ SOLN: 6.2500 mg | INTRAMUSCULAR | Status: DC | PRN

## 2016-08-09 MED ORDER — POLYVINYL ALCOHOL 1.4 % OP SOLN
1.0000 [drp] | Freq: Every day | OPHTHALMIC | Status: DC | PRN
  Filled 2016-08-09: qty 15

## 2016-08-09 MED ORDER — MIDAZOLAM HCL 2 MG/2ML IJ SOLN
INTRAMUSCULAR | Status: AC
  Filled 2016-08-09: qty 2

## 2016-08-09 MED ORDER — POTASSIUM CHLORIDE IN NACL 20-0.9 MEQ/L-% IV SOLN
INTRAVENOUS | Status: DC
  Administered 2016-08-09: 13:00:00 via INTRAVENOUS
  Filled 2016-08-09: qty 1000

## 2016-08-09 MED ORDER — HYDROMORPHONE HCL 1 MG/ML IJ SOLN: 0.2500 mg | INTRAMUSCULAR | Status: DC | PRN

## 2016-08-09 MED ORDER — SODIUM CHLORIDE 0.9 % IR SOLN
Status: DC | PRN
  Administered 2016-08-09: 3000 mL

## 2016-08-09 MED ORDER — EPHEDRINE SULFATE 50 MG/ML IJ SOLN
INTRAMUSCULAR | Status: DC | PRN
  Administered 2016-08-09: 15 mg via INTRAVENOUS

## 2016-08-09 MED ORDER — METOCLOPRAMIDE HCL 5 MG PO TABS: 5.0000 mg | ORAL_TABLET | Freq: Three times a day (TID) | ORAL | Status: DC | PRN

## 2016-08-09 MED ORDER — ROPIVACAINE HCL 7.5 MG/ML IJ SOLN
INTRAMUSCULAR | Status: DC | PRN
  Administered 2016-08-09: 20 mL via PERINEURAL

## 2016-08-09 MED ORDER — PROPOFOL 10 MG/ML IV BOLUS
INTRAVENOUS | Status: DC | PRN
  Administered 2016-08-09: 30 mg via INTRAVENOUS
  Administered 2016-08-09: 20 mg via INTRAVENOUS

## 2016-08-09 MED ORDER — MENTHOL 3 MG MT LOZG: 1.0000 | LOZENGE | OROMUCOSAL | Status: DC | PRN

## 2016-08-09 MED ORDER — DEXAMETHASONE SODIUM PHOSPHATE 10 MG/ML IJ SOLN
10.0000 mg | Freq: Three times a day (TID) | INTRAMUSCULAR | Status: AC
  Administered 2016-08-09 – 2016-08-10 (×4): 10 mg via INTRAVENOUS
  Filled 2016-08-09 (×4): qty 1

## 2016-08-09 MED ORDER — ONDANSETRON HCL 4 MG/2ML IJ SOLN
4.0000 mg | Freq: Four times a day (QID) | INTRAMUSCULAR | Status: DC | PRN
  Administered 2016-08-10: 4 mg via INTRAVENOUS
  Filled 2016-08-09 (×2): qty 2

## 2016-08-09 MED ORDER — SODIUM CHLORIDE 0.9 % IV SOLN
1000.0000 mg | INTRAVENOUS | Status: AC
  Administered 2016-08-09: 1000 mg via INTRAVENOUS
  Filled 2016-08-09: qty 10

## 2016-08-09 MED ORDER — POLYVINYL ALCOHOL 1.4 % OP SOLN
1.0000 [drp] | Freq: Every day | OPHTHALMIC | Status: DC
  Administered 2016-08-10: 1 [drp] via OPHTHALMIC
  Filled 2016-08-09: qty 15

## 2016-08-09 MED ORDER — POLYETHYLENE GLYCOL 3350 17 G PO PACK
17.0000 g | PACK | Freq: Two times a day (BID) | ORAL | Status: DC
  Administered 2016-08-09 – 2016-08-11 (×5): 17 g via ORAL
  Filled 2016-08-09 (×5): qty 1

## 2016-08-09 MED ORDER — DOCUSATE SODIUM 100 MG PO CAPS
100.0000 mg | ORAL_CAPSULE | Freq: Two times a day (BID) | ORAL | Status: DC
  Administered 2016-08-09 – 2016-08-11 (×5): 100 mg via ORAL
  Filled 2016-08-09 (×5): qty 1

## 2016-08-09 MED ORDER — DEXAMETHASONE SODIUM PHOSPHATE 10 MG/ML IJ SOLN
INTRAMUSCULAR | Status: DC | PRN
  Administered 2016-08-09: 10 mg via INTRAVENOUS

## 2016-08-09 MED ORDER — OXYCODONE HCL 5 MG PO TABS
5.0000 mg | ORAL_TABLET | ORAL | Status: DC | PRN
  Administered 2016-08-09 (×2): 5 mg via ORAL
  Administered 2016-08-09 – 2016-08-10 (×3): 10 mg via ORAL
  Administered 2016-08-10: 5 mg via ORAL
  Administered 2016-08-10: 10 mg via ORAL
  Administered 2016-08-10: 5 mg via ORAL
  Administered 2016-08-10 – 2016-08-11 (×2): 10 mg via ORAL
  Filled 2016-08-09 (×4): qty 2
  Filled 2016-08-09: qty 1
  Filled 2016-08-09 (×3): qty 2
  Filled 2016-08-09: qty 1
  Filled 2016-08-09 (×2): qty 2
  Filled 2016-08-09 (×2): qty 1

## 2016-08-09 MED ORDER — CEFAZOLIN SODIUM-DEXTROSE 2-4 GM/100ML-% IV SOLN
2.0000 g | Freq: Four times a day (QID) | INTRAVENOUS | Status: AC
  Administered 2016-08-09 (×2): 2 g via INTRAVENOUS
  Filled 2016-08-09 (×2): qty 100

## 2016-08-09 MED ORDER — CALCIUM CARBONATE-VITAMIN D 500-200 MG-UNIT PO TABS
1.0000 | ORAL_TABLET | Freq: Every day | ORAL | Status: DC
  Administered 2016-08-10: 1 via ORAL
  Filled 2016-08-09 (×2): qty 1

## 2016-08-09 MED ORDER — ACETAMINOPHEN 650 MG RE SUPP: 650.0000 mg | Freq: Four times a day (QID) | RECTAL | Status: DC | PRN

## 2016-08-09 MED ORDER — VITAMIN D 1000 UNITS PO TABS
2000.0000 [IU] | ORAL_TABLET | Freq: Every evening | ORAL | Status: DC
  Administered 2016-08-09 – 2016-08-10 (×2): 2000 [IU] via ORAL
  Filled 2016-08-09 (×2): qty 2

## 2016-08-09 MED ORDER — DIPHENHYDRAMINE HCL 12.5 MG/5ML PO ELIX: 12.5000 mg | ORAL_SOLUTION | ORAL | Status: DC | PRN

## 2016-08-09 MED ORDER — POVIDONE-IODINE 7.5 % EX SOLN: Freq: Once | CUTANEOUS | Status: DC

## 2016-08-09 MED ORDER — CARBOXYMETHYLCELLULOSE SODIUM 0.5 % OP SOLN: 1.0000 [drp] | Freq: Every day | OPHTHALMIC | Status: DC | PRN

## 2016-08-09 MED ORDER — FENTANYL CITRATE (PF) 100 MCG/2ML IJ SOLN
INTRAMUSCULAR | Status: DC | PRN
Start: 2016-08-09 — End: 2016-08-09
  Administered 2016-08-09: 50 ug via INTRAVENOUS

## 2016-08-09 SURGICAL SUPPLY — 72 items
APL SKNCLS STERI-STRIP NONHPOA (GAUZE/BANDAGES/DRESSINGS) ×1
BANDAGE ESMARK 6X9 LF (GAUZE/BANDAGES/DRESSINGS) ×1 IMPLANT
BENZOIN TINCTURE PRP APPL 2/3 (GAUZE/BANDAGES/DRESSINGS) ×2 IMPLANT
BLADE SAGITTAL 25.0X1.19X90 (BLADE) ×2 IMPLANT
BLADE SAW SGTL 13X75X1.27 (BLADE) ×2 IMPLANT
BLADE SURG 10 STRL SS (BLADE) ×4 IMPLANT
BNDG CMPR 9X6 STRL LF SNTH (GAUZE/BANDAGES/DRESSINGS) ×1
BNDG CMPR MED 15X6 ELC VLCR LF (GAUZE/BANDAGES/DRESSINGS) ×1
BNDG ELASTIC 6X15 VLCR STRL LF (GAUZE/BANDAGES/DRESSINGS) ×2 IMPLANT
BNDG ESMARK 6X9 LF (GAUZE/BANDAGES/DRESSINGS) ×2
BOWL SMART MIX CTS (DISPOSABLE) ×2 IMPLANT
CAPT KNEE TOTAL 3 ATTUNE ×1 IMPLANT
CEMENT HV SMART SET (Cement) ×4 IMPLANT
COVER SURGICAL LIGHT HANDLE (MISCELLANEOUS) ×2 IMPLANT
CUFF TOURNIQUET SINGLE 34IN LL (TOURNIQUET CUFF) ×2 IMPLANT
CUFF TOURNIQUET SINGLE 44IN (TOURNIQUET CUFF) IMPLANT
DECANTER SPIKE VIAL GLASS SM (MISCELLANEOUS) ×1 IMPLANT
DRAPE EXTREMITY T 121X128X90 (DRAPE) ×2 IMPLANT
DRAPE HALF SHEET 40X57 (DRAPES) ×2 IMPLANT
DRAPE INCISE IOBAN 66X45 STRL (DRAPES) ×1 IMPLANT
DRAPE PROXIMA HALF (DRAPES) ×2 IMPLANT
DRAPE U-SHAPE 47X51 STRL (DRAPES) ×2 IMPLANT
DRSG AQUACEL AG ADV 3.5X14 (GAUZE/BANDAGES/DRESSINGS) ×2 IMPLANT
DURAPREP 26ML APPLICATOR (WOUND CARE) ×4 IMPLANT
ELECT CAUTERY BLADE 6.4 (BLADE) ×2 IMPLANT
ELECT REM PT RETURN 9FT ADLT (ELECTROSURGICAL) ×2
ELECTRODE REM PT RTRN 9FT ADLT (ELECTROSURGICAL) ×1 IMPLANT
FACESHIELD WRAPAROUND (MASK) ×2 IMPLANT
FACESHIELD WRAPAROUND OR TEAM (MASK) ×1 IMPLANT
GLOVE BIO SURGEON STRL SZ7 (GLOVE) ×2 IMPLANT
GLOVE BIOGEL PI IND STRL 7.0 (GLOVE) ×1 IMPLANT
GLOVE BIOGEL PI IND STRL 7.5 (GLOVE) ×1 IMPLANT
GLOVE BIOGEL PI INDICATOR 7.0 (GLOVE) ×1
GLOVE BIOGEL PI INDICATOR 7.5 (GLOVE) ×1
GLOVE SS BIOGEL STRL SZ 7.5 (GLOVE) ×1 IMPLANT
GLOVE SUPERSENSE BIOGEL SZ 7.5 (GLOVE) ×1
GOWN STRL REUS W/ TWL LRG LVL3 (GOWN DISPOSABLE) ×1 IMPLANT
GOWN STRL REUS W/ TWL XL LVL3 (GOWN DISPOSABLE) ×2 IMPLANT
GOWN STRL REUS W/TWL LRG LVL3 (GOWN DISPOSABLE) ×2
GOWN STRL REUS W/TWL XL LVL3 (GOWN DISPOSABLE) ×4
HANDPIECE INTERPULSE COAX TIP (DISPOSABLE) ×2
HOOD PEEL AWAY FACE SHEILD DIS (HOOD) ×4 IMPLANT
IMMOBILIZER KNEE 22 UNIV (SOFTGOODS) ×2 IMPLANT
KIT BASIN OR (CUSTOM PROCEDURE TRAY) ×2 IMPLANT
KIT ROOM TURNOVER OR (KITS) ×2 IMPLANT
MANIFOLD NEPTUNE II (INSTRUMENTS) ×2 IMPLANT
MARKER SKIN DUAL TIP RULER LAB (MISCELLANEOUS) ×2 IMPLANT
NDL 18GX1X1/2 (RX/OR ONLY) (NEEDLE) ×1 IMPLANT
NEEDLE 18GX1X1/2 (RX/OR ONLY) (NEEDLE) ×2 IMPLANT
NS IRRIG 1000ML POUR BTL (IV SOLUTION) ×2 IMPLANT
PACK TOTAL JOINT (CUSTOM PROCEDURE TRAY) ×2 IMPLANT
PAD ARMBOARD 7.5X6 YLW CONV (MISCELLANEOUS) ×4 IMPLANT
SET HNDPC FAN SPRY TIP SCT (DISPOSABLE) ×1 IMPLANT
STRIP CLOSURE SKIN 1/2X4 (GAUZE/BANDAGES/DRESSINGS) ×2 IMPLANT
SUCTION FRAZIER HANDLE 10FR (MISCELLANEOUS) ×1
SUCTION TUBE FRAZIER 10FR DISP (MISCELLANEOUS) ×1 IMPLANT
SUT MNCRL AB 3-0 PS2 18 (SUTURE) ×2 IMPLANT
SUT VIC AB 0 CT1 27 (SUTURE) ×4
SUT VIC AB 0 CT1 27XBRD ANBCTR (SUTURE) ×2 IMPLANT
SUT VIC AB 1 CT1 27 (SUTURE) ×2
SUT VIC AB 1 CT1 27XBRD ANBCTR (SUTURE) ×1 IMPLANT
SUT VIC AB 2-0 CT1 27 (SUTURE) ×4
SUT VIC AB 2-0 CT1 TAPERPNT 27 (SUTURE) ×2 IMPLANT
SYR 30ML LL (SYRINGE) ×2 IMPLANT
TOWEL OR 17X24 6PK STRL BLUE (TOWEL DISPOSABLE) ×1 IMPLANT
TOWEL OR 17X26 10 PK STRL BLUE (TOWEL DISPOSABLE) ×1 IMPLANT
TRAY CATH 16FR W/PLASTIC CATH (SET/KITS/TRAYS/PACK) IMPLANT
TRAY FOLEY BAG SILVER LF 14FR (SET/KITS/TRAYS/PACK) ×1 IMPLANT
TRAY FOLEY CATH 16FR SILVER (SET/KITS/TRAYS/PACK) ×1 IMPLANT
TUBE CONNECTING 12X1/4 (SUCTIONS) ×2 IMPLANT
WATER STERILE IRR 1000ML POUR (IV SOLUTION) ×1 IMPLANT
YANKAUER SUCT BULB TIP NO VENT (SUCTIONS) ×2 IMPLANT

## 2016-08-09 NOTE — Care Management Note (Signed)
Case Management Note  Patient Details  Name: Barbara Hernandez MRN: [id number] Date of Birth: 06-18-1941  Subjective/Objective:                    Action/Plan:  Pre arranged with [facility] for [place], will await PT recommendations. Expected Discharge Date:                  Expected Discharge Plan:  [place]  In-House Referral:     Discharge planning Services  CM Consult  Post Acute Care Choice:  Durable Medical Equipment, Home Health Choice offered to:     DME Arranged:    DME Agency:     HH Arranged:    [place] Agency:     Status of Service:  In process, will continue to follow  If discussed at Long Length of Stay Meetings, dates discussed:    Additional Comments:  Marilu Favre, RN 08/09/2016, 3:58 PM

## 2016-08-09 NOTE — Anesthesia Postprocedure Evaluation (Addendum)
Anesthesia Post Note  Patient: Barbara Hernandez  Procedure(s) Performed: Procedure(s) (LRB): TOTAL KNEE ARTHROPLASTY (Right)  Patient location during evaluation: PACU Anesthesia Type: MAC Level of consciousness: awake and alert Pain management: pain level controlled Vital Signs Assessment: post-procedure vital signs reviewed and stable Respiratory status: spontaneous breathing and respiratory function stable Cardiovascular status: blood pressure returned to baseline and stable Postop Assessment: spinal receding Anesthetic complications: no       Last Vitals:  Vitals:   08/09/16 1131 08/09/16 1145  BP: (!) 164/89   Pulse: 98 93  Resp: 16 12  Temp:  36.5 C    Last Pain:  Vitals:   08/09/16 1145  TempSrc:   PainSc: 0-No pain        RLE Motor Response: Purposeful movement;Responds to commands (08/09/16 1145) RLE Sensation: Decreased (08/09/16 1145) L Sensory Level: S1-Sole of foot, small toes (08/09/16 1145) R Sensory Level: S1-Sole of foot, small toes (08/09/16 1145)  [place]

## 2016-08-09 NOTE — Anesthesia Procedure Notes (Signed)
Procedure Name: MAC Date/Time: 08/09/2016 7:12 AM Performed by: Salli Quarry Michaelyn Wall Pre-anesthesia Checklist: Patient identified, Emergency Drugs available, Suction available and Patient being monitored Patient Re-evaluated:Patient Re-evaluated prior to inductionOxygen Delivery Method: Nasal cannula

## 2016-08-09 NOTE — Anesthesia Procedure Notes (Signed)
Spinal  Patient location during procedure: OR Start time: 08/09/2016 7:11 AM End time: 08/09/2016 7:17 AM Staffing Anesthesiologist: Duane Boston Performed: anesthesiologist  Preanesthetic Checklist Completed: patient identified, surgical consent, pre-op evaluation, timeout performed, IV checked, risks and benefits discussed and monitors and equipment checked Spinal Block Patient position: sitting Prep: DuraPrep Patient monitoring: cardiac monitor, continuous pulse ox and blood pressure Approach: midline Location: L2-3 Injection technique: single-shot Needle Needle type: Pencan  Needle gauge: 24 G Needle length: 9 cm Additional Notes Functioning IV was confirmed and monitors were applied. Sterile prep and drape, including hand hygiene and sterile gloves were used. The patient was positioned and the spine was prepped. The skin was anesthetized with lidocaine.  Free flow of clear CSF was obtained prior to injecting local anesthetic into the CSF.  The spinal needle aspirated freely following injection.  The needle was carefully withdrawn.  The patient tolerated the procedure well.

## 2016-08-09 NOTE — Op Note (Signed)
MRN:     [id number] DOB/AGE:    Sep 30, 1940 / 76 y.o.       OPERATIVE REPORT    DATE OF PROCEDURE:  08/09/2016       PREOPERATIVE DIAGNOSIS:   PRIMARY LOCALIZED OA RIGHT KNEE       Estimated body mass index is 33.05 kg/m as calculated from the following:   Height as of this encounter: 5\' 7"  (1.702 m).   Weight as of this encounter: 211 lb (95.7 kg).                                                        POSTOPERATIVE DIAGNOSIS:   same                                                                      PROCEDURE:  Procedure(s): TOTAL KNEE ARTHROPLASTY Using Depuy Attune RP implants #4 Femur, #5Tibia, 50mm  RP bearing, 29 Patella     SURGEON: Cynthea Zachman A    ASSISTANT:  Kirstin Shepperson PA-C   (Present and scrubbed throughout the case, critical for assistance with exposure, retraction, instrumentation, and closure.)         ANESTHESIA: Spinal with Adductor Nerve Block     TOURNIQUET TIME: 0000000   COMPLICATIONS:  None     SPECIMENS: None   INDICATIONS FOR PROCEDURE: The patient has  DJD RIGHT KNEE , varus deformities, XR shows bone on bone arthritis. Patient has failed all conservative measures including anti-inflammatory medicines, narcotics, attempts at  exercise and weight loss, cortisone injections and viscosupplementation.  Risks and benefits of surgery have been discussed, questions answered.   DESCRIPTION OF PROCEDURE: The patient identified by armband, received  right femoral nerve block and IV antibiotics, in the holding area at [facility]. Patient taken to the operating room, appropriate anesthetic  monitors were attached Spinal anesthesia induced with  the patient in supine position, Foley catheter was inserted. Tourniquet  applied high to the operative thigh. Lateral post and foot positioner  applied to the table, the lower extremity was then prepped and draped  in usual sterile fashion from the ankle to the tourniquet. Time-out procedure was performed. The  limb was wrapped with an Esmarch bandage and the tourniquet inflated to 365 mmHg. We began the operation by making the anterior midline incision starting at handbreadth above the patella going over the patella 1 cm medial to and  4 cm distal to the tibial tubercle. Small bleeders in the skin and the  subcutaneous tissue identified and cauterized. Transverse retinaculum was incised and reflected medially and a medial parapatellar arthrotomy was accomplished. the patella was everted and theprepatellar fat pad resected. The superficial medial collateral  ligament was then elevated from anterior to posterior along the proximal  flare of the tibia and anterior half of the menisci resected. The knee was hyperflexed exposing bone on bone arthritis. Peripheral and notch osteophytes as well as the cruciate ligaments were then resected. We continued to  work our way around posteriorly along the proximal tibia, and externally  rotated the tibia subluxing  it out from underneath the femur. A McHale  retractor was placed through the notch and a lateral Hohmann retractor  placed, and we then drilled through the proximal tibia in line with the  axis of the tibia followed by an intramedullary guide rod and 2-degree  posterior slope cutting guide. The tibial cutting guide was pinned into place  allowing resection of 4 mm of bone medially and about 6 mm of bone  laterally because of her varus deformity. Satisfied with the tibial resection, we then  entered the distal femur 2 mm anterior to the PCL origin with the  intramedullary guide rod and applied the distal femoral cutting guide  set at 76mm, with 5 degrees of valgus. This was pinned along the  epicondylar axis. At this point, the distal femoral cut was accomplished without difficulty. We then sized for a #4 femoral component and pinned the guide in 3 degrees of external rotation.The chamfer cutting guide was pinned into place. The anterior, posterior, and chamfer  cuts were accomplished without difficulty followed by  the  RP box cutting guide and the box cut. We also removed posterior osteophytes from the posterior femoral condyles. At this  time, the knee was brought into full extension. We checked our  extension and flexion gaps and found them symmetric at 67mm.  The patella thickness measured at 25 mm. We set the cutting guide at 15 and removed the posterior 9.5-10 mm  of the patella sized for 29 button and drilled the lollipop. The knee  was then once again hyperflexed exposing the proximal tibia. We sized for a #5 tibial base plate, applied the smokestack and the conical reamer followed by the the Delta fin keel punch. We then hammered into place the  RP trial femoral component, inserted a 1 trial bearing, trial patellar button, and took the knee through range of motion from 0-130 degrees. No thumb pressure was required for patellar  tracking. At this point, all trial components were removed, a double batch of DePuy HV cement  was mixed and applied to all bony metallic mating surfaces except for the posterior condyles of the femur itself. In order, we  hammered into place the tibial tray and removed excess cement, the femoral component and removed excess cement, a 4mm  RP bearing  was inserted, and the knee brought to full extension with compression.  The patellar button was clamped into place, and excess cement  removed. While the cement cured the wound was irrigated out with normal saline solution pulse lavage.. Ligament stability and patellar tracking were checked and found to be excellent.. The parapatellar arthrotomy was closed with  #1 Vicryl suture. The subcutaneous tissue with 0 and 2-0 undyed  Vicryl suture, and 4-0 Monocryl.. A dressing of Aquaseal,  4 x 4, dressing sponges, Webril, and Ace wrap applied. Needle and sponge count were correct times 2.The patient awakened, extubated, and taken to recovery room without difficulty. Vascular status was  normal, pulses 2+ and symmetric.   Horacio Werth A 08/09/2016, 8:54 AM

## 2016-08-09 NOTE — Progress Notes (Signed)
Orthopedic Tech Progress Note Patient Details:  Barbara Hernandez [birth year] [id number]  CPM Right Knee CPM Right Knee: On Right Knee Flexion (Degrees): 90 Right Knee Extension (Degrees): 0 Additional Comments: Foot roll   [name] 08/09/2016, 10:26 AM

## 2016-08-09 NOTE — Anesthesia Procedure Notes (Signed)
Anesthesia Regional Block: Adductor canal block   Pre-Anesthetic Checklist: ,, timeout performed, Correct Patient, Correct Site, Correct Laterality, Correct Procedure, Correct Position, site marked, Risks and benefits discussed,  Surgical consent,  Pre-op evaluation,  At surgeon's request and post-op pain management  Laterality: Right  Prep: chloraprep       Needles:  Injection technique: Single-shot  Needle Type: Stimulator Needle - 80     Needle Length: 10cm  Needle Gauge: 21     Additional Needles:   Procedures: ultrasound guided,,,,,,,,  Narrative:  Start time: 08/09/2016 6:57 AM End time: 08/09/2016 7:07 AM Injection made incrementally with aspirations every 5 mL.  Performed by: Personally

## 2016-08-09 NOTE — Transfer of Care (Signed)
Immediate Anesthesia Transfer of Care Note  Patient: Barbara Hernandez  Procedure(s) Performed: Procedure(s): TOTAL KNEE ARTHROPLASTY (Right)  Patient Location: PACU  Anesthesia Type:MAC and Spinal  Level of Consciousness: awake, alert  and oriented  Airway & Oxygen Therapy: Patient Spontanous Breathing  Post-op Assessment: Report given to RN and Post -op Vital signs reviewed and stable  Post vital signs: Reviewed and stable  Last Vitals:  Vitals:   08/09/16 0928 08/09/16 0930  BP: 129/84 129/64  Pulse: 82 90  Resp: 14 15  Temp: 36.1 C     Last Pain:  Vitals:   08/09/16 0623  TempSrc: Oral      Patients Stated Pain Goal: 5 (38/75/64 3329)  Complications: No apparent anesthesia complications spinal level assessed at L1, pt comfortable

## 2016-08-09 NOTE — Evaluation (Signed)
Physical Therapy Evaluation Patient Details Name: Barbara Hernandez MRN: [id number] DOB: [birth year] Today's Date: 08/09/2016   History of Present Illness  Admitted for RTKA, WBAT;  has a past medical history of Anemia; Arthritis; Barrett's esophagus (2012); Bladder tumor; BPPV (benign paroxysmal positional vertigo) (2004); Family history of adverse reaction to anesthesia;  Mild left ventricular hypertrophy  Clinical Impression   Pt is s/p TKA resulting in the deficits listed below (see PT Problem List). Mobility on today's eval limited by dizziness and nausea sitting EOB; When this clears (likely soon), I anticipate good progress; Pt has arranged for 24 hour assist at home;  Pt will benefit from skilled PT to increase their independence and safety with mobility to allow discharge to the venue listed below.      Follow Up Recommendations Home health PT;Supervision/Assistance - 24 hour    Equipment Recommendations  Rolling walker with 5" wheels;3in1 (PT) (short)    Recommendations for Other Services       Precautions / Restrictions Precautions Precautions: Knee;Fall Precaution Booklet Issued: Yes (comment) Precaution Comments: Pt educated to not allow any pillow or bolster under knee for healing with optimal range of motion.  Required Braces or Orthoses: Knee Immobilizer - Right Restrictions Weight Bearing Restrictions: Yes RLE Weight Bearing: Weight bearing as tolerated      Mobility  Bed Mobility Overal bed mobility: Needs Assistance Bed Mobility: Supine to Sit;Sit to Supine     Supine to sit: Min assist Sit to supine: Mod assist   General bed mobility comments: Cues for technique; Min assist to square off hips at EOB; mod assist to lift both LEs back into bed  Transfers                 General transfer comment: Pt felt dizzy, nauesated sitting EOB, and we ultimately opted to lay back down  Ambulation/Gait             General Gait Details: Pt felt dizzy,  nauesated sitting EOB, and we ultimately opted to lay back down  Stairs            Wheelchair Mobility    Modified Rankin (Stroke Patients Only)       Balance                                             Pertinent Vitals/Pain Pain Assessment: 0-10 Pain Score: 6  Pain Location: R knee in bone foam Pain Descriptors / Indicators: Aching Pain Intervention(s): Monitored during session    Home Living Family/patient expects to be discharged to:: Private residence Living Arrangements: Alone Available Help at Discharge: Family;Available 24 hours/day Type of Home: House Home Access: Stairs to enter Entrance Stairs-Rails: Right Entrance Stairs-Number of Steps: 4 Home Layout: One level Home Equipment: None      Prior Function Level of Independence: Independent         Comments: likes to travel     Hand Dominance        Extremity/Trunk Assessment   Upper Extremity Assessment Upper Extremity Assessment: Defer to OT evaluation    Lower Extremity Assessment Lower Extremity Assessment: RLE deficits/detail RLE Deficits / Details: Grossly decr AROM and strength; limited by pain postop; Good quad set; straight leg raise with minimal lag; AROM approx 5-75 deg       Communication   Communication: No difficulties  Cognition Arousal/Alertness: Awake/alert  Behavior During Therapy: WFL for tasks assessed/performed Overall Cognitive Status: Within Functional Limits for tasks assessed                      General Comments      Exercises Total Joint Exercises Quad Sets: AROM;Right;20 reps Heel Slides: AAROM;Right;5 reps Straight Leg Raises: AROM;Right;10 reps   Assessment/Plan    PT Assessment Patient needs continued PT services  PT Problem List Decreased strength;Decreased range of motion;Decreased activity tolerance;Decreased balance;Decreased mobility;Decreased knowledge of use of DME;Decreased knowledge of precautions;Pain        PT Treatment Interventions DME instruction;Gait training;Stair training;Functional mobility training;Therapeutic activities;Therapeutic exercise;Patient/family education;Balance training    PT Goals (Current goals can be found in the Care Plan section)  Acute Rehab PT Goals Patient Stated Goal: travel PT Goal Formulation: With patient Time For Goal Achievement: 08/16/16 Potential to Achieve Goals: Good    Frequency 7X/week   Barriers to discharge        Co-evaluation               End of Session   Activity Tolerance: Other (comment) (limited by dizziness EOB) Patient left: in bed;with call bell/phone within reach;with family/visitor present Nurse Communication: Mobility status PT Visit Diagnosis: Pain Pain - Right/Left: Right Pain - part of body: Knee         Time: [id number] PT Time Calculation (min) (ACUTE ONLY): 34 min   Charges:   PT Evaluation $PT Eval Moderate Complexity: 1 Procedure PT Treatments $Therapeutic Activity: 8-22 mins   PT G CodesColletta Hernandez 08/09/2016, 4:50 PM  Barbara Hernandez, Impact Pager [phone] Office [phone]

## 2016-08-10 ENCOUNTER — Encounter (HOSPITAL_COMMUNITY): Payer: Self-pay | Admitting: Orthopedic Surgery

## 2016-08-10 LAB — BASIC METABOLIC PANEL
Anion gap: 11 (ref 5–15)
BUN: 9 mg/dL (ref 6–20)
CO2: 22 mmol/L (ref 22–32)
CREATININE: 0.87 mg/dL (ref 0.44–1.00)
Calcium: 9 mg/dL (ref 8.9–10.3)
Chloride: 99 mmol/L — ABNORMAL LOW (ref 101–111)
GFR calc Af Amer: >60 mL/min (ref >60)
GLUCOSE: 180 mg/dL — AB (ref 65–99)
POTASSIUM: 4.2 mmol/L (ref 3.5–5.1)
SODIUM: 132 mmol/L — AB (ref 135–145)

## 2016-08-10 LAB — CBC
HCT: 35.8 % — ABNORMAL LOW (ref 36.0–46.0)
Hemoglobin: 11.4 g/dL — ABNORMAL LOW (ref 12.0–15.0)
MCH: 28.1 pg (ref 26.0–34.0)
MCHC: 31.8 g/dL (ref 30.0–36.0)
MCV: 88.2 fL (ref 78.0–100.0)
PLATELETS: 317 10*3/uL (ref 150–400)
RBC: 4.06 MIL/uL (ref 3.87–5.11)
RDW: 13.7 % (ref 11.5–15.5)
WBC: 13.5 10*3/uL — ABNORMAL HIGH (ref 4.0–10.5)

## 2016-08-10 LAB — GLUCOSE, CAPILLARY
GLUCOSE-CAPILLARY: 152 mg/dL — AB (ref 65–99)
Glucose-Capillary: 123 mg/dL — ABNORMAL HIGH (ref 65–99)

## 2016-08-10 MED ORDER — OXYCODONE HCL 5 MG PO TABS: ORAL_TABLET | ORAL | 0 refills | Status: DC

## 2016-08-10 MED ORDER — POLYETHYLENE GLYCOL 3350 17 G PO PACK: PACK | ORAL | 0 refills | Status: DC

## 2016-08-10 MED ORDER — INSULIN ASPART 100 UNIT/ML ~~LOC~~ SOLN: 0.0000 [IU] | Freq: Every day | SUBCUTANEOUS | Status: DC

## 2016-08-10 MED ORDER — INSULIN ASPART 100 UNIT/ML ~~LOC~~ SOLN
0.0000 [IU] | Freq: Three times a day (TID) | SUBCUTANEOUS | Status: DC
  Administered 2016-08-10: 3 [IU] via SUBCUTANEOUS

## 2016-08-10 MED ORDER — DOCUSATE SODIUM 100 MG PO TABS: ORAL_TABLET | ORAL | 0 refills | Status: DC

## 2016-08-10 NOTE — Progress Notes (Signed)
Physical Therapy Treatment Patient Details Name: Barbara Hernandez MRN: [id number] DOB: [birth year] Today's Date: 08/10/2016    History of Present Illness Admitted for RTKA, WBAT;  has a past medical history of Anemia; Arthritis; Barrett's esophagus (2012); Bladder tumor; BPPV (benign paroxysmal positional vertigo) (2004); Family history of adverse reaction to anesthesia;  Mild left ventricular hypertrophy    PT Comments    Patient continues to progress toward mobility goals. Tolerated increased gait distance and stair training this session. Current plan remains appropriate.    Follow Up Recommendations  Home health PT;Supervision/Assistance - 24 hour     Equipment Recommendations  Rolling walker with 5" wheels;3in1 (PT) (short)    Recommendations for Other Services       Precautions / Restrictions Precautions Precautions: Knee;Fall Precaution Booklet Issued: Yes (comment) Restrictions Weight Bearing Restrictions: Yes RLE Weight Bearing: Weight bearing as tolerated    Mobility  Bed Mobility Overal bed mobility: Modified Independent Bed Mobility: Supine to Sit;Sit to Supine           General bed mobility comments: increased time/effort  Transfers Overall transfer level: Needs assistance Equipment used: Rolling walker (2 wheeled) Transfers: Sit to/from Stand Sit to Stand: Supervision         General transfer comment: carry over of safe hand placement  Ambulation/Gait Ambulation/Gait assistance: Supervision Ambulation Distance (Feet): 150 Feet Assistive device: Rolling walker (2 wheeled) Gait Pattern/deviations: Step-through pattern;Decreased weight shift to right     General Gait Details: cues for increased R knee flexion during swing phase; improved WB and good posture/sequencing   Stairs Stairs: Yes   Stair Management: One rail Right;Step to pattern;Sideways Number of Stairs: 4 General stair comments: cues for sequencing and technique; min guard for  safety; no knee instability noted  Wheelchair Mobility    Modified Rankin (Stroke Patients Only)       Balance     Sitting balance-Leahy Scale: Good       Standing balance-Leahy Scale: Fair                      Cognition Arousal/Alertness: Awake/alert Behavior During Therapy: WFL for tasks assessed/performed Overall Cognitive Status: Within Functional Limits for tasks assessed                      Exercises Total Joint Exercises Quad Sets: AROM;Right;10 reps Short Arc Quad: AROM;Right;10 reps Heel Slides: AAROM;Right;10 reps Hip ABduction/ADduction: AROM;Right;10 reps Straight Leg Raises: AROM;Right;10 reps Long Arc Quad: AROM;Right;10 reps Goniometric ROM: 5-90    General Comments General comments (skin integrity, edema, etc.): daughter present      Pertinent Vitals/Pain Pain Assessment: Faces Pain Score: 5  Faces Pain Scale: Hurts a little bit Pain Location: R knee  Pain Descriptors / Indicators: Sore Pain Intervention(s): Monitored during session;Premedicated before session    Home Living                      Prior Function            PT Goals (current goals can now be found in the care plan section) Acute Rehab PT Goals Patient Stated Goal: travel PT Goal Formulation: With patient Time For Goal Achievement: 08/16/16 Potential to Achieve Goals: Good Progress towards PT goals: Progressing toward goals    Frequency    7X/week      PT Plan Current plan remains appropriate    Co-evaluation  End of Session Equipment Utilized During Treatment: Gait belt Activity Tolerance: Patient tolerated treatment well Patient left: with call bell/phone within reach;with family/visitor present;in bed Nurse Communication: Mobility status Pain - Right/Left: Right Pain - part of body: Knee     Time: 1540-1609 PT Time Calculation (min) (ACUTE ONLY): 29 min  Charges:  $Gait Training: 23-37 mins                      G Codes:       Salina April, PTA Pager: [phone]   08/10/2016, 4:40 PM

## 2016-08-10 NOTE — Care Management Note (Signed)
Case Management Note  Patient Details  Name: Barbara Hernandez MRN: [id number] Date of Birth: [birth year]  Subjective/Objective:  76 yr old female s/p right total knee arthroplasty.                  Action/Plan: Case manager spoke with patient and daughter concerning [place] and DME needs. Patient was preoperatively setup with [facility]. She will have family support at discharge. [place] Liaison will deliver DME to patient.   Expected Discharge Date:    08/11/16              Expected Discharge Plan:  [place]  In-House Referral:     Discharge planning Services  CM Consult  Post Acute Care Choice:  Durable Medical Equipment, Home Health Choice offered to:  Patient, Adult Children  DME Arranged:  3-N-1, CPM, Walker rolling DME Agency:  [employer]  HH Arranged:  PT [place]:  [facility] [employer]  Status of Service:  Completed, signed off  If discussed at [employer], dates discussed:    Additional Comments:  Ninfa Meeker, RN 08/10/2016, 10:45 AM

## 2016-08-10 NOTE — Progress Notes (Signed)
Subjective: 1 Day Post-Op Procedure(s) (LRB): TOTAL KNEE ARTHROPLASTY (Right) Patient reports pain as 5 on 0-10 scale.    Objective: Vital signs in last 24 hours: Temp:  [97 F (36.1 C)-98.4 F (36.9 C)] 97.5 F (36.4 C) (02/20 0530) Pulse Rate:  [78-99] 82 (02/20 0530) Resp:  [10-19] 16 (02/20 0530) BP: (123-179)/(62-99) 136/62 (02/20 0530) SpO2:  [94 %-100 %] 95 % (02/20 0530)  Intake/Output from previous day: 02/19 0701 - 02/20 0700 In: 2196.7 [P.O.:780; I.V.:1316.7; IV Piggyback:100] Out: 4500 [Urine:4450; Blood:50] Intake/Output this shift: No intake/output data recorded.  No results for input(s): HGB in the last 72 hours. No results for input(s): WBC, RBC, HCT, PLT in the last 72 hours. No results for input(s): NA, K, CL, CO2, BUN, CREATININE, GLUCOSE, CALCIUM in the last 72 hours. No results for input(s): LABPT, INR in the last 72 hours.  ABD soft Neurovascular intact Sensation intact distally Intact pulses distally Dorsiflexion/Plantar flexion intact Incision: dressing C/D/I  Assessment/Plan: 1 Day Post-Op Procedure(s) (LRB): TOTAL KNEE ARTHROPLASTY (Right)  Principal Problem:   Primary localized osteoarthrosis of the knee, right Active Problems:   Esophageal reflux   Hypothyroidism   Barrett's esophagus   Hypercholesteremia   Hypertension   Vitamin D deficiency   Sleep apnea   Carotid artery narrowing   MI (mitral incompetence)   Benign essential HTN   Primary localized osteoarthritis of right knee  Advance diet Up with therapy D/C IV fluids Plan for discharge tomorrow Discharge home with home health  Linda Hedges 08/10/2016, 9:03 AM

## 2016-08-10 NOTE — Progress Notes (Signed)
Physical Therapy Treatment Patient Details Name: Barbara Hernandez MRN: [id number] DOB: 12-28-1940 Today's Date: 08/10/2016    History of Present Illness Admitted for RTKA, WBAT;  has a past medical history of Anemia; Arthritis; Barrett's esophagus (2012); Bladder tumor; BPPV (benign paroxysmal positional vertigo) (2004); Family history of adverse reaction to anesthesia;  Mild left ventricular hypertrophy    PT Comments    Patient is progressing well toward PT goals. Patient needs to practice stairs next session.  Current plan remains appropriate.     Follow Up Recommendations  Home health PT;Supervision/Assistance - 24 hour     Equipment Recommendations  Rolling walker with 5" wheels;3in1 (PT) (short)    Recommendations for Other Services       Precautions / Restrictions Precautions Precautions: Knee;Fall Precaution Booklet Issued: Yes (comment) Precaution Comments: reviewed precautions no pillow under knee Required Braces or Orthoses: Knee Immobilizer - Right Knee Immobilizer - Right: Other (comment) (in room, not used during session) Restrictions Weight Bearing Restrictions: Yes RLE Weight Bearing: Weight bearing as tolerated    Mobility  Bed Mobility Overal bed mobility: Modified Independent Bed Mobility: Supine to Sit           General bed mobility comments: increased time/effort  Transfers Overall transfer level: Needs assistance Equipment used: Rolling walker (2 wheeled) Transfers: Sit to/from Stand Sit to Stand: Min guard         General transfer comment: vc for safe hand placement  Ambulation/Gait Ambulation/Gait assistance: Min guard Ambulation Distance (Feet): 20 Feet Assistive device: Rolling walker (2 wheeled) Gait Pattern/deviations: Step-through pattern;Decreased stance time - right;Decreased step length - left;Decreased weight shift to right     General Gait Details: cues for posture and sequencing; steady gait   Stairs             Wheelchair Mobility    Modified Rankin (Stroke Patients Only)       Balance Overall balance assessment: Needs assistance Sitting-balance support: No upper extremity supported;Feet supported Sitting balance-Leahy Scale: Good     Standing balance support: No upper extremity supported;During functional activity Standing balance-Leahy Scale: Fair Standing balance comment: able to perform sink level ADL with no UE support                    Cognition Arousal/Alertness: Awake/alert Behavior During Therapy: WFL for tasks assessed/performed Overall Cognitive Status: Within Functional Limits for tasks assessed                      Exercises Total Joint Exercises Quad Sets: AROM;Right;10 reps Short Arc Quad: AROM;Right;10 reps Heel Slides: AAROM;Right;10 reps Hip ABduction/ADduction: AROM;Right;10 reps Straight Leg Raises: AROM;Right;10 reps Long Arc Quad: AROM;Right;10 reps Goniometric ROM: 5-90    General Comments General comments (skin integrity, edema, etc.): daughter present      Pertinent Vitals/Pain Pain Assessment: 0-10 Pain Score: 5  Pain Location: R knee  Pain Descriptors / Indicators: Sore Pain Intervention(s): Limited activity within patient's tolerance;Monitored during session;Premedicated before session;Repositioned    Home Living Family/patient expects to be discharged to:: Private residence Living Arrangements: Alone Available Help at Discharge: Family;Available 24 hours/day Type of Home: House Home Access: Stairs to enter Entrance Stairs-Rails: Right Home Layout: One level Home Equipment: Grab bars - tub/shower;Hand held shower head      Prior Function Level of Independence: Independent      Comments: likes to travel   PT Goals (current goals can now be found in the care plan section) Acute Rehab  PT Goals Patient Stated Goal: travel PT Goal Formulation: With patient Time For Goal Achievement: 08/16/16 Potential to Achieve  Goals: Good Progress towards PT goals: Progressing toward goals    Frequency    7X/week      PT Plan Current plan remains appropriate    Co-evaluation             End of Session Equipment Utilized During Treatment: Gait belt Activity Tolerance: Patient tolerated treatment well Patient left: with call bell/phone within reach;with family/visitor present;in chair Nurse Communication: Mobility status Pain - Right/Left: Right Pain - part of body: Knee     Time: [id number] PT Time Calculation (min) (ACUTE ONLY): 33 min  Charges:  $Gait Training: 8-22 mins $Therapeutic Exercise: 8-22 mins                    G Codes:       Salina April, PTA Pager: [phone]   08/10/2016, 1:06 PM

## 2016-08-10 NOTE — Evaluation (Signed)
Occupational Therapy Evaluation Patient Details Name: Barbara Hernandez MRN: [id number] DOB: 12/31/1940 Today's Date: 08/10/2016    History of Present Illness Admitted for RTKA, WBAT;  has a past medical history of Anemia; Arthritis; Barrett's esophagus (2012); Bladder tumor; BPPV (benign paroxysmal positional vertigo) (2004); Family history of adverse reaction to anesthesia;  Mild left ventricular hypertrophy   Clinical Impression   PTA Pt independent in ADL and mobility. Pt currently min A for LB ADL and min guard for mobility with RW. Pt will benefit from skilled OT in the acute care setting to maximize safety and independence in ADL and functional transfers prior to dc home with family support. Next session should focus on AE education (Pt plans on purchasing kee/hip kit).    Follow Up Recommendations  No OT follow up;Supervision/Assistance - 24 hour (initially)    Equipment Recommendations  3 in 1 bedside commode    Recommendations for Other Services       Precautions / Restrictions Precautions Precautions: Knee;Fall Precaution Comments: reviewed precautions no pillow under knee Required Braces or Orthoses: Knee Immobilizer - Right Knee Immobilizer - Right: Other (comment) (in room, not used during session) Restrictions Weight Bearing Restrictions: Yes RLE Weight Bearing: Weight bearing as tolerated      Mobility Bed Mobility               General bed mobility comments: Pt OOB in recliner when OT entered room  Transfers Overall transfer level: Needs assistance Equipment used: Rolling walker (2 wheeled) Transfers: Sit to/from Stand Sit to Stand: Min guard         General transfer comment: vc for safe hand placement    Balance Overall balance assessment: Needs assistance Sitting-balance support: No upper extremity supported;Feet supported Sitting balance-Leahy Scale: Good     Standing balance support: No upper extremity supported;During functional  activity Standing balance-Leahy Scale: Fair Standing balance comment: able to perform sink level ADL with no UE support                            ADL Overall ADL's : Needs assistance/impaired Eating/Feeding: Modified independent;Sitting   Grooming: Wash/dry hands;Oral care;Min guard;Standing Grooming Details (indicate cue type and reason): sink level Upper Body Bathing: Set up;Standing   Lower Body Bathing: Moderate assistance;With caregiver independent assisting;Sit to/from stand Lower Body Bathing Details (indicate cue type and reason): Pt's shower is too small for shower chair or stool Upper Body Dressing : Modified independent;Sitting Upper Body Dressing Details (indicate cue type and reason): donned bath robe Lower Body Dressing: Min guard;Sit to/from stand Lower Body Dressing Details (indicate cue type and reason): able to don underwear with no assist from [facility], Pt educated to dress operated leg first Toilet Transfer: Min guard;Ambulation;BSC;RW;With caregiver independent Cabin crew Details (indicate cue type and reason): vc for hand placement Toileting- Clothing Manipulation and Hygiene: Min guard;Sit to/from stand Toileting - Clothing Manipulation Details (indicate cue type and reason): no assist needed for peri care or gown Tub/ Shower Transfer: Walk-in shower;Min guard;With caregiver independent assisting;Ambulation;Rolling walker;Grab bars Tub/Shower Transfer Details (indicate cue type and reason): very small shower, has grab bars inside shower door Functional mobility during ADLs: Min guard;Rolling walker;Cueing for safety (vc to keep RW closer to self) General ADL Comments: Pt will have daughter staying with her initially     Vision Baseline Vision/History: Wears glasses Wears Glasses: At all times Patient Visual Report: No change from baseline Vision Assessment?: No apparent visual  deficits     Perception     Praxis      Pertinent  Vitals/Pain Pain Assessment: 0-10 Pain Score: 5  Pain Location: R knee in bone foam Pain Descriptors / Indicators: Aching Pain Intervention(s): Limited activity within patient's tolerance;Monitored during session;Repositioned;Ice applied     Hand Dominance Left   Extremity/Trunk Assessment Upper Extremity Assessment Upper Extremity Assessment: LUE deficits/detail;Overall WFL for tasks assessed LUE Deficits / Details: baseline shoulder deficits   Lower Extremity Assessment Lower Extremity Assessment: RLE deficits/detail RLE Deficits / Details: Grossly decr AROM and strength; limited by pain postop       Communication Communication Communication: No difficulties   Cognition Arousal/Alertness: Awake/alert Behavior During Therapy: WFL for tasks assessed/performed Overall Cognitive Status: Within Functional Limits for tasks assessed                     General Comments  Daughter in room for session    Exercises       Shoulder Instructions      Home Living Family/patient expects to be discharged to:: Private residence Living Arrangements: Alone Available Help at Discharge: Family;Available 24 hours/day Type of Home: House Home Access: Stairs to enter [employer] of Steps: 4 Entrance Stairs-Rails: Right Home Layout: One level     Bathroom Shower/Tub: Occupational psychologist: Handicapped height Bathroom Accessibility: Yes How Accessible: Accessible via walker Home Equipment: Grab bars - tub/shower;Hand held shower head          Prior Functioning/Environment Level of Independence: Independent        Comments: likes to travel        OT Problem List: Decreased strength;Decreased range of motion;Impaired balance (sitting and/or standing);Decreased knowledge of use of DME or AE;Pain      OT Treatment/Interventions: Self-care/ADL training;Patient/family education;Balance training    OT Goals(Current goals can be found in the care  plan section) Acute Rehab OT Goals Patient Stated Goal: travel OT Goal Formulation: With patient/family Time For Goal Achievement: 08/17/16 Potential to Achieve Goals: Good ADL Goals Pt Will Perform Lower Body Bathing: with modified independence;with adaptive equipment;sit to/from stand Pt Will Perform Lower Body Dressing: with modified independence;with adaptive equipment;sit to/from stand  OT Frequency: Min 2X/week   Barriers to D/C:            Co-evaluation              End of Session Equipment Utilized During Treatment: Gait belt;Rolling walker CPM Right Knee CPM Right Knee: Off Nurse Communication: Mobility status;Weight bearing status  Activity Tolerance: Patient tolerated treatment well Patient left: in chair;with call bell/phone within reach;with family/visitor present  OT Visit Diagnosis: Other abnormalities of gait and mobility (R26.89)                ADL either performed or assessed with clinical judgement  Time: 1005-1048 OT Time Calculation (min): 43 min Charges:  OT General Charges $OT Visit: 1 Procedure OT Evaluation $OT Eval Moderate Complexity: 1 Procedure OT Treatments $Self Care/Home Management : 23-37 mins G-Codes:     Hulda Humphrey OTR/L [place] 08/10/2016, 10:59 AM

## 2016-08-11 LAB — BASIC METABOLIC PANEL
Anion gap: 8 (ref 5–15)
BUN: 11 mg/dL (ref 6–20)
CHLORIDE: 95 mmol/L — AB (ref 101–111)
CO2: 27 mmol/L (ref 22–32)
Calcium: 8.9 mg/dL (ref 8.9–10.3)
Creatinine, Ser: 0.66 mg/dL (ref 0.44–1.00)
GFR calc non Af Amer: >60 mL/min (ref >60)
Glucose, Bld: 124 mg/dL — ABNORMAL HIGH (ref 65–99)
POTASSIUM: 5 mmol/L (ref 3.5–5.1)
SODIUM: 130 mmol/L — AB (ref 135–145)

## 2016-08-11 LAB — CBC
HCT: 32 % — ABNORMAL LOW (ref 36.0–46.0)
HEMOGLOBIN: 10.5 g/dL — AB (ref 12.0–15.0)
MCH: 28.8 pg (ref 26.0–34.0)
MCHC: 32.8 g/dL (ref 30.0–36.0)
MCV: 87.9 fL (ref 78.0–100.0)
Platelets: 321 10*3/uL (ref 150–400)
RBC: 3.64 MIL/uL — ABNORMAL LOW (ref 3.87–5.11)
RDW: 14.2 % (ref 11.5–15.5)
WBC: 13.1 10*3/uL — ABNORMAL HIGH (ref 4.0–10.5)

## 2016-08-11 LAB — GLUCOSE, CAPILLARY: GLUCOSE-CAPILLARY: 107 mg/dL — AB (ref 65–99)

## 2016-08-11 MED ORDER — DOCUSATE SODIUM 100 MG PO TABS: ORAL_TABLET | ORAL | 0 refills | Status: DC

## 2016-08-11 MED ORDER — ONDANSETRON HCL 4 MG PO TABS: 4.0000 mg | ORAL_TABLET | Freq: Four times a day (QID) | ORAL | 0 refills | Status: DC | PRN

## 2016-08-11 MED ORDER — ASPIRIN 325 MG PO TBEC: DELAYED_RELEASE_TABLET | ORAL | 0 refills | Status: DC

## 2016-08-11 NOTE — Progress Notes (Signed)
Physical Therapy Treatment Patient Details Name: Barbara Hernandez MRN: [id number] DOB: Feb 13, 1941 Today's Date: 08/11/2016    History of Present Illness Admitted for RTKA, WBAT;  has a past medical history of Anemia; Arthritis; Barrett's esophagus (2012); Bladder tumor; BPPV (benign paroxysmal positional vertigo) (2004); Family history of adverse reaction to anesthesia;  Mild left ventricular hypertrophy    PT Comments    Patient is making good progress with PT.  From a mobility standpoint anticipate patient will be ready for DC home when medically ready.      Follow Up Recommendations  Home health PT;Supervision/Assistance - 24 hour     Equipment Recommendations  Rolling walker with 5" wheels;3in1 (PT) (short)    Recommendations for Other Services       Precautions / Restrictions Precautions Precautions: Knee;Fall Precaution Booklet Issued: Yes (comment) Restrictions Weight Bearing Restrictions: Yes RLE Weight Bearing: Weight bearing as tolerated    Mobility  Bed Mobility               General bed mobility comments: pt OOB in chair upon arrival  Transfers Overall transfer level: Needs assistance Equipment used: Rolling walker (2 wheeled) Transfers: Sit to/from Stand Sit to Stand: Supervision         General transfer comment: carry over of safe hand placement  Ambulation/Gait Ambulation/Gait assistance: Supervision Ambulation Distance (Feet): 200 Feet Assistive device: Rolling walker (2 wheeled) Gait Pattern/deviations: Step-through pattern;Decreased weight shift to right     General Gait Details: pt with steady gait, step through pattern, and less reliance on RW   Stairs     Stair Management: One rail Right;Step to pattern;Sideways   General stair comments: cues for sequencing and technique; min guard for safety; no knee instability noted  Wheelchair Mobility    Modified Rankin (Stroke Patients Only)       Balance     Sitting  balance-Leahy Scale: Good       Standing balance-Leahy Scale: Fair                      Cognition Arousal/Alertness: Awake/alert Behavior During Therapy: WFL for tasks assessed/performed Overall Cognitive Status: Within Functional Limits for tasks assessed                      Exercises Total Joint Exercises Quad Sets: AROM;Right;10 reps Short Arc Quad: AROM;Right;10 reps Heel Slides: AROM;Right;10 reps Hip ABduction/ADduction: AROM;Right;10 reps Straight Leg Raises: AROM;Right;10 reps Long Arc Quad: AROM;Right;10 reps Knee Flexion: AROM;Right;5 reps;Seated;Other (comment) (10 sec holds) Goniometric ROM: 0-95    General Comments        Pertinent Vitals/Pain Pain Assessment: Faces Faces Pain Scale: Hurts a little bit Pain Location: R knee  Pain Descriptors / Indicators: Sore Pain Intervention(s): Monitored during session;Premedicated before session    Home Living                      Prior Function            PT Goals (current goals can now be found in the care plan section) Acute Rehab PT Goals Patient Stated Goal: travel PT Goal Formulation: With patient Time For Goal Achievement: 08/16/16 Potential to Achieve Goals: Good Progress towards PT goals: Progressing toward goals    Frequency    7X/week      PT Plan Current plan remains appropriate    Co-evaluation             End of Session  Equipment Utilized During Treatment: Gait belt Activity Tolerance: Patient tolerated treatment well Patient left: with call bell/phone within reach;with family/visitor present;in chair Nurse Communication: Mobility status Pain - Right/Left: Right Pain - part of body: Knee     Time: 0920-0949 PT Time Calculation (min) (ACUTE ONLY): 29 min  Charges:  $Gait Training: 8-22 mins $Therapeutic Exercise: 8-22 mins                    G Codes:       Sal[place] April, PTA Pager: [phone]   08/11/2016, 10:50  AM

## 2016-08-11 NOTE — Progress Notes (Signed)
Pt ready for d/c home today per MD. Pt met all PT/OT goals, equipment has been delivered to hospital room. Discharge instructions and prescriptions reviewed with pt and her daughter, all questions answered. Belongings packed and sent with pt.   [place], Jerry Caras

## 2016-08-11 NOTE — Progress Notes (Signed)
Occupational Therapy Treatment Patient Details Name: Barbara Hernandez MRN: [id number] DOB: 02-22-1941 Today's Date: 08/11/2016    History of present illness Admitted for RTKA, WBAT;  has a past medical history of Anemia; Arthritis; Barrett's esophagus (2012); Bladder tumor; BPPV (benign paroxysmal positional vertigo) (2004); Family history of adverse reaction to anesthesia;  Mild left ventricular hypertrophy   OT comments  Pt making good progress with functional goals, will d/c home today with assist from her daughter prn  Follow Up Recommendations  No OT follow up;Supervision/Assistance - 24 hour    Equipment Recommendations  3 in 1 bedside commode    Recommendations for Other Services      Precautions / Restrictions Precautions Precautions: Knee;Fall Precaution Booklet Issued: Yes (comment) Required Braces or Orthoses: Knee Immobilizer - Right Restrictions Weight Bearing Restrictions: Yes RLE Weight Bearing: Weight bearing as tolerated       Mobility Bed Mobility               General bed mobility comments: pt OOB in chair upon arrival  Transfers Overall transfer level: Needs assistance Equipment used: Rolling walker (2 wheeled) Transfers: Sit to/from Stand Sit to Stand: Supervision         General transfer comment: carry over of safe hand placement    Balance     Sitting balance-Leahy Scale: Good     Standing balance support: During functional activity Standing balance-Leahy Scale: Fair                     ADL Overall ADL's : Needs assistance/impaired             Lower Body Bathing: With caregiver independent assisting;Sit to/from stand;Minimal assistance       Lower Body Dressing: Min guard;Sit to/from Archivist: Ambulation;RW;With caregiver independent assisting;Supervision/safety;Comfort height toilet   Toileting- Clothing Manipulation and Hygiene: Sit to/from stand;Supervision/safety         General ADL  Comments: educated on A/E for LB ADLs                                      Cognition   Behavior During Therapy: WFL for tasks assessed/performed Overall Cognitive Status: Within Functional Limits for tasks assessed                                    General Comments  pt very pleasant and cooperative    Pertinent Vitals/ Pain       Pain Assessment: 0-10 Pain Score: 2  Faces Pain Scale: Hurts a little bit Pain Location: R knee  Pain Descriptors / Indicators: Sore Pain Intervention(s): Premedicated before session;Monitored during session                                                          Frequency  Min 2X/week        Progress Toward Goals  OT Goals(current goals can now be found in the care plan section)  Progress towards OT goals: Progressing toward goals  Acute Rehab OT Goals Patient Stated Goal: travel OT Goal Formulation: With patient/family  Plan Discharge plan remains appropriate  End of Session Equipment Utilized During Treatment: Rolling walker  OT Visit Diagnosis: Other abnormalities of gait and mobility (R26.89)   Activity Tolerance Patient tolerated treatment well   Patient Left in chair;with call bell/phone within reach;with family/visitor present         Functional Assessment Tool Used: AM-PAC 6 Clicks Daily Activity   Time: 1041-1057 OT Time Calculation (min): 16 min  Charges: OT G-codes **NOT FOR INPATIENT CLASS** Functional Assessment Tool Used: AM-PAC 6 Clicks Daily Activity OT General Charges $OT Visit: 1 Procedure OT Treatments $Therapeutic Activity: 8-22 mins     Britt Bottom 08/11/2016, 12:18 PM

## 2016-08-11 NOTE — Discharge Summary (Signed)
Patient ID: Barbara Hernandez MRN: [id number] DOB/AGE: 21-Dec-1940 76 y.o.  Admit date: 08/09/2016 Discharge date: 08/11/2016  Admission Diagnoses:  Principal Problem:   Primary localized osteoarthrosis of the knee, right Active Problems:   Esophageal reflux   Hypothyroidism   Barrett's esophagus   Hypercholesteremia   Hypertension   Vitamin D deficiency   Sleep apnea   Carotid artery narrowing   MI (mitral incompetence)   Benign essential HTN   Primary localized osteoarthritis of right knee   Discharge Diagnoses:  Same  Past Medical History:  Diagnosis Date  . Anemia   . Arthritis   . Barrett's esophagus 2012   2010 upper endoscopy showed only reflux. 2012 biopsies suggested Barrett's epithelial changes.  . Bladder tumor   . BPPV (benign paroxysmal positional vertigo) 2004  . Family history of adverse reaction to anesthesia    sister had memory problems after delivery of child  . GERD (gastroesophageal reflux disease)   . H/O measles   . Heart murmur   . Hematuria   . Hematuria   . Hemorrhoid   . Hemorrhoids   . History of chicken pox   . Hyperlipidemia   . Hypertension   . Hypothyroidism   . Joint pain   . Mild left ventricular hypertrophy   . Onychomycosis   . Reflux   . Reflux esophagitis   . Sleep apnea 2011   Uses C-Pap machine  . Thyroid disease     Surgeries: Procedure(s): TOTAL KNEE ARTHROPLASTY on 08/09/2016   Consultants:   Discharged Condition: Improved  Hospital Course: TYLICIA DUCHI is an 76 y.o. female who was admitted 08/09/2016 for operative treatment ofPrimary localized osteoarthrosis of the knee, right. Patient has severe unremitting pain that affects sleep, daily activities, and work/hobbies. After pre-op clearance the patient was taken to the operating room on 08/09/2016 and underwent  Procedure(s): TOTAL KNEE ARTHROPLASTY.    Patient was given perioperative antibiotics: Anti-infectives    Start     Dose/Rate Route Frequency Ordered  Stop   08/09/16 1300  ceFAZolin (ANCEF) IVPB 2g/100 mL premix     2 g 200 mL/hr over 30 Minutes Intravenous Every 6 hours 08/09/16 1213 08/09/16 1952   08/09/16 0549  ceFAZolin (ANCEF) IVPB 2g/100 mL premix     2 g 200 mL/hr over 30 Minutes Intravenous On call to O.R. 08/09/16 0549 08/09/16 0720       Patient was given sequential compression devices, early ambulation, and chemoprophylaxis to prevent DVT.  Patient benefited maximally from hospital stay and there were no complications.    Recent vital signs: Patient Vitals for the past 24 hrs:  BP Temp Temp src Pulse Resp SpO2  08/11/16 0429 (!) 122/59 97.6 F (36.4 C) Oral 75 16 96 %  08/10/16 2219 - - - 81 16 96 %  08/10/16 2038 (!) 154/76 98.4 F (36.9 C) Oral 78 16 96 %  08/10/16 1300 136/67 97.4 F (36.3 C) Oral 70 16 98 %     Recent laboratory studies:  Recent Labs  08/10/16 0550 08/11/16 0439  WBC 13.5* 13.1*  HGB 11.4* 10.5*  HCT 35.8* 32.0*  PLT 317 321  NA 132* 130*  K 4.2 5.0  CL 99* 95*  CO2 22 27  BUN 9 11  CREATININE 0.87 0.66  GLUCOSE 180* 124*  CALCIUM 9.0 8.9     Discharge Medications:   Allergies as of 08/11/2016      Reactions   No Known Allergies  Medication List    STOP taking these medications   aspirin 81 MG tablet Replaced by:  aspirin 325 MG EC tablet   glucosamine-chondroitin 500-400 MG tablet   Turmeric 450 MG Caps   vitamin B-12 1000 MCG tablet Commonly known as:  CYANOCOBALAMIN     TAKE these medications   acetaminophen 500 MG tablet Commonly known as:  TYLENOL Take 500 mg by mouth every 6 (six) hours as needed for mild pain.   aspirin 325 MG EC tablet 1 tab a day for the next 30 days to prevent blood clots Replaces:  aspirin 81 MG tablet   CALCIUM 1200 PO Take 1 tablet by mouth daily.   carboxymethylcellulose 0.5 % Soln Commonly known as:  REFRESH PLUS Place 1 drop into both eyes daily as needed.   celecoxib 200 MG capsule Commonly known as:   CELEBREX Take 1 capsule (200 mg total) by mouth daily. am   Docusate Sodium 100 MG capsule 1 tab 2 times a day while on narcotics.  STOOL SOFTENER What changed:  how much to take  how to take this  when to take this  additional instructions   GENTEAL TEARS OP Apply 1 application to eye at bedtime.   hydrochlorothiazide 12.5 MG tablet Commonly known as:  HYDRODIURIL Take 1 tablet (12.5 mg total) by mouth daily. am   levothyroxine 125 MCG tablet Commonly known as:  SYNTHROID, LEVOTHROID Take 1 tablet (125 mcg total) by mouth daily.   lisinopril 40 MG tablet Commonly known as:  PRINIVIL,ZESTRIL Take 1 tablet (40 mg total) by mouth daily. bedtime   omeprazole 20 MG capsule Commonly known as:  PRILOSEC Take 1 capsule (20 mg total) by mouth daily.   ondansetron 4 MG tablet Commonly known as:  ZOFRAN Take 1 tablet (4 mg total) by mouth every 6 (six) hours as needed for nausea.   oxyCODONE 5 MG immediate release tablet Commonly known as:  Oxy IR/ROXICODONE 1-2 tablets every 4-6 hrs as needed for pain   polyethylene glycol packet Commonly known as:  MIRALAX / GLYCOLAX 17grams in 6 oz of water twice a day until bowel movement.  LAXITIVE.  Restart if two days since last bowel movement   simvastatin 20 MG tablet Commonly known as:  ZOCOR Take 1 tablet (20 mg total) by mouth daily. bedtime What changed:  when to take this  additional instructions   Vitamin D3 2000 units Tabs Take 1 tablet by mouth every evening. bedtime       Diagnostic Studies: No results found.  Disposition: 01-Home or Self Care  Discharge Instructions    CPM    Complete by:  As directed    Continuous passive motion machine (CPM):      Use the CPM from 0 to 90 for 6 hours per day.       You may break it up into 2 or 3 sessions per day.      Use CPM for 2 weeks or until you are told to stop.   Call MD / Call 911    Complete by:  As directed    If you experience chest pain or shortness of  breath, CALL 911 and be transported to the hospital emergency room.  If you develope a fever above 101 F, pus (white drainage) or increased drainage or redness at the wound, or calf pain, call your surgeon's office.   Change dressing    Complete by:  As directed    DO NOT REMOVE BANDAGE  OVER SURGICAL INCISION.  [place] WHOLE LEG INCLUDING OVER THE WATERPROOF BANDAGE WITH SOAP AND WATER EVERY DAY.   Constipation Prevention    Complete by:  As directed    Drink plenty of fluids.  Prune juice may be helpful.  You may use a stool softener, such as Colace (over the counter) 100 mg twice a day.  Use MiraLax (over the counter) for constipation as needed.   Diet - low sodium heart healthy    Complete by:  As directed    Discharge instructions    Complete by:  As directed    INSTRUCTIONS AFTER JOINT REPLACEMENT   Remove items at home which could result in a fall. This includes throw rugs or furniture in walking pathways ICE to the affected joint every three hours while awake for 30 minutes at a time, for at least the first 3-5 days, and then as needed for pain and swelling.  Continue to use ice for pain and swelling. You may notice swelling that will progress down to the foot and ankle.  This is normal after surgery.  Elevate your leg when you are not up walking on it.   Continue to use the breathing machine you got in the hospital (incentive spirometer) which will help keep your temperature down.  It is common for your temperature to cycle up and down following surgery, especially at night when you are not up moving around and exerting yourself.  The breathing machine keeps your lungs expanded and your temperature down.   DIET:  As you were doing prior to hospitalization, we recommend a well-balanced diet.  DRESSING / WOUND CARE / SHOWERING  Keep the surgical dressing until follow up.  The dressing is water proof, so you can shower without any extra covering.  IF THE DRESSING FALLS OFF or the wound gets  wet inside, change the dressing with sterile gauze.  Please use good hand washing techniques before changing the dressing.  Do not use any lotions or creams on the incision until instructed by your surgeon.    ACTIVITY  Increase activity slowly as tolerated, but follow the weight bearing instructions below.   No driving for 6 weeks or until further direction given by your physician.  You cannot drive while taking narcotics.  No lifting or carrying greater than 10 lbs. until further directed by your surgeon. Avoid periods of inactivity such as sitting longer than an hour when not asleep. This helps prevent blood clots.  You may return to work once you are authorized by your doctor.     WEIGHT BEARING   Weight bearing as tolerated with assist device (walker, cane, etc) as directed, use it as long as suggested by your surgeon or therapist, typically at least 2-3 weeks.   EXERCISES  Results after joint replacement surgery are often greatly improved when you follow the exercise, range of motion and muscle strengthening exercises prescribed by your doctor. Safety measures are also important to protect the joint from further injury. Any time any of these exercises cause you to have increased pain or swelling, decrease what you are doing until you are comfortable again and then slowly increase them. If you have problems or questions, call your caregiver or physical therapist for advice.   Rehabilitation is important following a joint replacement. After just a few days of immobilization, the muscles of the leg can become weakened and shrink (atrophy).  These exercises are designed to build up the tone and strength of the thigh and  leg muscles and to improve motion. Often times heat used for twenty to thirty minutes before working out will loosen up your tissues and help with improving the range of motion but do not use heat for the first two weeks following surgery (sometimes heat can increase  post-operative swelling).   These exercises can be done on a training (exercise) mat, on the floor, on a table or on a bed. Use whatever works the best and is most comfortable for you.    Use music or television while you are exercising so that the exercises are a pleasant break in your day. This will make your life better with the exercises acting as a break in your routine that you can look forward to.   Perform all exercises about fifteen times, three times per day or as directed.  You should exercise both the operative leg and the other leg as well.   Exercises include:  Quad Sets - Tighten up the muscle on the front of the thigh (Quad) and hold for 5-10 seconds.   Straight Leg Raises - With your knee straight (if you were given a brace, keep it on), lift the leg to 60 degrees, hold for 3 seconds, and slowly lower the leg.  Perform this exercise against resistance later as your leg gets stronger.  Leg Slides: Lying on your back, slowly slide your foot toward your buttocks, bending your knee up off the floor (only go as far as is comfortable). Then slowly slide your foot back down until your leg is flat on the floor again.  Angel Wings: Lying on your back spread your legs to the side as far apart as you can without causing discomfort.  Hamstring Strength:  Lying on your back, push your heel against the floor with your leg straight by tightening up the muscles of your buttocks.  Repeat, but this time bend your knee to a comfortable angle, and push your heel against the floor.  You may put a pillow under the heel to make it more comfortable if necessary.   A rehabilitation program following joint replacement surgery can speed recovery and prevent re-injury in the future due to weakened muscles. Contact your doctor or a physical therapist for more information on knee rehabilitation.    CONSTIPATION  Constipation is defined medically as fewer than three stools per week and severe constipation as less  than one stool per week.  Even if you have a regular bowel pattern at home, your normal regimen is likely to be disrupted due to multiple reasons following surgery.  Combination of anesthesia, postoperative narcotics, change in appetite and fluid intake all can affect your bowels.   YOU MUST use at least one of the following options; they are listed in order of increasing strength to get the job done.  They are all available over the counter, and you may need to use some, POSSIBLY even all of these options:    Drink plenty of fluids (prune juice may be helpful) and high fiber foods Colace 100 mg by mouth twice a day  Senokot for constipation as directed and as needed Dulcolax (bisacodyl), take with full glass of water  Miralax (polyethylene glycol) once or twice a day as needed.  If you have tried all these things and are unable to have a bowel movement in the first 3-4 days after surgery call either your surgeon or your primary doctor.    If you experience loose stools or diarrhea, hold the medications until  you stool forms back up.  If your symptoms do not get better within 1 week or if they get worse, check with your doctor.  If you experience "the worst abdominal pain ever" or develop nausea or vomiting, please contact the office immediately for further recommendations for treatment.   ITCHING:  If you experience itching with your medications, try taking only a single pain pill, or even half a pain pill at a time.  You can also use Benadryl over the counter for itching or also to help with sleep.   TED HOSE STOCKINGS:  Use stockings on both legs until for at least 2 weeks or as directed by physician office. They may be removed at night for sleeping.  MEDICATIONS:  See your medication summary on the "After Visit Summary" that nursing will review with you.  You may have some home medications which will be placed on hold until you complete the course of blood thinner medication.  It is important  for you to complete the blood thinner medication as prescribed.  PRECAUTIONS:  If you experience chest pain or shortness of breath - call 911 immediately for transfer to the hospital emergency department.   If you develop a fever greater that 101 F, purulent drainage from wound, increased redness or drainage from wound, foul odor from the wound/dressing, or calf pain - CONTACT YOUR SURGEON.                                                   FOLLOW-UP APPOINTMENTS:  If you do not already have a post-op appointment, please call the office for an appointment to be seen by your surgeon.  Guidelines for how soon to be seen are listed in your "After Visit Summary", but are typically between 1-4 weeks after surgery.  OTHER INSTRUCTIONS:   Knee Replacement:  Do not place pillow under knee, focus on keeping the knee straight while resting. CPM instructions: 0-90 degrees, 2 hours in the morning, 2 hours in the afternoon, and 2 hours in the evening. Place foam block, curve side up under heel at all times except when in CPM or when walking.  DO NOT modify, tear, cut, or change the foam block in any way.  MAKE SURE YOU:  Understand these instructions.  Get help right away if you are not doing well or get worse.    Thank you for letting us be a part of your medical care team.  It is a privilege we respect greatly.  We hope these instructions will help you stay on track for a fast and full recovery!   Do not put a pillow under the knee. Place it under the heel.    Complete by:  As directed    Place gray foam block, curve side up under heel at all times except when in CPM or when walking.  DO NOT modify, tear, cut, or change in any way the gray foam block.   Increase activity slowly as tolerated    Complete by:  As directed    Patient may shower    Complete by:  As directed    Aquacel dressing is water proof    Wash over it and the whole leg with soap and water at the end of your shower   TED hose     Complete by:  As directed  Use stockings (TED hose) for 2 weeks on both leg(s).  You may remove them at night for sleeping.      Follow-up Information    [facility] Follow up.   Specialty:  [place] Why:  Someone from [facility] will contact you to arrange start date and time for therapy. Contact information: [address] [place] [address] [phone]        Lorn Junes, MD Follow up on 08/23/2016.   Specialty:  Orthopedic Surgery Why:  appointment time in [place] office is 3:30 pm   First post op visit must be in the [facility] information: [place] [address] [facility] Follow up on 08/24/2016.   Specialty:  Orthopedic Surgery Why:  Physical therapy appt with Ulice Dash at 9:30 in our [place] [facility] office in [place] [id number]   the phone number there is [phone] Contact information: [place] [address] [phone]            Signed: Linda Hedges 08/11/2016, 7:41 AM

## 2016-11-20 ENCOUNTER — Encounter (HOSPITAL_COMMUNITY): Payer: Self-pay | Admitting: Orthopedic Surgery

## 2016-11-20 NOTE — Addendum Note (Signed)
Addendum  created 11/20/16 0946 by Lenae Wherley, MD   Sign clinical note    

## 2017-03-30 ENCOUNTER — Ambulatory Visit
Admission: RE | Admit: 2017-03-30 | Discharge: 2017-03-30 | Disposition: A | Discharge status: Home / Self Care | Payer: Medicare Other | Source: Ambulatory Visit | Attending: Orthopedic Surgery | Admitting: Orthopedic Surgery

## 2017-03-30 ENCOUNTER — Other Ambulatory Visit: Payer: Self-pay | Admitting: Orthopedic Surgery

## 2017-03-30 ENCOUNTER — Ambulatory Visit: Payer: Medicare Other

## 2017-03-30 DIAGNOSIS — M25562 Pain in left knee: Secondary | ICD-10-CM | POA: Diagnosis present

## 2017-03-30 DIAGNOSIS — Z96651 Presence of right artificial knee joint: Secondary | ICD-10-CM | POA: Diagnosis not present

## 2017-03-30 DIAGNOSIS — M25561 Pain in right knee: Secondary | ICD-10-CM | POA: Diagnosis present

## 2017-03-30 DIAGNOSIS — R52 Pain, unspecified: Secondary | ICD-10-CM

## 2017-03-30 DIAGNOSIS — M1712 Unilateral primary osteoarthritis, left knee: Secondary | ICD-10-CM | POA: Insufficient documentation

## 2017-03-30 DIAGNOSIS — M7989 Other specified soft tissue disorders: Secondary | ICD-10-CM | POA: Insufficient documentation

## 2017-04-26 ENCOUNTER — Inpatient Hospital Stay: Admission: AD | Admit: 2017-04-26 | Payer: Medicare Other | Source: Ambulatory Visit | Admitting: Orthopedic Surgery

## 2017-05-04 ENCOUNTER — Other Ambulatory Visit: Payer: Medicare Other | Admitting: Urology

## 2017-05-27 ENCOUNTER — Other Ambulatory Visit: Payer: Self-pay | Admitting: Family Medicine

## 2017-05-27 DIAGNOSIS — Z1231 Encounter for screening mammogram for malignant neoplasm of breast: Secondary | ICD-10-CM

## 2017-06-09 ENCOUNTER — Ambulatory Visit
Admission: RE | Admit: 2017-06-09 | Discharge: 2017-06-09 | Disposition: A | Discharge status: Home / Self Care | Payer: Medicare Other | Source: Ambulatory Visit | Attending: Family Medicine | Admitting: Family Medicine

## 2017-06-09 DIAGNOSIS — Z1231 Encounter for screening mammogram for malignant neoplasm of breast: Secondary | ICD-10-CM | POA: Diagnosis not present

## 2017-07-06 ENCOUNTER — Ambulatory Visit: Payer: Medicare Other | Admitting: Urology

## 2017-07-06 ENCOUNTER — Encounter: Payer: Self-pay | Admitting: Urology

## 2017-07-06 VITALS — BP 145/74 | HR 60 | Ht 62.0 in | Wt 218.0 lb

## 2017-07-06 DIAGNOSIS — Z8551 Personal history of malignant neoplasm of bladder: Secondary | ICD-10-CM

## 2017-07-06 DIAGNOSIS — R3915 Urgency of urination: Secondary | ICD-10-CM

## 2017-07-06 LAB — MICROSCOPIC EXAMINATION: RBC MICROSCOPIC, UA: NONE SEEN /HPF (ref 0–?)

## 2017-07-06 LAB — URINALYSIS, COMPLETE
BILIRUBIN UA: NEGATIVE
GLUCOSE, UA: NEGATIVE
Ketones, UA: NEGATIVE
LEUKOCYTES UA: NEGATIVE
Nitrite, UA: NEGATIVE
PROTEIN UA: NEGATIVE
RBC, UA: NEGATIVE
Specific Gravity, UA: 1.01 (ref 1.005–1.030)
UUROB: 0.2 mg/dL (ref 0.2–1.0)
pH, UA: 7 (ref 5.0–7.5)

## 2017-07-06 MED ORDER — CIPROFLOXACIN HCL 500 MG PO TABS
500.0000 mg | ORAL_TABLET | Freq: Once | ORAL | Status: AC
  Administered 2017-07-06: 500 mg via ORAL

## 2017-07-06 MED ORDER — LIDOCAINE HCL 2 % EX GEL
1.0000 "application " | Freq: Once | CUTANEOUS | Status: AC
  Administered 2017-07-06: 1 via URETHRAL

## 2017-07-06 NOTE — Progress Notes (Signed)
07/06/2017 5:16 PM   Barbara Hernandez June 01, 1941 [id number]  Referring provider: Gearldine Hernandez, Barbara Hernandez Barbara Hernandez, Barbara Hernandez [id number]  Chief Complaint  Patient presents with  . Cysto    HPI: 76-female who initially presented with gross hematuria who underwent hematuria workup including CT urogram on 4/ 2016 as well as cystoscopy revealing a small 1 cm tumor just beyond the left UO. She underwent TURBT in 10/2013 with pathology consistent with PUNLMP.  She was a former smoker, smoked 15 years, 1 pack per day and quit in her mid 53s.  She does have some daytime urinary urgency and persistent nocturia.  She does avoid irritating beverages except for one cup of coffee in the morning.  Overall, she reports that she is not bothered enough by this to want to try any medications.  10/15/14 CT Urogram IMPRESSION: No renal, ureteral, or bladder calculi. No hydronephrosis. No enhancing renal lesions. No filling defects in the bilateral opacified proximal collecting systems, ureters, or bladder.  She returns today to the office for routine surveillance cystoscopy.    PMH: Past Medical History:  Diagnosis Date  . Anemia   . Arthritis   . Barrett's esophagus 2012   2010 upper endoscopy showed only reflux. 2012 biopsies suggested Barrett's epithelial changes.  . Bladder tumor   . BPPV (benign paroxysmal positional vertigo) 2004  . Family history of adverse reaction to anesthesia    sister had memory problems after delivery of child  . GERD (gastroesophageal reflux disease)   . H/O measles   . Heart murmur   . Hematuria   . Hematuria   . Hemorrhoid   . Hemorrhoids   . History of chicken pox   . Hyperlipidemia   . Hypertension   . Hypothyroidism   . Joint pain   . Mild left ventricular hypertrophy   . Onychomycosis   . Reflux   . Reflux esophagitis   . Sleep apnea 2011   Uses C-Pap machine  . Thyroid disease     Surgical History: Past Surgical History:    Procedure Laterality Date  . BLEPHAROPLASTY Bilateral 2015  . BREAST EXCISIONAL BIOPSY Left 1988   benign  . COLONOSCOPY W/ BIOPSIES N/A 01/16/2013   No source for GI blood loss, mild lymphatic prominence in the rectum.  . ESOPHAGOGASTRODUODENOSCOPY (EGD) WITH PROPOFOL N/A 03/15/2016   Procedure: ESOPHAGOGASTRODUODENOSCOPY (EGD) WITH PROPOFOL;  Surgeon: Robert Bellow, MD;  Location: [facility];  Service: Endoscopy;  Laterality: N/A;  . EYE SURGERY Bilateral 2006   cataract  . Left wrist fracture    . POPLITEAL SYNOVIAL CYST EXCISION Right   . TOTAL KNEE ARTHROPLASTY Right 08/09/2016   Procedure: TOTAL KNEE ARTHROPLASTY;  Surgeon: Elsie Saas, MD;  Location: [facility];  Service: Orthopedics;  Laterality: Right;  . TRANSURETHRAL RESECTION OF BLADDER TUMOR N/A 11/20/2014   Procedure: TRANSURETHRAL RESECTION OF BLADDER TUMOR (TURBT);  Surgeon: Hollice Espy, MD;  Location: [facility];  Service: Urology;  Laterality: N/A;  . UPPER GI ENDOSCOPY  2012, 2014    Home Medications:  Allergies as of 07/06/2017      Reactions   No Known Allergies       Medication List        Accurate as of 07/06/17  5:16 PM. Always use your most recent med list.          acetaminophen 500 MG tablet Commonly known as:  TYLENOL Take 500 mg by mouth every 6 (six) hours as needed  for mild pain.   aspirin 325 MG EC tablet 1 tab a day for the next 30 days to prevent blood clots   CALCIUM 1200 PO Take 1 tablet by mouth daily.   carboxymethylcellulose 0.5 % Soln Commonly known as:  REFRESH PLUS Place 1 drop into both eyes daily as needed.   celecoxib 200 MG capsule Commonly known as:  CELEBREX Take 1 capsule (200 mg total) by mouth daily. am   Docusate Sodium 100 MG capsule 1 tab 2 times a day while on narcotics.  STOOL SOFTENER   GENTEAL TEARS OP Apply 1 application to eye at bedtime.   levothyroxine 125 MCG tablet Commonly known as:  SYNTHROID, LEVOTHROID Take 1 tablet (125 mcg total) by  mouth daily.   lisinopril 40 MG tablet Commonly known as:  PRINIVIL,ZESTRIL Take 1 tablet (40 mg total) by mouth daily. bedtime   omeprazole 20 MG capsule Commonly known as:  PRILOSEC Take 1 capsule (20 mg total) by mouth daily.   ondansetron 4 MG tablet Commonly known as:  ZOFRAN Take 1 tablet (4 mg total) by mouth every 6 (six) hours as needed for nausea.   simvastatin 20 MG tablet Commonly known as:  ZOCOR Take 1 tablet (20 mg total) by mouth daily. bedtime   vitamin B-12 100 MCG tablet Commonly known as:  CYANOCOBALAMIN Take 100 mcg by mouth daily.   Vitamin D3 2000 units Tabs Take 1 tablet by mouth every evening. bedtime       Allergies:  Allergies  Allergen Reactions  . No Known Allergies     Family History: Family History  Problem Relation Age of Onset  . Heart disease Mother        CHF, CAD  . Hypertension Mother   . Mental illness Mother        Dementia  . Cancer Mother        breast, Brain Cancer  . Breast cancer Mother 17  . Cancer Father        lung  . Heart disease Father        MI  . Heart disease Sister   . Cancer Sister        Breast  . Barrett's esophagus Sister   . Hyperlipidemia Sister   . Breast cancer Sister 6  . Asthma Brother   . Heart disease Brother   . Diabetes Brother   . Anxiety disorder Brother   . Neuropathy Brother   . Neuropathy Sister   . Anxiety disorder Sister   . Mental illness Sister        Dementia  . Fibromyalgia Sister     Social History:  reports that she quit smoking about 40 years ago. Her smoking use included cigarettes. She has a 10.00 pack-year smoking history. she has never used smokeless tobacco. She reports that she drinks about 0.6 - 1.8 oz of alcohol per week. She reports that she does not use drugs.   Physical Exam: BP (!) 145/74   Pulse 60   Ht 5\' 2"  (1.575 m)   Wt 218 lb (98.9 kg)   BMI 39.87 kg/m   Constitutional:  Alert and oriented, No acute distress. HEENT: [place] AT, moist mucus  membranes.  Trachea midline, no masses. Cardiovascular: No clubbing, cyanosis, or edema. Respiratory: Normal respiratory effort, no increased work of breathing. GI: Abdomen is soft, nontender, nondistended, no abdominal masses GU: No CVA tenderness. Normal external genitalia, normal urethral meatus. Skin: No rashes, bruises or suspicious lesions. Neurologic: Grossly  intact, no focal deficits, moving all 4 extremities. Psychiatric: Normal mood and affect.  Laboratory Data: Lab Results  Component Value Date   WBC 13.1 (H) 08/11/2016   HGB 10.5 (L) 08/11/2016   HCT 32.0 (L) 08/11/2016   MCV 87.9 08/11/2016   PLT 321 08/11/2016    Lab Results  Component Value Date   CREATININE 0.66 08/11/2016   Urinalysis Results for orders placed or performed in visit on 07/06/17  Microscopic Examination  Result Value Ref Range   WBC, UA 0-5 0 - 5 /hpf   RBC, UA None seen 0 - 2 /hpf   Epithelial Cells (non renal) 0-10 0 - 10 /hpf   Bacteria, UA Few (A) None seen/Few  Urinalysis, Complete  Result Value Ref Range   Specific Gravity, UA 1.010 1.005 - 1.030   pH, UA 7.0 5.0 - 7.5   Color, UA Yellow Yellow   Appearance Ur Clear Clear   Leukocytes, UA Negative Negative   Protein, UA Negative Negative/Trace   Glucose, UA Negative Negative   Ketones, UA Negative Negative   RBC, UA Negative Negative   Bilirubin, UA Negative Negative   Urobilinogen, Ur 0.2 0.2 - 1.0 mg/dL   Nitrite, UA Negative Negative   Microscopic Examination See below:      Pertinent Imaging: CT Urogram as above   Cystoscopy Procedure Note  Patient identification was confirmed, informed consent was obtained, and patient was prepped using Betadine solution.  Lidocaine jelly was administered per urethral meatus.    Preoperative abx where received prior to procedure.    Procedure: - Flexible cystoscope introduced, without any difficulty.   - Thorough search of the bladder revealed:    normal urethral meatus     normal urothelium, hypopigmented urothelial scar just beyond left UO at the site of previous resection    no stones    no ulcers     no tumors    no urethral polyps    no trabeculation  - Ureteral orifices were normal in position and appearance.  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  Given that she has no personal history of malignancy, we discussed increasing the interval of surveillance cystoscopy to every 2 years which seems reasonable - Urinalysis, Complete - ciprofloxacin (CIPRO) tablet 500 mg; Take 1 tablet (500 mg total) by mouth once. - lidocaine (XYLOCAINE) 2 % jelly 1 application; Place 1 application into the urethra once.  2. Urinary urgency Reviewed behavioral modification Offered trial of anticholinergic versus Myrbetriq but declined, minimal bother She will return sooner if she changes her mind  Return in about 2 years (around 07/07/2019) for cysto.  Hollice Espy, MD  [facility] [address] [place], [place] [address] [phone]

## 2017-07-18 DIAGNOSIS — R0602 Shortness of breath: Secondary | ICD-10-CM | POA: Insufficient documentation

## 2017-09-05 NOTE — Pre-Procedure Instructions (Signed)
Barbara Hernandez  09/05/2017      [place], [place] [facility] [place] [address] [place] [address] Phone: [phone] Fax: [phone]  [facility], [place] [address] Phone: [phone] Fax: [phone]  [facility] [place], [place] [address] [place] [address] Phone: [phone] Fax: [phone]    Your procedure is scheduled on Mon., September 19, 2017  Report to [facility] Admitting Entrance "A" at 5:30AM   Call this number if you have problems the morning of surgery:  [phone]   Remember:  Do not eat food or drink liquids after midnight.  Take these medicines the morning of surgery with A SIP OF WATER: Levothyroxine (SYNTHROID, LEVOTHROID), Omeprazole (PRILOSEC), and Carboxymethylcellulose (REFRESH PLUS). If needed Ondansetron [facility]) for nausea and acetaminophen (TYLENOL) for pain.  Follow your doctor's instruction regarding Aspirin.  7 days before surgery (Mar. 25), stop taking all Aspirins, Vitamins, Fish oils, and Herbal medications. Also stop all NSAIDS i.e. Advil, Ibuprofen, Motrin, Aleve, Anaprox, Naproxen, BC and Goody Powders.   Do not wear jewelry, make-up or nail polish.  Do not wear lotions, powders, perfumes, or deodorant.  Do not shave 48 hours prior to surgery.   Do not bring valuables to the hospital.  [facility] is not responsible for any belongings or valuables.  Contacts, dentures or bridgework may not be worn into surgery.  Leave your suitcase in the car.  After surgery it may be brought to your room.  For patients admitted to the hospital, discharge time will be determined by your treatment team.  Patients discharged the day of surgery will not be allowed to drive home.   Special instructions:   [facility]- Preparing For  Surgery  Before surgery, you can play an important role. Because skin is not sterile, your skin needs to be as free of germs as possible. You can reduce the number of germs on your skin by washing with CHG (chlorahexidine gluconate) Soap before surgery.  CHG is an antiseptic cleaner which kills germs and bonds with the skin to continue killing germs even after washing.  Please do not use if you have an allergy to CHG or antibacterial soaps. If your skin becomes reddened/irritated stop using the CHG.  Do not shave (including legs and underarms) for at least 48 hours prior to first CHG shower. It is OK to shave your face.  Please follow these instructions carefully.   1. Shower the NIGHT BEFORE SURGERY and the MORNING OF SURGERY with CHG.   2. If you chose to wash your hair, wash your hair first as usual with your normal shampoo.  3. After you shampoo, rinse your hair and body thoroughly to remove the shampoo.  4. Use CHG as you would any other liquid soap. You can apply CHG directly to the skin and wash gently with a scrungie or a clean washcloth.   5. Apply the CHG Soap to your body ONLY FROM THE NECK DOWN.  Do not use on open wounds or open sores. Avoid contact with your eyes, ears, mouth and genitals (private parts). Wash Face and genitals (private parts)  with your normal soap.  6. Wash thoroughly, paying special attention to the area where your surgery will be performed.  7.  Thoroughly rinse your body with warm water from the neck down.  8. DO NOT shower/wash with your normal soap after using and rinsing off the CHG Soap.  9. Pat yourself dry with a CLEAN TOWEL.  10. Wear CLEAN PAJAMAS to bed the night before surgery, wear comfortable clothes the morning of surgery  11. Place CLEAN SHEETS on your bed the night of your first shower and DO NOT SLEEP WITH PETS.  Day of Surgery: Do not apply any deodorants/lotions. Please wear clean clothes to the hospital/surgery center.    Please  read over the following fact sheets that you were given. Pain Booklet, Coughing and Deep Breathing, MRSA Information and Surgical Site Infection Prevention

## 2017-09-06 ENCOUNTER — Encounter (HOSPITAL_COMMUNITY): Payer: Self-pay | Admitting: Physician Assistant

## 2017-09-06 ENCOUNTER — Encounter: Admission: AD | Payer: Self-pay | Source: Ambulatory Visit

## 2017-09-06 ENCOUNTER — Encounter (HOSPITAL_COMMUNITY): Payer: Self-pay

## 2017-09-06 ENCOUNTER — Other Ambulatory Visit: Payer: Self-pay

## 2017-09-06 ENCOUNTER — Encounter (HOSPITAL_COMMUNITY)
Admission: RE | Admit: 2017-09-06 | Discharge: 2017-09-06 | Disposition: A | Discharge status: Home / Self Care | Payer: Medicare Other | Source: Ambulatory Visit | Attending: Orthopedic Surgery | Admitting: Orthopedic Surgery

## 2017-09-06 DIAGNOSIS — Z79899 Other long term (current) drug therapy: Secondary | ICD-10-CM | POA: Diagnosis not present

## 2017-09-06 DIAGNOSIS — G4733 Obstructive sleep apnea (adult) (pediatric): Secondary | ICD-10-CM | POA: Insufficient documentation

## 2017-09-06 DIAGNOSIS — E785 Hyperlipidemia, unspecified: Secondary | ICD-10-CM | POA: Diagnosis not present

## 2017-09-06 DIAGNOSIS — K219 Gastro-esophageal reflux disease without esophagitis: Secondary | ICD-10-CM | POA: Diagnosis not present

## 2017-09-06 DIAGNOSIS — Z01812 Encounter for preprocedural laboratory examination: Secondary | ICD-10-CM | POA: Insufficient documentation

## 2017-09-06 DIAGNOSIS — Z87891 Personal history of nicotine dependence: Secondary | ICD-10-CM | POA: Diagnosis not present

## 2017-09-06 DIAGNOSIS — I10 Essential (primary) hypertension: Secondary | ICD-10-CM | POA: Diagnosis not present

## 2017-09-06 DIAGNOSIS — H8110 Benign paroxysmal vertigo, unspecified ear: Secondary | ICD-10-CM | POA: Diagnosis not present

## 2017-09-06 DIAGNOSIS — Z9889 Other specified postprocedural states: Secondary | ICD-10-CM | POA: Insufficient documentation

## 2017-09-06 DIAGNOSIS — E039 Hypothyroidism, unspecified: Secondary | ICD-10-CM | POA: Insufficient documentation

## 2017-09-06 DIAGNOSIS — K2270 Barrett's esophagus without dysplasia: Secondary | ICD-10-CM | POA: Insufficient documentation

## 2017-09-06 DIAGNOSIS — M1712 Unilateral primary osteoarthritis, left knee: Secondary | ICD-10-CM

## 2017-09-06 DIAGNOSIS — Z7982 Long term (current) use of aspirin: Secondary | ICD-10-CM | POA: Insufficient documentation

## 2017-09-06 DIAGNOSIS — Z96651 Presence of right artificial knee joint: Secondary | ICD-10-CM

## 2017-09-06 HISTORY — DX: Unilateral primary osteoarthritis, left knee: M17.12

## 2017-09-06 HISTORY — DX: Presence of right artificial knee joint: Z96.651

## 2017-09-06 LAB — COMPREHENSIVE METABOLIC PANEL
ALT: 19 U/L (ref 14–54)
AST: 23 U/L (ref 15–41)
Albumin: 4.3 g/dL (ref 3.5–5.0)
Alkaline Phosphatase: 97 U/L (ref 38–126)
Anion gap: 10 (ref 5–15)
BILIRUBIN TOTAL: 1.2 mg/dL (ref 0.3–1.2)
BUN: 11 mg/dL (ref 6–20)
CALCIUM: 9.3 mg/dL (ref 8.9–10.3)
CO2: 24 mmol/L (ref 22–32)
CREATININE: 0.6 mg/dL (ref 0.44–1.00)
Chloride: 100 mmol/L — ABNORMAL LOW (ref 101–111)
Glucose, Bld: 99 mg/dL (ref 65–99)
Potassium: 4.1 mmol/L (ref 3.5–5.1)
Sodium: 134 mmol/L — ABNORMAL LOW (ref 135–145)
TOTAL PROTEIN: 7.2 g/dL (ref 6.5–8.1)

## 2017-09-06 LAB — CBC WITH DIFFERENTIAL/PLATELET
BASOS ABS: 0 10*3/uL (ref 0.0–0.1)
Basophils Relative: 0 %
EOS PCT: 3 %
Eosinophils Absolute: 0.2 10*3/uL (ref 0.0–0.7)
HEMATOCRIT: 41.5 % (ref 36.0–46.0)
Hemoglobin: 13.3 g/dL (ref 12.0–15.0)
LYMPHS ABS: 1.1 10*3/uL (ref 0.7–4.0)
LYMPHS PCT: 19 %
MCH: 29.3 pg (ref 26.0–34.0)
MCHC: 32 g/dL (ref 30.0–36.0)
MCV: 91.4 fL (ref 78.0–100.0)
MONO ABS: 0.3 10*3/uL (ref 0.1–1.0)
Monocytes Relative: 4 %
NEUTROS ABS: 4.5 10*3/uL (ref 1.7–7.7)
Neutrophils Relative %: 74 %
Platelets: 248 10*3/uL (ref 150–400)
RBC: 4.54 MIL/uL (ref 3.87–5.11)
RDW: 14.3 % (ref 11.5–15.5)
WBC: 6 10*3/uL (ref 4.0–10.5)

## 2017-09-06 LAB — PROTIME-INR
INR: 0.99
PROTHROMBIN TIME: 13 s (ref 11.4–15.2)

## 2017-09-06 LAB — APTT: APTT: 33 s (ref 24–36)

## 2017-09-06 LAB — SURGICAL PCR SCREEN
MRSA, PCR: NEGATIVE
Staphylococcus aureus: POSITIVE — AB

## 2017-09-06 SURGERY — ARTHROPLASTY, SHOULDER, TOTAL
Anesthesia: Choice | Laterality: Left

## 2017-09-06 NOTE — Progress Notes (Signed)
PCP - Dr. Hortencia Pilar Cardiologist - Dr. Nehemiah Massed  Chest x-ray - n/a EKG - 07/18/2017, requesting tracing Stress Test - 07/26/2017, requesting records ECHO - 07/27/2017, in care everywhere Cardiac Cath - patient denies  Sleep Study - yes CPAP - yes  Aspirin Instructions: patient states she is to stop 7 days prior to surgery  Anesthesia review: yes, cardiac history  Patient denies shortness of breath, fever, cough and chest pain at PAT appointment   Patient verbalized understanding of instructions that were given to them at the PAT appointment. Patient was also instructed that they will need to review over the PAT instructions again at home before surgery.

## 2017-09-06 NOTE — H&P (Addendum)
TOTAL KNEE ADMISSION H&P  Patient is being admitted for left total knee arthroplasty.  Subjective:  Chief Complaint:left knee pain.  HPI: Barbara Hernandez, 77 y.o. female, has a history of pain and functional disability in the left knee due to arthritis and has failed non-surgical conservative treatments for greater than 12 weeks to includeNSAID's and/or analgesics, corticosteriod injections, viscosupplementation injections, flexibility and strengthening excercises, supervised PT with diminished ADL's post treatment, use of assistive devices and activity modification.  Onset of symptoms was gradual, starting 10 years ago with gradually worsening course since that time. The patient noted no past surgery on the left knee(s).  Patient currently rates pain in the left knee(s) at 10 out of 10 with activity. Patient has night pain, worsening of pain with activity and weight bearing, pain that interferes with activities of daily living, crepitus and joint swelling.  Patient has evidence of subchondral sclerosis, periarticular osteophytes and joint space narrowing by imaging studies.There is no active infection.  Patient Active Problem List   Diagnosis Date Noted  . Primary localized osteoarthritis of left knee 09/06/2017  . 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 Primary localized osteoarthritis of right knee 08/09/2016  . Primary localized osteoarthrosis of the knee, right 07/28/2016  . Benign essential HTN 03/18/2015  . 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 Arthritis, degenerative 11/22/2014  . VFD (visual field defect) 11/22/2014  . Hypothyroidism 10/08/2014  . Barrett's esophagus 10/08/2014  . Hypercholesteremia 10/08/2014  . Hypertension 10/08/2014  . Hematuria 10/08/2014  . Onychomycosis 10/08/2014  . Mild left ventricular hypertrophy 10/08/2014  . Pernicious anemia 10/08/2014  . Vitamin D deficiency 10/08/2014  . Vertigo, benign paroxysmal 10/08/2014  . Systolic murmur 27/11/2374  .  Sleep apnea 10/08/2014  . Arthritis of shoulder region, right, degenerative 10/08/2014  . Allergic rhinitis 10/08/2014  . 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 MI (mitral incompetence) 08/14/2014  . Esophageal reflux 09/07/2012  . Hemorrhoid    Past Medical History:  Diagnosis Date  . Anemia   . Arthritis   . Barrett's esophagus 2012   2010 upper endoscopy showed only reflux. 2012 biopsies suggested Barrett's epithelial changes.  . Bladder tumor   . BPPV (benign paroxysmal positional vertigo) 2004  . Family history of adverse reaction to anesthesia    sister had memory problems after delivery of child  . GERD (gastroesophageal reflux disease)   . H/O measles   . Heart murmur   . Hematuria   . Hematuria   . Hemorrhoid   . Hemorrhoids   . History of chicken pox   . Hyperlipidemia   . Hypertension   . Hypothyroidism   . Joint pain   . Mild left ventricular hypertrophy   . Onychomycosis   . Primary localized osteoarthritis of left knee 09/06/2017  . Reflux   . Reflux esophagitis   . 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 Sleep apnea 2011   Uses C-Pap machine  . Thyroid disease     Past Surgical History:  Procedure Laterality Date  . BLEPHAROPLASTY Bilateral 2015  . BREAST EXCISIONAL BIOPSY Left 1988   benign  . COLONOSCOPY W/ BIOPSIES N/A 01/16/2013   No source for GI blood loss, mild lymphatic prominence in the rectum.  . ESOPHAGOGASTRODUODENOSCOPY (EGD) WITH PROPOFOL N/A 03/15/2016   Procedure: ESOPHAGOGASTRODUODENOSCOPY (EGD) WITH PROPOFOL;  Surgeon: Robert Bellow, MD;  Location: [facility];  Service: Endoscopy;  Laterality: N/A;  . EYE SURGERY Bilateral 2006   cataract  . Left wrist fracture    .  POPLITEAL SYNOVIAL CYST EXCISION Right   . TOTAL KNEE ARTHROPLASTY Right 08/09/2016   Procedure: TOTAL KNEE ARTHROPLASTY;  Surgeon: Elsie Saas, MD;  Location: [place];  Service: Orthopedics;  Laterality: Right;  . TRANSURETHRAL RESECTION OF BLADDER TUMOR  N/A 11/20/2014   Procedure: TRANSURETHRAL RESECTION OF BLADDER TUMOR (TURBT);  Surgeon: Hollice Espy, MD;  Location: [facility];  Service: Urology;  Laterality: N/A;  . UPPER GI ENDOSCOPY  2012, 2014    No current facility-administered medications for this encounter.    Current Outpatient Medications  Medication Sig Dispense Refill Last Dose  . acetaminophen (TYLENOL) 500 MG tablet Take 500 mg by mouth every 6 (six) hours as needed for mild pain.   09/06/2017 at Unknown time  . Artificial Tear Solution (GENTEAL TEARS OP) Apply 1 application to eye at bedtime.   09/06/2017 at Unknown time  . aspirin EC 81 MG tablet Take 81 mg by mouth daily.   09/06/2017 at Unknown time  . Calcium Carbonate-Vit D-Min (CALCIUM 1200 PO) Take 1 tablet by mouth daily.    09/06/2017 at Unknown time  . carboxymethylcellulose (REFRESH PLUS) 0.5 % SOLN Place 1 drop into both eyes every morning.    09/06/2017 at Unknown time  . celecoxib (CELEBREX) 200 MG capsule Take 1 capsule (200 mg total) by mouth daily. am 90 capsule 3 09/06/2017 at Unknown time  . Cholecalciferol (VITAMIN D3) 2000 UNITS TABS Take 1 tablet by mouth every evening. bedtime   09/06/2017 at Unknown time  . Docusate Sodium 100 MG capsule 1 tab 2 times a day while on narcotics.  STOOL SOFTENER (Patient taking differently: Take 100 mg by mouth every evening. ) 60 tablet 0 09/05/2017 at Unknown time  . levothyroxine (SYNTHROID, LEVOTHROID) 125 MCG tablet Take 1 tablet (125 mcg total) by mouth daily. 30 tablet 3 09/06/2017 at Unknown time  . lisinopril (PRINIVIL,ZESTRIL) 40 MG tablet Take 1 tablet (40 mg total) by mouth daily. bedtime 90 tablet 3 09/06/2017 at Unknown time  . omeprazole (PRILOSEC) 20 MG capsule Take 1 capsule (20 mg total) by mouth daily. 90 capsule 3 09/06/2017 at Unknown time  . simvastatin (ZOCOR) 20 MG tablet Take 1 tablet (20 mg total) by mouth daily. bedtime (Patient taking differently: Take 20 mg by mouth daily at 6 PM. bedtime) 90 tablet 3 09/05/2017  at Unknown time  . spironolactone (ALDACTONE) 25 MG tablet Take 25 mg by mouth daily.   09/06/2017 at Unknown time  . vitamin B-12 (CYANOCOBALAMIN) 100 MCG tablet Take 100 mcg by mouth daily.   09/06/2017 at Unknown time  . ondansetron (ZOFRAN) 4 MG tablet Take 1 tablet (4 mg total) by mouth every 6 (six) hours as needed for nausea. 20 tablet 0 More than a month at Unknown time   Allergies  Allergen Reactions  . Carvedilol Other (See Comments)    No energy, fatigued   . Hctz [Hydrochlorothiazide] Other (See Comments)    Abnormal labs    Social History   Tobacco Use  . Smoking status: Former Smoker    Packs/day: 0.50    Years: 20.00    Pack years: 10.00    Types: Cigarettes    Last attempt to quit: 06/20/1977    Years since quitting: 40.2  . Smokeless tobacco: Never Used  Substance Use Topics  . Alcohol use: Yes    Alcohol/week: 0.6 - 1.8 oz    Types: 1 - 2 Standard drinks or equivalent per week    Family History  Problem Relation  Age of Onset  . Heart disease Mother        CHF, CAD  . Hypertension Mother   . Mental illness Mother        Dementia  . Cancer Mother        breast, Brain Cancer  . Breast cancer Mother 69  . Cancer Father        lung  . Heart disease Father        MI  . Heart disease Sister   . Cancer Sister        Breast  . Barrett's esophagus Sister   . Hyperlipidemia Sister   . Breast cancer Sister 36  . Asthma Brother   . Heart disease Brother   . Diabetes Brother   . Anxiety disorder Brother   . Neuropathy Brother   . Neuropathy Sister   . Anxiety disorder Sister   . Mental illness Sister        Dementia  . Fibromyalgia Sister      Review of Systems  Constitutional: Negative.   HENT: Negative.   Eyes: Negative.   Respiratory: Negative.   Cardiovascular: Negative.   Gastrointestinal: Negative.   Genitourinary: Negative.   Musculoskeletal: Positive for back pain and joint pain.  Skin: Negative.   Neurological: Negative.    Endo/Heme/Allergies: Negative.   Psychiatric/Behavioral: Negative.     Objective:  Physical Exam  Constitutional: She is oriented to person, place, and time. She appears well-developed and well-nourished.  HENT:  Head: Normocephalic and atraumatic.  Mouth/Throat: Oropharynx is clear and moist.  Eyes: Conjunctivae are normal. Pupils are equal, round, and reactive to light.  Neck: Neck supple.  Cardiovascular: Normal rate.  Murmur heard. Respiratory: Effort normal and breath sounds normal.  GI: Soft. Bowel sounds are normal.  Genitourinary:  Genitourinary Comments: Not pertinent to current symptomatology therefore not examined.  Musculoskeletal:  Examination of her left knee reveals pain medially.  1+ effusion.  Range of motion 0-120 degrees.  Knee is stable with normal patella tracking.  Examination of her right knee reveals well healed total knee incision with minimal swelling.  Minimal pain.  Range of motion 0-125 degrees.  Knee is stable with normal patella tracking.  Vascular exam: Pulses are 2+ and symmetric.    Neurological: She is alert and oriented to person, place, and time.  Skin: Skin is warm and dry.  Psychiatric: She has a normal mood and affect. Her behavior is normal.    Vital signs in last 24 hours: Temp:  [97.7 F (36.5 C)] 97.7 F (36.5 C) (03/19 1000) Pulse Rate:  [59] 59 (03/19 1000) BP: (179)/(82) 179/82 (03/19 1000) SpO2:  [97 %] 97 % (03/19 1000) Weight:  [98.9 kg (218 lb)] 98.9 kg (218 lb) (03/19 1000)  Labs:   Estimated body mass index is 38.62 kg/m as calculated from the following:   Height as of this encounter: 5\' 3"  (1.6 m).   Weight as of this encounter: 98.9 kg (218 lb).   Imaging Review Plain radiographs demonstrate severe degenerative joint disease of the left knee(s). The overall alignment ismild varus. The bone quality appears to be good for age and reported activity level.   Preoperative templating of the joint replacement has been  completed, documented, and submitted to the Operating Room personnel in order to optimize intra-operative equipment management.    Patient's anticipated LOS is less than 2 midnights, meeting these requirements:  - Lives within 1 hour of care - Has a competent adult at  home to recover with post-op recover - NO history of  - Chronic pain requiring opiods  - Diabetes  - Coronary Artery Disease  - Heart failure  - Heart attack  - Stroke  - DVT/VTE  - Cardiac arrhythmia  - Respiratory Failure/COPD  - Renal failure  - Anemia  - Advanced Liver disease        Assessment/Plan:  End stage arthritis, left knee  Principal Problem:   Primary localized osteoarthritis of left knee Active Problems:   Esophageal reflux   Hypothyroidism   Barrett's esophagus   Hypercholesteremia   Hypertension   Pernicious anemia   Systolic murmur   Sleep apnea   MI (mitral incompetence)   S/P total knee replacement, right   The patient history, physical examination, clinical judgment of the provider and imaging studies are consistent with end stage degenerative joint disease of the left knee(s) and total knee arthroplasty is deemed medically necessary. The treatment options including medical management, injection therapy arthroscopy and arthroplasty were discussed at length. The risks and benefits of total knee arthroplasty were presented and reviewed. The risks due to aseptic loosening, infection, stiffness, patella tracking problems, thromboembolic complications and other imponderables were discussed. The patient acknowledged the explanation, agreed to proceed with the plan and consent was signed. Patient is being admitted for inpatient treatment for surgery, pain control, PT, OT, prophylactic antibiotics, VTE prophylaxis, progressive ambulation and ADL's and discharge planning. The patient is planning to be discharged home with home health services  PCR positive for MSSA   Started mupirocin on 3/20.   Will use Zinacef in the [place].

## 2017-09-07 LAB — URINE CULTURE: Culture: NO GROWTH

## 2017-09-09 ENCOUNTER — Other Ambulatory Visit (HOSPITAL_COMMUNITY): Payer: Medicare Other

## 2017-09-09 NOTE — Progress Notes (Signed)
Anesthesia chart review: Patient is a 77 year old female scheduled for left total knee replacement on 09/19/17 Dr. Elsie Saas. Case is posted for spinal anesthesia.  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 [facility] Cardiology notes indicate that she has mild < 50 % carotid atherosclerosis. BMI is consistent with obesity.  - PCP is Dr. Arrie Aran with [facility] (see Care Everywhere). Last visit 07/29/17. She signed a note of cardiac clearance with permission to hold ASA and Celebrex X 7 days.  - Cardiologist is Dr. Serafina Royals with [facility] (see Care Everywhere), last visit 08/31/17 for an acute exacerbation of hypertension (180/100). She had had medication adjustment on 08/05/17 (decreased b-blocker due to side effects), but had also not tolerated calcium channel blocker due to edema and HCTZ due to hyponatremia. He added Aldactone with plans for two month follow-up. At her 08/05/17 visit he wrote, "Proceed to surgery and/or invasive procedure without restriction to pre or post operative and/or procedural care. The patient is at lowest risk possible for cardiovascular complications with surgical intervention and/or invasive procedure. Currently has no evidence active and/or significant angina and/or congestive heart failure. The patient may discontinue aspirin 7 days prior to procedure and restart at a safe period thereafter."  Meds include aspirin 81 mg (holding for 7 days before surgery), calcium with vitamin D, Celebrex, Synthroid, lisinopril, Prilosec, Zocor, Aldactone.  BP (!) 168/71   Pulse 61   Temp 36.4 C   Resp 20   Ht 5\' 3"  (1.6 m)   Wt 218 lb (98.9 kg)   SpO2 100%   BMI 38.62 kg/m   EKG 07/18/17  [facility]): NSR.  Echo 07/27/17 ([facility]; Care Everywhere): INTERPRETATION NORMAL LEFT VENTRICULAR SYSTOLIC FUNCTION WITH AN ESTIMATED EF = 55 % NORMAL RIGHT VENTRICULAR SYSTOLIC FUNCTION MILD-TO-MODERATE MITRAL VALVE INSUFFICIENCY MILD TRICUSPID VALVE INSUFFICIENCY NO VALVULAR STENOSIS MILD BIATRIAL ENLARGEMENT  Exercise stress test 07/26/17 [facility] Cardiology): Summary:  Resting ECG: Normal. Functional Capacity: Slightly Decreased (20-30%). HR Response to Exercise: Chronotropic Incompetence. Chest Pain: No Chest Pain. Arrhythmias: No Arrhythmias.  ST Changes: None. Conclusions: Normal treadmill ECG without evidence of ischemia or arrhythmia.  Carotid Duplex 07/03/13 [facility] Cardiology; scanned under Media tab; Correspondence, 08/09/16): Impression: Less than 50% stenosis in the right and left internal carotid arteries.  Preoperative labs noted. Na 134, K 4.1, Cr 0.60. CBC WNL. PT/PTT WNL. Glucose 99. Urine culture showed no growth.  Patient has cardiology and PCP clearances. I spoke with Matthew Saras PA-C regarding recent cardiology follow-up for elevated BP, but new medication added. BP still elevated, but improved since that visit. Patient monitoring BP at home as well. If no acute changes then I would anticipate that she can proceed as planned.  George Hugh [facility] Short Stay Center/Anesthesiology Phone [phone] 09/09/2017 3:41 PM

## 2017-09-16 MED ORDER — TRANEXAMIC ACID 1000 MG/10ML IV SOLN
1000.0000 mg | INTRAVENOUS | Status: AC
  Administered 2017-09-19: 1000 mg via INTRAVENOUS
  Filled 2017-09-16: qty 1100

## 2017-09-16 MED ORDER — BUPIVACAINE LIPOSOME 1.3 % IJ SUSP
20.0000 mL | INTRAMUSCULAR | Status: AC
  Administered 2017-09-19: 20 mL
  Filled 2017-09-16: qty 20

## 2017-09-19 ENCOUNTER — Ambulatory Visit (HOSPITAL_COMMUNITY): Payer: Medicare Other | Admitting: Anesthesiology

## 2017-09-19 ENCOUNTER — Observation Stay (HOSPITAL_COMMUNITY)
Admission: RE | Admit: 2017-09-19 | Discharge: 2017-09-20 | Disposition: A | Discharge status: Home / Self Care | Payer: Medicare Other | Source: Ambulatory Visit | Attending: Orthopedic Surgery | Admitting: Orthopedic Surgery

## 2017-09-19 ENCOUNTER — Ambulatory Visit (HOSPITAL_COMMUNITY): Payer: Medicare Other | Admitting: Emergency Medicine

## 2017-09-19 ENCOUNTER — Other Ambulatory Visit: Payer: Self-pay

## 2017-09-19 ENCOUNTER — Encounter (HOSPITAL_COMMUNITY): Payer: Self-pay

## 2017-09-19 ENCOUNTER — Encounter (HOSPITAL_COMMUNITY)
Admission: RE | Disposition: A | Discharge status: Home / Self Care | Payer: Self-pay | Source: Ambulatory Visit | Attending: Orthopedic Surgery

## 2017-09-19 DIAGNOSIS — K210 Gastro-esophageal reflux disease with esophagitis: Secondary | ICD-10-CM | POA: Insufficient documentation

## 2017-09-19 DIAGNOSIS — I739 Peripheral vascular disease, unspecified: Secondary | ICD-10-CM | POA: Insufficient documentation

## 2017-09-19 DIAGNOSIS — R011 Cardiac murmur, unspecified: Secondary | ICD-10-CM | POA: Insufficient documentation

## 2017-09-19 DIAGNOSIS — Z825 Family history of asthma and other chronic lower respiratory diseases: Secondary | ICD-10-CM | POA: Insufficient documentation

## 2017-09-19 DIAGNOSIS — D510 Vitamin B12 deficiency anemia due to intrinsic factor deficiency: Secondary | ICD-10-CM | POA: Diagnosis present

## 2017-09-19 DIAGNOSIS — Z803 Family history of malignant neoplasm of breast: Secondary | ICD-10-CM | POA: Insufficient documentation

## 2017-09-19 DIAGNOSIS — H5340 Unspecified visual field defects: Secondary | ICD-10-CM | POA: Insufficient documentation

## 2017-09-19 DIAGNOSIS — Z8379 Family history of other diseases of the digestive system: Secondary | ICD-10-CM | POA: Insufficient documentation

## 2017-09-19 DIAGNOSIS — I252 Old myocardial infarction: Secondary | ICD-10-CM | POA: Diagnosis not present

## 2017-09-19 DIAGNOSIS — Z808 Family history of malignant neoplasm of other organs or systems: Secondary | ICD-10-CM | POA: Diagnosis not present

## 2017-09-19 DIAGNOSIS — K219 Gastro-esophageal reflux disease without esophagitis: Secondary | ICD-10-CM | POA: Diagnosis present

## 2017-09-19 DIAGNOSIS — D649 Anemia, unspecified: Secondary | ICD-10-CM | POA: Diagnosis not present

## 2017-09-19 DIAGNOSIS — G4730 Sleep apnea, unspecified: Secondary | ICD-10-CM | POA: Diagnosis present

## 2017-09-19 DIAGNOSIS — H8110 Benign paroxysmal vertigo, unspecified ear: Secondary | ICD-10-CM | POA: Insufficient documentation

## 2017-09-19 DIAGNOSIS — Z96651 Presence of right artificial knee joint: Secondary | ICD-10-CM | POA: Diagnosis not present

## 2017-09-19 DIAGNOSIS — Z87891 Personal history of nicotine dependence: Secondary | ICD-10-CM | POA: Diagnosis not present

## 2017-09-19 DIAGNOSIS — Z818 Family history of other mental and behavioral disorders: Secondary | ICD-10-CM | POA: Diagnosis not present

## 2017-09-19 DIAGNOSIS — E039 Hypothyroidism, unspecified: Secondary | ICD-10-CM | POA: Diagnosis not present

## 2017-09-19 DIAGNOSIS — I340 Nonrheumatic mitral (valve) insufficiency: Secondary | ICD-10-CM | POA: Diagnosis present

## 2017-09-19 DIAGNOSIS — E7800 Pure hypercholesterolemia, unspecified: Secondary | ICD-10-CM | POA: Insufficient documentation

## 2017-09-19 DIAGNOSIS — Z7982 Long term (current) use of aspirin: Secondary | ICD-10-CM | POA: Insufficient documentation

## 2017-09-19 DIAGNOSIS — Z79899 Other long term (current) drug therapy: Secondary | ICD-10-CM | POA: Insufficient documentation

## 2017-09-19 DIAGNOSIS — B351 Tinea unguium: Secondary | ICD-10-CM | POA: Diagnosis not present

## 2017-09-19 DIAGNOSIS — K2270 Barrett's esophagus without dysplasia: Secondary | ICD-10-CM | POA: Diagnosis not present

## 2017-09-19 DIAGNOSIS — I10 Essential (primary) hypertension: Secondary | ICD-10-CM | POA: Diagnosis present

## 2017-09-19 DIAGNOSIS — Z820 Family history of epilepsy and other diseases of the nervous system: Secondary | ICD-10-CM | POA: Insufficient documentation

## 2017-09-19 DIAGNOSIS — Z801 Family history of malignant neoplasm of trachea, bronchus and lung: Secondary | ICD-10-CM | POA: Diagnosis not present

## 2017-09-19 DIAGNOSIS — I517 Cardiomegaly: Secondary | ICD-10-CM | POA: Insufficient documentation

## 2017-09-19 DIAGNOSIS — Z8249 Family history of ischemic heart disease and other diseases of the circulatory system: Secondary | ICD-10-CM | POA: Insufficient documentation

## 2017-09-19 DIAGNOSIS — M1712 Unilateral primary osteoarthritis, left knee: Principal | ICD-10-CM | POA: Insufficient documentation

## 2017-09-19 DIAGNOSIS — Z833 Family history of diabetes mellitus: Secondary | ICD-10-CM | POA: Insufficient documentation

## 2017-09-19 HISTORY — PX: TOTAL KNEE ARTHROPLASTY: SHX125

## 2017-09-19 HISTORY — DX: Unilateral primary osteoarthritis, left knee: M17.12

## 2017-09-19 HISTORY — DX: Presence of right artificial knee joint: Z96.651

## 2017-09-19 SURGERY — ARTHROPLASTY, KNEE, TOTAL
Anesthesia: Spinal | Laterality: Left

## 2017-09-19 MED ORDER — BUPIVACAINE-EPINEPHRINE (PF) 0.25% -1:200000 IJ SOLN
INTRAMUSCULAR | Status: AC
  Filled 2017-09-19: qty 30

## 2017-09-19 MED ORDER — ONDANSETRON HCL 4 MG/2ML IJ SOLN
INTRAMUSCULAR | Status: DC | PRN
  Administered 2017-09-19: 4 mg via INTRAVENOUS

## 2017-09-19 MED ORDER — GENTEAL TEARS 0.1-0.2-0.3 % OP SOLN: 1.0000 [drp] | Freq: Every day | OPHTHALMIC | Status: DC

## 2017-09-19 MED ORDER — ONDANSETRON HCL 4 MG/2ML IJ SOLN
4.0000 mg | Freq: Four times a day (QID) | INTRAMUSCULAR | Status: DC | PRN
  Administered 2017-09-19: 4 mg via INTRAVENOUS

## 2017-09-19 MED ORDER — ALUM & MAG HYDROXIDE-SIMETH 200-200-20 MG/5ML PO SUSP: 30.0000 mL | ORAL | Status: DC | PRN

## 2017-09-19 MED ORDER — METOCLOPRAMIDE HCL 5 MG/ML IJ SOLN
INTRAMUSCULAR | Status: AC
  Filled 2017-09-19: qty 2

## 2017-09-19 MED ORDER — PHENYLEPHRINE 40 MCG/ML (10ML) SYRINGE FOR IV PUSH (FOR BLOOD PRESSURE SUPPORT)
PREFILLED_SYRINGE | INTRAVENOUS | Status: AC
  Filled 2017-09-19: qty 10

## 2017-09-19 MED ORDER — HYDROMORPHONE HCL 1 MG/ML IJ SOLN
INTRAMUSCULAR | Status: AC
  Filled 2017-09-19: qty 1

## 2017-09-19 MED ORDER — CHLORHEXIDINE GLUCONATE 4 % EX LIQD: 60.0000 mL | Freq: Once | CUTANEOUS | Status: DC

## 2017-09-19 MED ORDER — ONDANSETRON HCL 4 MG PO TABS: 4.0000 mg | ORAL_TABLET | Freq: Four times a day (QID) | ORAL | Status: DC | PRN

## 2017-09-19 MED ORDER — VITAMIN D 1000 UNITS PO TABS
2000.0000 [IU] | ORAL_TABLET | Freq: Every evening | ORAL | Status: DC
  Administered 2017-09-19: 2000 [IU] via ORAL
  Filled 2017-09-19: qty 2

## 2017-09-19 MED ORDER — LIDOCAINE HCL (CARDIAC) 20 MG/ML IV SOLN
INTRAVENOUS | Status: DC | PRN
  Administered 2017-09-19: 60 mg via INTRAVENOUS

## 2017-09-19 MED ORDER — LIDOCAINE HCL (CARDIAC) 20 MG/ML IV SOLN
INTRAVENOUS | Status: AC
  Filled 2017-09-19: qty 5

## 2017-09-19 MED ORDER — PROPOFOL 10 MG/ML IV BOLUS
INTRAVENOUS | Status: DC | PRN
  Administered 2017-09-19: 180 mg via INTRAVENOUS

## 2017-09-19 MED ORDER — ASPIRIN EC 325 MG PO TBEC
325.0000 mg | DELAYED_RELEASE_TABLET | Freq: Every day | ORAL | Status: DC
  Administered 2017-09-20: 325 mg via ORAL
  Filled 2017-09-19: qty 1

## 2017-09-19 MED ORDER — BUPIVACAINE-EPINEPHRINE 0.5% -1:200000 IJ SOLN
INTRAMUSCULAR | Status: DC | PRN
  Administered 2017-09-19: 50 mL

## 2017-09-19 MED ORDER — VITAMIN B-12 100 MCG PO TABS
100.0000 ug | ORAL_TABLET | Freq: Every day | ORAL | Status: DC
  Administered 2017-09-20: 100 ug via ORAL
  Filled 2017-09-19: qty 1

## 2017-09-19 MED ORDER — SODIUM CHLORIDE 0.9 % IJ SOLN
INTRAMUSCULAR | Status: DC | PRN
  Administered 2017-09-19: 10 mL

## 2017-09-19 MED ORDER — FENTANYL CITRATE (PF) 250 MCG/5ML IJ SOLN
INTRAMUSCULAR | Status: AC
  Filled 2017-09-19: qty 5

## 2017-09-19 MED ORDER — 0.9 % SODIUM CHLORIDE (POUR BTL) OPTIME
TOPICAL | Status: DC | PRN
  Administered 2017-09-19: 1000 mL

## 2017-09-19 MED ORDER — CARBOXYMETHYLCELLULOSE SODIUM 0.5 % OP SOLN: 1.0000 [drp] | OPHTHALMIC | Status: DC

## 2017-09-19 MED ORDER — SIMVASTATIN 20 MG PO TABS
20.0000 mg | ORAL_TABLET | Freq: Every day | ORAL | Status: DC
  Administered 2017-09-19: 20 mg via ORAL
  Filled 2017-09-19: qty 1

## 2017-09-19 MED ORDER — CEFAZOLIN SODIUM-DEXTROSE 2-4 GM/100ML-% IV SOLN
2.0000 g | INTRAVENOUS | Status: AC
  Administered 2017-09-19: 2 g via INTRAVENOUS
  Filled 2017-09-19: qty 100

## 2017-09-19 MED ORDER — CEFUROXIME SODIUM 1.5 G IV SOLR
INTRAVENOUS | Status: AC
  Filled 2017-09-19: qty 1.5

## 2017-09-19 MED ORDER — POVIDONE-IODINE 7.5 % EX SOLN
Freq: Once | CUTANEOUS | Status: DC
  Filled 2017-09-19: qty 118

## 2017-09-19 MED ORDER — DOCUSATE SODIUM 100 MG PO CAPS
100.0000 mg | ORAL_CAPSULE | Freq: Two times a day (BID) | ORAL | Status: DC
  Administered 2017-09-19 – 2017-09-20 (×2): 100 mg via ORAL
  Filled 2017-09-19 (×2): qty 1

## 2017-09-19 MED ORDER — DEXAMETHASONE SODIUM PHOSPHATE 10 MG/ML IJ SOLN
INTRAMUSCULAR | Status: DC | PRN
  Administered 2017-09-19: 10 mg via INTRAVENOUS

## 2017-09-19 MED ORDER — ONDANSETRON HCL 4 MG/2ML IJ SOLN
INTRAMUSCULAR | Status: AC
  Filled 2017-09-19: qty 2

## 2017-09-19 MED ORDER — CALCIUM 1200 1200-1000 MG-UNIT PO CHEW: 1.0000 | CHEWABLE_TABLET | Freq: Every day | ORAL | Status: DC

## 2017-09-19 MED ORDER — POLYETHYLENE GLYCOL 3350 17 G PO PACK
17.0000 g | PACK | Freq: Two times a day (BID) | ORAL | Status: DC
  Administered 2017-09-20: 17 g via ORAL
  Filled 2017-09-19: qty 1

## 2017-09-19 MED ORDER — POLYVINYL ALCOHOL 1.4 % OP SOLN
1.0000 [drp] | Freq: Every day | OPHTHALMIC | Status: DC
  Administered 2017-09-19: 1 [drp] via OPHTHALMIC
  Filled 2017-09-19: qty 15

## 2017-09-19 MED ORDER — LACTATED RINGERS IV SOLN
INTRAVENOUS | Status: DC
  Administered 2017-09-19 (×2): via INTRAVENOUS

## 2017-09-19 MED ORDER — GABAPENTIN 300 MG PO CAPS
300.0000 mg | ORAL_CAPSULE | Freq: Every day | ORAL | Status: DC
  Administered 2017-09-19: 300 mg via ORAL
  Filled 2017-09-19: qty 1

## 2017-09-19 MED ORDER — PHENOL 1.4 % MT LIQD: 1.0000 | OROMUCOSAL | Status: DC | PRN

## 2017-09-19 MED ORDER — CEFUROXIME SODIUM 1.5 G IV SOLR
INTRAVENOUS | Status: DC | PRN
  Administered 2017-09-19: 1.5 g via INTRAVENOUS

## 2017-09-19 MED ORDER — HYDROMORPHONE HCL 1 MG/ML IJ SOLN
0.2500 mg | INTRAMUSCULAR | Status: DC | PRN
  Administered 2017-09-19: 0.5 mg via INTRAVENOUS

## 2017-09-19 MED ORDER — MENTHOL 3 MG MT LOZG: 1.0000 | LOZENGE | OROMUCOSAL | Status: DC | PRN

## 2017-09-19 MED ORDER — MIDAZOLAM HCL 2 MG/2ML IJ SOLN
INTRAMUSCULAR | Status: AC
  Filled 2017-09-19: qty 2

## 2017-09-19 MED ORDER — OXYCODONE HCL 5 MG PO TABS
5.0000 mg | ORAL_TABLET | ORAL | Status: DC | PRN
  Filled 2017-09-19: qty 2

## 2017-09-19 MED ORDER — EPHEDRINE 5 MG/ML INJ
INTRAVENOUS | Status: AC
  Filled 2017-09-19: qty 10

## 2017-09-19 MED ORDER — FENTANYL CITRATE (PF) 100 MCG/2ML IJ SOLN
INTRAMUSCULAR | Status: DC | PRN
  Administered 2017-09-19: 50 ug via INTRAVENOUS
  Administered 2017-09-19: 100 ug via INTRAVENOUS
  Administered 2017-09-19 (×2): 50 ug via INTRAVENOUS
  Administered 2017-09-19: 150 ug via INTRAVENOUS
  Administered 2017-09-19 (×2): 50 ug via INTRAVENOUS

## 2017-09-19 MED ORDER — PROPOFOL 10 MG/ML IV BOLUS
INTRAVENOUS | Status: AC
  Filled 2017-09-19: qty 20

## 2017-09-19 MED ORDER — PANTOPRAZOLE SODIUM 40 MG PO TBEC
40.0000 mg | DELAYED_RELEASE_TABLET | Freq: Every day | ORAL | Status: DC
  Administered 2017-09-20: 40 mg via ORAL
  Filled 2017-09-19: qty 1

## 2017-09-19 MED ORDER — CEFAZOLIN SODIUM-DEXTROSE 2-4 GM/100ML-% IV SOLN
2.0000 g | Freq: Four times a day (QID) | INTRAVENOUS | Status: AC
  Administered 2017-09-19 (×2): 2 g via INTRAVENOUS
  Filled 2017-09-19 (×2): qty 100

## 2017-09-19 MED ORDER — DEXAMETHASONE SODIUM PHOSPHATE 10 MG/ML IJ SOLN
INTRAMUSCULAR | Status: AC
  Filled 2017-09-19: qty 1

## 2017-09-19 MED ORDER — HYDROMORPHONE HCL 1 MG/ML IJ SOLN: 0.2500 mg | INTRAMUSCULAR | Status: DC | PRN

## 2017-09-19 MED ORDER — SUGAMMADEX SODIUM 200 MG/2ML IV SOLN
INTRAVENOUS | Status: DC | PRN
  Administered 2017-09-19: 100 mg via INTRAVENOUS

## 2017-09-19 MED ORDER — HYDROMORPHONE HCL 1 MG/ML IJ SOLN: 0.5000 mg | INTRAMUSCULAR | Status: DC | PRN

## 2017-09-19 MED ORDER — SODIUM CHLORIDE 0.9 % IR SOLN
Status: DC | PRN
  Administered 2017-09-19: 3000 mL

## 2017-09-19 MED ORDER — SUGAMMADEX SODIUM 200 MG/2ML IV SOLN
INTRAVENOUS | Status: AC
  Filled 2017-09-19: qty 2

## 2017-09-19 MED ORDER — MIDAZOLAM HCL 5 MG/5ML IJ SOLN
INTRAMUSCULAR | Status: DC | PRN
  Administered 2017-09-19: 2 mg via INTRAVENOUS

## 2017-09-19 MED ORDER — METOCLOPRAMIDE HCL 5 MG PO TABS: 5.0000 mg | ORAL_TABLET | Freq: Three times a day (TID) | ORAL | Status: DC | PRN

## 2017-09-19 MED ORDER — LEVOTHYROXINE SODIUM 25 MCG PO TABS
125.0000 ug | ORAL_TABLET | Freq: Every day | ORAL | Status: DC
  Administered 2017-09-20: 125 ug via ORAL
  Filled 2017-09-19: qty 1

## 2017-09-19 MED ORDER — DIPHENHYDRAMINE HCL 12.5 MG/5ML PO ELIX: 12.5000 mg | ORAL_SOLUTION | ORAL | Status: DC | PRN

## 2017-09-19 MED ORDER — DEXAMETHASONE SODIUM PHOSPHATE 10 MG/ML IJ SOLN
10.0000 mg | Freq: Three times a day (TID) | INTRAMUSCULAR | Status: DC
  Administered 2017-09-19 – 2017-09-20 (×3): 10 mg via INTRAVENOUS
  Filled 2017-09-19 (×3): qty 1

## 2017-09-19 MED ORDER — ROCURONIUM BROMIDE 10 MG/ML (PF) SYRINGE
PREFILLED_SYRINGE | INTRAVENOUS | Status: AC
  Filled 2017-09-19: qty 5

## 2017-09-19 MED ORDER — METOCLOPRAMIDE HCL 5 MG/ML IJ SOLN
5.0000 mg | Freq: Three times a day (TID) | INTRAMUSCULAR | Status: DC | PRN
  Administered 2017-09-19 (×2): 10 mg via INTRAVENOUS
  Filled 2017-09-19: qty 2

## 2017-09-19 MED ORDER — CALCIUM CARBONATE-VITAMIN D 500-200 MG-UNIT PO TABS
1.0000 | ORAL_TABLET | Freq: Two times a day (BID) | ORAL | Status: DC
  Administered 2017-09-19 – 2017-09-20 (×2): 1 via ORAL
  Filled 2017-09-19 (×2): qty 1

## 2017-09-19 MED ORDER — ACETAMINOPHEN 500 MG PO TABS
1000.0000 mg | ORAL_TABLET | Freq: Four times a day (QID) | ORAL | Status: AC
  Administered 2017-09-19 – 2017-09-20 (×4): 1000 mg via ORAL
  Filled 2017-09-19 (×4): qty 2

## 2017-09-19 MED ORDER — SUCCINYLCHOLINE CHLORIDE 200 MG/10ML IV SOSY
PREFILLED_SYRINGE | INTRAVENOUS | Status: AC
  Filled 2017-09-19: qty 10

## 2017-09-19 MED ORDER — POTASSIUM CHLORIDE IN NACL 20-0.9 MEQ/L-% IV SOLN
INTRAVENOUS | Status: DC
  Administered 2017-09-19: 15:00:00 via INTRAVENOUS
  Filled 2017-09-19: qty 1000

## 2017-09-19 MED ORDER — ROCURONIUM BROMIDE 100 MG/10ML IV SOLN
INTRAVENOUS | Status: DC | PRN
  Administered 2017-09-19: 50 mg via INTRAVENOUS

## 2017-09-19 SURGICAL SUPPLY — 73 items
APL SKNCLS STERI-STRIP NONHPOA (GAUZE/BANDAGES/DRESSINGS) ×1
BANDAGE ACE 6X5 VEL STRL LF (GAUZE/BANDAGES/DRESSINGS) ×1 IMPLANT
BANDAGE ESMARK 6X9 LF (GAUZE/BANDAGES/DRESSINGS) ×1 IMPLANT
BENZOIN TINCTURE PRP APPL 2/3 (GAUZE/BANDAGES/DRESSINGS) ×2 IMPLANT
BLADE SAGITTAL 25.0X1.19X90 (BLADE) ×2 IMPLANT
BLADE SAW SGTL 13X75X1.27 (BLADE) ×2 IMPLANT
BLADE SURG 10 STRL SS (BLADE) ×4 IMPLANT
BNDG CMPR 9X6 STRL LF SNTH (GAUZE/BANDAGES/DRESSINGS) ×1
BNDG CMPR MED 15X6 ELC VLCR LF (GAUZE/BANDAGES/DRESSINGS) ×1
BNDG ELASTIC 6X15 VLCR STRL LF (GAUZE/BANDAGES/DRESSINGS) ×2 IMPLANT
BNDG ESMARK 6X9 LF (GAUZE/BANDAGES/DRESSINGS) ×2
BOWL SMART MIX CTS (DISPOSABLE) ×2 IMPLANT
CAPT KNEE TOTAL 3 ATTUNE ×1 IMPLANT
CEMENT HV SMART SET (Cement) ×4 IMPLANT
CLSR STERI-STRIP ANTIMIC 1/2X4 (GAUZE/BANDAGES/DRESSINGS) ×1 IMPLANT
COVER SURGICAL LIGHT HANDLE (MISCELLANEOUS) ×2 IMPLANT
CUFF TOURNIQUET SINGLE 34IN LL (TOURNIQUET CUFF) ×2 IMPLANT
CUFF TOURNIQUET SINGLE 44IN (TOURNIQUET CUFF) IMPLANT
DECANTER SPIKE VIAL GLASS SM (MISCELLANEOUS) ×2 IMPLANT
DRAPE EXTREMITY T 121X128X90 (DRAPE) ×2 IMPLANT
DRAPE HALF SHEET 40X57 (DRAPES) ×4 IMPLANT
DRAPE INCISE IOBAN 66X45 STRL (DRAPES) IMPLANT
DRAPE ORTHO SPLIT 77X108 STRL (DRAPES) ×2
DRAPE SURG ORHT 6 SPLT 77X108 (DRAPES) ×1 IMPLANT
DRAPE U-SHAPE 47X51 STRL (DRAPES) ×2 IMPLANT
DRSG AQUACEL AG ADV 3.5X10 (GAUZE/BANDAGES/DRESSINGS) ×2 IMPLANT
DURAPREP 26ML APPLICATOR (WOUND CARE) ×2 IMPLANT
ELECT CAUTERY BLADE 6.4 (BLADE) ×2 IMPLANT
ELECT REM PT RETURN 9FT ADLT (ELECTROSURGICAL) ×2
ELECTRODE REM PT RTRN 9FT ADLT (ELECTROSURGICAL) ×1 IMPLANT
FACESHIELD WRAPAROUND (MASK) ×2 IMPLANT
FACESHIELD WRAPAROUND OR TEAM (MASK) ×1 IMPLANT
GLOVE BIO SURGEON STRL SZ7 (GLOVE) ×2 IMPLANT
GLOVE BIOGEL PI IND STRL 7.0 (GLOVE) ×1 IMPLANT
GLOVE BIOGEL PI IND STRL 7.5 (GLOVE) ×1 IMPLANT
GLOVE BIOGEL PI INDICATOR 7.0 (GLOVE) ×1
GLOVE BIOGEL PI INDICATOR 7.5 (GLOVE) ×1
GLOVE SS BIOGEL STRL SZ 7.5 (GLOVE) ×1 IMPLANT
GLOVE SUPERSENSE BIOGEL SZ 7.5 (GLOVE) ×1
GOWN STRL REUS W/ TWL LRG LVL3 (GOWN DISPOSABLE) ×1 IMPLANT
GOWN STRL REUS W/ TWL XL LVL3 (GOWN DISPOSABLE) ×1 IMPLANT
GOWN STRL REUS W/TWL LRG LVL3 (GOWN DISPOSABLE) ×2
GOWN STRL REUS W/TWL XL LVL3 (GOWN DISPOSABLE) ×2
HANDPIECE INTERPULSE COAX TIP (DISPOSABLE) ×2
HOOD PEEL AWAY FACE SHEILD DIS (HOOD) ×4 IMPLANT
IMMOBILIZER KNEE 22 (SOFTGOODS) ×1 IMPLANT
IMMOBILIZER KNEE 22 UNIV (SOFTGOODS) ×2 IMPLANT
KIT BASIN OR (CUSTOM PROCEDURE TRAY) ×2 IMPLANT
KIT TURNOVER KIT B (KITS) ×2 IMPLANT
MANIFOLD NEPTUNE II (INSTRUMENTS) ×2 IMPLANT
MARKER SKIN DUAL TIP RULER LAB (MISCELLANEOUS) ×2 IMPLANT
NEEDLE HYPO 22GX1.5 SAFETY (NEEDLE) ×4 IMPLANT
NS IRRIG 1000ML POUR BTL (IV SOLUTION) ×2 IMPLANT
PACK TOTAL JOINT (CUSTOM PROCEDURE TRAY) ×2 IMPLANT
PAD ARMBOARD 7.5X6 YLW CONV (MISCELLANEOUS) ×4 IMPLANT
PIN STEINMAN FIXATION KNEE (PIN) IMPLANT
SET HNDPC FAN SPRY TIP SCT (DISPOSABLE) ×1 IMPLANT
STRIP CLOSURE SKIN 1/2X4 (GAUZE/BANDAGES/DRESSINGS) ×2 IMPLANT
SUCTION FRAZIER HANDLE 10FR (MISCELLANEOUS) ×1
SUCTION TUBE FRAZIER 10FR DISP (MISCELLANEOUS) ×1 IMPLANT
SUT MNCRL AB 3-0 PS2 18 (SUTURE) ×2 IMPLANT
SUT VIC AB 0 CT1 27 (SUTURE) ×4
SUT VIC AB 0 CT1 27XBRD ANBCTR (SUTURE) ×2 IMPLANT
SUT VIC AB 1 CT1 27 (SUTURE) ×2
SUT VIC AB 1 CT1 27XBRD ANBCTR (SUTURE) ×1 IMPLANT
SUT VIC AB 2-0 CT1 27 (SUTURE) ×4
SUT VIC AB 2-0 CT1 TAPERPNT 27 (SUTURE) ×2 IMPLANT
SYR CONTROL 10ML LL (SYRINGE) ×4 IMPLANT
TOWEL OR 17X24 6PK STRL BLUE (TOWEL DISPOSABLE) ×2 IMPLANT
TOWEL OR 17X26 10 PK STRL BLUE (TOWEL DISPOSABLE) ×2 IMPLANT
TRAY CATH 16FR W/PLASTIC CATH (SET/KITS/TRAYS/PACK) IMPLANT
TRAY FOLEY CATH SILVER 16FR (SET/KITS/TRAYS/PACK) ×2 IMPLANT
WATER STERILE IRR 1000ML POUR (IV SOLUTION) ×2 IMPLANT

## 2017-09-19 NOTE — Interval H&P Note (Signed)
History and Physical Interval Note:  09/19/2017 7:00 AM  Barbara Hernandez  has presented today for surgery, with the diagnosis of OA LEFT KNEE  The various methods of treatment have been discussed with the patient and family. After consideration of risks, benefits and other options for treatment, the patient has consented to  Procedure(s): TOTAL KNEE ARTHROPLASTY (Left) as a surgical intervention .  The patient's history has been reviewed, patient examined, no change in status, stable for surgery.  I have reviewed the patient's chart and labs.  Questions were answered to the patient's satisfaction.     Lorn Junes

## 2017-09-19 NOTE — Op Note (Signed)
09/19/2017  9:04 AM  PATIENT:  Barbara Hernandez  77 y.o. female  MRN: [id number]  PRE-OPERATIVE DIAGNOSIS: PRIMARY LOCALIZED OA LEFT KNEE  POST-OPERATIVE DIAGNOSIS:  SAME  PROCEDURE:  Procedure(s): TOTAL KNEE ARTHROPLASTY   OPERATIVE REPORT     SURGEON:  Herbie Baltimore A. Noemi Chapel, MD      ASSISTANT:  Kirstin A. Shepperson, PA-C  (Present throughout the entire procedure     and necessary for completion of procedure in a timely manner)      ANESTHESIA:  Spinal with supplemental adductor canal nerve block.      COMPLICATIONS:  None.      COMPONENTS:  DePuy Attune cemented total knee system.  A #4 femoral component.   A #5  tibial tray with a 8-mm polyethylene RP tibial spacer , a 32-mm polyethylene patella.     PROCEDURE: The patient was met in the holding area, and the appropriate knee identified and marked.  The patient received a  preoperative femoral  nerve block by anesthesia.      The patient was then transported to OR and was placed on the operative table in a supine position. The patient was then placed under  general anesthesia without difficulty. The patient received appropriate preoperative antibiotic prophylaxis.  The patien'ts Left knee was examined under anesthesia and shown to have #0-125 range of motion, (varus/valgus/no) deformity, ligamentously stable, and normal patella tracking. Nursing staff inserted a Foley catheter under sterile conditions.        Tourniquet was applied to the operative  thigh.  Leg was then prepped using sterile conditions and draped using sterile technique.  Time-out procedure was called and correct  Left knee identified.    The  leg was elevated and Esmarch exsanguinated with a thigh   tourniquet elevated ot 365 mmHg.      Initially, through a 15-cm longitudinal incision based over the patella initial exposure was made.  The underlying subcutaneous tissues were incised along with the skin incision.  A median arthrotomy was performed revealing an  excessive amount of normal-appearing joint fluid,  The articular surfaces were inspected.  The patient had grade #4 changes medially, grade #4 changes laterally, and grade #4 changes in the patellofemoral joint.  Osteophytes were removed from the femoral condyles and the tibial plateau.  The medial and lateral meniscal remnants were removed as well as the anterior cruciate ligament.    Intramedullary drill was then used to drill up the femoral canal for placement of the distal femoral cutting jig.  It was placed in the appropriate  rotation and a distal  10 mm cut was made.  The distal femur was sized.  A #4 size femoral component was found to be the appropriate size.  A #4 cutting jig was placed in an appropriate amount of external rotation and then chamfer cuts were made.   The proximal tibia was exposed.  The tibial spines were removed with an oscillating saw.  Intramedullary drill was used to drill down the tibial canal for placement of the proximal tibial cutting jig.  It ws placed in the appropriate amount of rotation and a proximal 3 mm cut was made based off the lower side.  Spacer blocks were then placed in flexion and extension.  A 8 mm block gave excellent balancing, excellent stability, and excellent correction of the patients flexion and  deformity.  A #5 tibial baseplate trial was then placed on the cut tibial surface with a excellent fit.  The keel cut was  then made.   The PCL box cutter was then placed on the distal femur and these cuts were made.  At this point, the #4 femoral trial was placed and with a #5 tibial baseplate trial and a 8-mm polyethylene RP tibial spacer.  The knee was reduced, taken through range of motion from 0-125 with excellent stability and excellent correction of the flexion and varus deformity with normal patella tracking.    A resurfacing  8.35m cut was made on the undersurface of the patella and three locking holes placed for a  32-mm polyethylene patella trial and  again patellofemoral tracking was evaluated and found to be normal.  At this point, it was felt that all trial components were of excellent size, fit, and stability.  They were then removed.  The knee was irrigated copiously using jet lavage of 3 liters of sterile saline.    The proximal tibia was exposed.  A #5  tibial baseplate with zinacef  impregnated cement was hammered into position with a excellent fit.  Excess cement was removed from around the edges.    A #4 femoral component with cement backing was hammered into position.  This also had excellent fit.  Excess cement was removed from around the edges.   A  32-mm polyethylene  RP tibial spacer was placed in the tibial baseplate.  The knee was reduced and taken through a full range of motion  with excellent stability and excellent correction of the deformities.    The 8-mm polyethylene cement backed patella was placed in its position and held there with a clamp.  After the cement had hardened, patellofemoral tracking was evaluated and found to be normal.    At this point, I felt that all components were of excellent size and fit resulting in great stability.  The wound was further irrigated with saline.  Tourniquet was deflated.  Hemostasis was obtained with cautery.  The arthrotomy was then closed with #1Ethibond suture.  Subcutaneous tissues closed with 0 and 2-0 Vicryl.  The subcuticular layer was closed with a 3-0 Monocryl.  Sterile dressings were applied with a long leg knee immobilizer.   The patient was awakened, extubated and taken to the recovery room in stable condition.  Needle, instrument, and sponge counts were correct times 2 at the end of the case.  Neurovascular status was normal with distal pulses 2+ and symmetric.      Athira Janowicz A. WNoemi Chapel MD Orthopedic Surgeon [phone]  09/19/2017, 9:04 AM 09/19/2017  9:04 AM

## 2017-09-19 NOTE — Transfer of Care (Signed)
Immediate Anesthesia Transfer of Care Note  Patient: Barbara Hernandez  Procedure(s) Performed: TOTAL KNEE ARTHROPLASTY (Left )  Patient Location: PACU  Anesthesia Type:General and Regional  Level of Consciousness: awake, alert , oriented and patient cooperative  Airway & Oxygen Therapy: Patient Spontanous Breathing and Patient connected to nasal cannula oxygen  Post-op Assessment: Report given to RN and Post -op Vital signs reviewed and stable  Post vital signs: Reviewed and stable  Last Vitals:  Vitals Value Taken Time  BP    Temp    Pulse 88 09/19/2017  9:34 AM  Resp 15 09/19/2017  9:34 AM  SpO2 96 % 09/19/2017  9:34 AM  Vitals shown include unvalidated device data.  Last Pain:  Vitals:   09/19/17 0635  TempSrc: Oral  PainSc:          Complications: No apparent anesthesia complications

## 2017-09-19 NOTE — Progress Notes (Signed)
Orthopedic Tech Progress Note Patient Details:  Barbara Hernandez May 07, 1941 [id number]  CPM Left Knee CPM Left Knee: On Left Knee Flexion (Degrees): 90 Left Knee Extension (Degrees): 0 Additional Comments: foot roll  Post Interventions Patient Tolerated: Well Instructions Provided: Care of device, Adjustment of device  [name] 09/19/2017, 10:00 AM

## 2017-09-19 NOTE — Anesthesia Postprocedure Evaluation (Signed)
Anesthesia Post Note  Patient: Barbara Hernandez  Procedure(s) Performed: TOTAL KNEE ARTHROPLASTY (Left )     Patient location during evaluation: PACU Anesthesia Type: General Level of consciousness: awake Pain management: pain level controlled Vital Signs Assessment: post-procedure vital signs reviewed and stable Respiratory status: spontaneous breathing Cardiovascular status: stable Anesthetic complications: no    Last Vitals:  Vitals:   09/19/17 0636 09/19/17 0931  BP: (!) 155/64 (!) 149/69  Pulse:  88  Resp:  18  Temp:  (!) 36.4 C  SpO2:  95%    Last Pain:  Vitals:   09/19/17 0948  TempSrc:   PainSc: 4                  Jessenya Berdan

## 2017-09-19 NOTE — Anesthesia Procedure Notes (Signed)
Performed by: Izora Gala, CRNA

## 2017-09-19 NOTE — Evaluation (Signed)
Physical Therapy Evaluation Patient Details Name: Barbara Hernandez MRN: [id number] DOB: 08-18-1940 Today's Date: 09/19/2017   History of Present Illness  Pt is a 77 y/o female s/p elective L TKA. PMH includes HTN, R TKA, OSA on CPAP, and mitral incompetence.   Clinical Impression  Pt is s/p surgery above with deficits below. Pt requiring min to min guard A for mobility with RW this session. Reviewed supine HEP and knee precautions with pt. Will continue to follow acutely to maximize functional mobility independence and safety.     Follow Up Recommendations Follow surgeon's recommendation for DC plan and follow-up therapies;Supervision for mobility/OOB    Equipment Recommendations  None recommended by PT    Recommendations for Other Services       Precautions / Restrictions Precautions Precautions: Knee Precaution Booklet Issued: Yes (comment) Precaution Comments: Reviewed knee precautions and supine HEP with pt.  Required Braces or Orthoses: Knee Immobilizer - Left Knee Immobilizer - Left: Other (comment)(until discontinued ) Restrictions Weight Bearing Restrictions: Yes LLE Weight Bearing: Weight bearing as tolerated      Mobility  Bed Mobility Overal bed mobility: Needs Assistance Bed Mobility: Supine to Sit     Supine to sit: Min assist     General bed mobility comments: Min A for LLE management. Required use of bed rails and elevated HOB.   Transfers Overall transfer level: Needs assistance Equipment used: Rolling walker (2 wheeled) Transfers: Sit to/from [facility] Sit to Stand: Min assist Stand pivot transfers: Min guard;Min assist       General transfer comment: Min A for lift assist and steadying. Verbal cues for safe hand placement and sequencing to stand using L KI. Min guard to min A for steadying to perform stand pivot transfer to chair. REquired cues for sequencing using RW.   Ambulation/Gait             General Gait Details:  Pivotal steps to recliner this session  Stairs            Wheelchair Mobility    Modified Rankin (Stroke Patients Only)       Balance Overall balance assessment: Needs assistance Sitting-balance support: No upper extremity supported;Feet supported Sitting balance-Leahy Scale: Good     Standing balance support: Bilateral upper extremity supported;During functional activity Standing balance-Leahy Scale: Poor Standing balance comment: Reliant on BUE support                              Pertinent Vitals/Pain Pain Assessment: 0-10 Pain Score: 2  Pain Location: L knee  Pain Descriptors / Indicators: Aching;Operative site guarding Pain Intervention(s): Limited activity within patient's tolerance;Monitored during session;Repositioned    Home Living Family/patient expects to be discharged to:: Private residence Living Arrangements: Alone Available Help at Discharge: Family;Available 24 hours/day(daughter will be staying with you ) Type of Home: House Home Access: Stairs to enter Entrance Stairs-Rails: Right Entrance Stairs-Number of Steps: 4 Home Layout: One level Home Equipment: Grab bars - tub/shower;Bedside commode;Walker - 2 wheels      Prior Function Level of Independence: Independent               Hand Dominance   Dominant Hand: Left    Extremity/Trunk Assessment   Upper Extremity Assessment Upper Extremity Assessment: Defer to OT evaluation    Lower Extremity Assessment Lower Extremity Assessment: LLE deficits/detail LLE Deficits / Details: Sensory in tact. Deficits consistent with post op pain and weakness. Able  to perform ther ex below.     Cervical / Trunk Assessment Cervical / Trunk Assessment: Other exceptions Cervical / Trunk Exceptions: Reports she has been having sacral pain that she is seeing PT for. Reports it occurs when she ambulates.   Communication   Communication: No difficulties  Cognition Arousal/Alertness:  Awake/alert Behavior During Therapy: WFL for tasks assessed/performed Overall Cognitive Status: Within Functional Limits for tasks assessed                                        General Comments      Exercises Total Joint Exercises Ankle Circles/Pumps: AROM;Both;20 reps Quad Sets: AROM;Left;10 reps Towel Squeeze: AROM;Both;10 reps Heel Slides: AROM;Left;10 reps   Assessment/Plan    PT Assessment Patient needs continued PT services  PT Problem List Decreased strength;Decreased range of motion;Decreased balance;Decreased mobility;Decreased coordination;Decreased knowledge of use of DME;Decreased knowledge of precautions;Pain       PT Treatment Interventions DME instruction;Gait training;Stair training;Functional mobility training;Therapeutic activities;Therapeutic exercise;Balance training;Neuromuscular re-education;Patient/family education    PT Goals (Current goals can be found in the Care Plan section)  Acute Rehab PT Goals Patient Stated Goal: to go home  PT Goal Formulation: With patient Time For Goal Achievement: 10/03/17 Potential to Achieve Goals: Good    Frequency 7X/week   Barriers to discharge        Co-evaluation               AM-PAC PT "6 Clicks" Daily Activity  Outcome Measure Difficulty turning over in bed (including adjusting bedclothes, sheets and blankets)?: A Little Difficulty moving from lying on back to sitting on the side of the bed? : Unable Difficulty sitting down on and standing up from a chair with arms (e.g., wheelchair, bedside commode, etc,.)?: Unable Help needed moving to and from a bed to chair (including a wheelchair)?: A Little Help needed walking in hospital room?: A Little Help needed climbing 3-5 steps with a railing? : A Lot 6 Click Score: 13    End of Session Equipment Utilized During Treatment: Gait belt;Left knee immobilizer Activity Tolerance: Patient tolerated treatment well Patient left: in chair;with  call bell/phone within reach Nurse Communication: Mobility status PT Visit Diagnosis: Unsteadiness on feet (R26.81);Other abnormalities of gait and mobility (R26.89);Pain Pain - Right/Left: Left Pain - part of body: Knee    Time: 4315-4008 PT Time Calculation (min) (ACUTE ONLY): 28 min   Charges:   PT Evaluation $PT Eval Low Complexity: 1 Low PT Treatments $Therapeutic Activity: 8-22 mins   PT G Codes:        Leighton Ruff, PT, DPT  Acute Rehabilitation Services  Pager: [phone]   Rudean Hitt 09/19/2017, 4:53 PM

## 2017-09-19 NOTE — Anesthesia Procedure Notes (Signed)
Anesthesia Regional Block: Adductor canal block   Pre-Anesthetic Checklist: ,, timeout performed, Correct Patient, Correct Site, Correct Laterality, Correct Procedure, Correct Position, site marked, Risks and benefits discussed,  Surgical consent,  Pre-op evaluation,  At surgeon's request and post-op pain management  Laterality: Left  Prep: chloraprep       Needles:   Needle Type: Stimulator Needle - 80          Additional Needles:   Procedures: Doppler guided,,,, ultrasound used (permanent image in chart),,,,  Narrative:  Start time: 09/19/2017 6:45 AM End time: 09/19/2017 7:00 AM Injection made incrementally with aspirations every 5 mL.  Performed by: Personally  Anesthesiologist: Belinda Block, MD

## 2017-09-19 NOTE — Anesthesia Procedure Notes (Signed)
Procedure Name: Intubation Date/Time: 09/19/2017 7:29 AM Performed by: Izora Gala, CRNA Pre-anesthesia Checklist: Patient identified, Emergency Drugs available, Suction available and Patient being monitored Patient Re-evaluated:Patient Re-evaluated prior to induction Oxygen Delivery Method: Circle system utilized Preoxygenation: Pre-oxygenation with 100% oxygen Induction Type: IV induction Ventilation: Mask ventilation without difficulty Laryngoscope Size: Miller Grade View: Grade I Tube type: Oral Tube size: 7.0 mm Number of attempts: 1 Airway Equipment and Method: Stylet Placement Confirmation: ETT inserted through vocal cords under direct vision,  positive ETCO2 and breath sounds checked- equal and bilateral Secured at: 22 cm Tube secured with: Tape Dental Injury: Teeth and Oropharynx as per pre-operative assessment

## 2017-09-19 NOTE — Progress Notes (Signed)
RT set up patient CPAP on 9. Patient has home mask. NO O2 bleed in needed. Patient is able to place herself on when she is ready. Patient needs no assistance with CPAP.

## 2017-09-19 NOTE — Anesthesia Preprocedure Evaluation (Addendum)
Anesthesia Evaluation  Patient identified by MRN, date of birth, ID band Patient awake    Reviewed: Allergy & Precautions, NPO status , Patient's Chart, lab work & pertinent test results  Airway Mallampati: II  TM Distance: >3 FB     Dental   Pulmonary sleep apnea , former smoker,    breath sounds clear to auscultation       Cardiovascular hypertension, + Peripheral Vascular Disease   Rhythm:Regular Rate:Normal     Neuro/Psych    GI/Hepatic Neg liver ROS, GERD  ,  Endo/Other  Hypothyroidism   Renal/GU negative Renal ROS     Musculoskeletal  (+) Arthritis ,   Abdominal   Peds  Hematology  (+) anemia ,   Anesthesia Other Findings   Reproductive/Obstetrics                             Anesthesia Physical Anesthesia Plan  ASA: III  Anesthesia Plan: Spinal and General   Post-op Pain Management:  Regional for Post-op pain   Induction: Intravenous  PONV Risk Score and Plan: Treatment may vary due to age or medical condition, Ondansetron and Midazolam  Airway Management Planned: Nasal Cannula, Simple Face Mask and Oral ETT  Additional Equipment:   Intra-op Plan:   Post-operative Plan: Extubation in OR  Informed Consent: I have reviewed the patients History and Physical, chart, labs and discussed the procedure including the risks, benefits and alternatives for the proposed anesthesia with the patient or authorized representative who has indicated his/her understanding and acceptance.   Dental advisory given  Plan Discussed with: CRNA and Anesthesiologist  Anesthesia Plan Comments:       Anesthesia Quick Evaluation

## 2017-09-20 ENCOUNTER — Encounter (HOSPITAL_COMMUNITY): Payer: Self-pay | Admitting: Orthopedic Surgery

## 2017-09-20 DIAGNOSIS — K5720 Diverticulitis of large intestine with perforation and abscess without bleeding: Secondary | ICD-10-CM | POA: Diagnosis not present

## 2017-09-20 DIAGNOSIS — K631 Perforation of intestine (nontraumatic): Secondary | ICD-10-CM | POA: Diagnosis not present

## 2017-09-20 LAB — BASIC METABOLIC PANEL
ANION GAP: 9 (ref 5–15)
BUN: 12 mg/dL (ref 6–20)
CALCIUM: 8.5 mg/dL — AB (ref 8.9–10.3)
CO2: 23 mmol/L (ref 22–32)
Chloride: 99 mmol/L — ABNORMAL LOW (ref 101–111)
Creatinine, Ser: 0.67 mg/dL (ref 0.44–1.00)
GFR calc non Af Amer: >60 mL/min (ref >60)
GLUCOSE: 134 mg/dL — AB (ref 65–99)
POTASSIUM: 4.6 mmol/L (ref 3.5–5.1)
Sodium: 131 mmol/L — ABNORMAL LOW (ref 135–145)

## 2017-09-20 LAB — CBC
HCT: 34.3 % — ABNORMAL LOW (ref 36.0–46.0)
Hemoglobin: 11 g/dL — ABNORMAL LOW (ref 12.0–15.0)
MCH: 28.7 pg (ref 26.0–34.0)
MCHC: 32.1 g/dL (ref 30.0–36.0)
MCV: 89.6 fL (ref 78.0–100.0)
Platelets: 234 10*3/uL (ref 150–400)
RBC: 3.83 MIL/uL — AB (ref 3.87–5.11)
RDW: 13.6 % (ref 11.5–15.5)
WBC: 11 10*3/uL — AB (ref 4.0–10.5)

## 2017-09-20 MED ORDER — OXYCODONE HCL 5 MG PO TABS: ORAL_TABLET | ORAL | 0 refills | Status: DC

## 2017-09-20 MED ORDER — POLYETHYLENE GLYCOL 3350 17 G PO PACK: PACK | ORAL | 0 refills | Status: DC

## 2017-09-20 MED ORDER — DOCUSATE SODIUM 100 MG PO CAPS: ORAL_CAPSULE | ORAL | 0 refills | Status: DC

## 2017-09-20 MED ORDER — GABAPENTIN 300 MG PO CAPS: ORAL_CAPSULE | ORAL | 2 refills | Status: DC

## 2017-09-20 MED ORDER — ASPIRIN 325 MG PO TBEC: DELAYED_RELEASE_TABLET | ORAL | 0 refills | Status: DC

## 2017-09-20 NOTE — Discharge Summary (Signed)
Patient ID: Barbara Hernandez MRN: [id number] DOB/AGE: [birth year] 77 y.o.  Admit date: 09/19/2017 Discharge date: 09/20/2017  Admission Diagnoses:  Principal Problem:   Primary localized osteoarthritis of left knee Active Problems:   Esophageal reflux   Hypothyroidism   Barrett's esophagus   Hypercholesteremia   Hypertension   Pernicious anemia   Systolic murmur   Sleep apnea   MI (mitral incompetence)   S/P total knee replacement, right   Discharge Diagnoses:  Same  Past Medical History:  Diagnosis Date  . Anemia   . Arthritis   . Barrett's esophagus 2012   2010 upper endoscopy showed only reflux. 2012 biopsies suggested Barrett's epithelial changes.  . Bladder tumor   . BPPV (benign paroxysmal positional vertigo) 2004  . Family history of adverse reaction to anesthesia    sister had memory problems after delivery of child  . GERD (gastroesophageal reflux disease)   . H/O measles   . Heart murmur   . Hematuria   . Hematuria   . Hemorrhoid   . Hemorrhoids   . History of chicken pox   . Hyperlipidemia   . Hypertension   . Hypothyroidism   . Joint pain   . Mild left ventricular hypertrophy   . Onychomycosis   . Primary localized osteoarthritis of left knee 09/06/2017  . Reflux   . Reflux esophagitis   . 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 Sleep apnea 2011   Uses C-Pap machine  . Thyroid disease     Surgeries: Procedure(s): TOTAL KNEE ARTHROPLASTY on 09/19/2017   Consultants:   Discharged Condition: Improved  Hospital Course: Barbara Hernandez is an 77 y.o. female who was admitted 09/19/2017 for operative treatment ofPrimary localized osteoarthritis of left knee. Patient has severe unremitting pain that affects sleep, daily activities, and work/hobbies. After pre-op clearance the patient was taken to the operating room on 09/19/2017 and underwent  Procedure(s): TOTAL KNEE ARTHROPLASTY.    Patient was given perioperative antibiotics:  Anti-infectives  (From admission, onward)   Start     Dose/Rate Route Frequency Ordered Stop   09/19/17 1500  ceFAZolin (ANCEF) IVPB 2g/100 mL premix     2 g 200 mL/hr over 30 Minutes Intravenous Every 6 hours 09/19/17 1407 09/19/17 2117   09/19/17 0836  cefUROXime (ZINACEF) injection  Status:  Discontinued       As needed 09/19/17 0836 09/19/17 0928   09/19/17 0556  ceFAZolin (ANCEF) IVPB 2g/100 mL premix     2 g 200 mL/hr over 30 Minutes Intravenous On call to O.R. 09/19/17 5366 09/19/17 4403       Patient was given sequential compression devices, early ambulation, and chemoprophylaxis to prevent DVT.  Patient benefited maximally from hospital stay and there were no complications.    Recent vital signs:  Patient Vitals for the past 24 hrs:  BP Temp Temp src Pulse Resp SpO2  09/20/17 0600 124/77 97.9 F (36.6 C) Oral 92 18 95 %  09/20/17 0015 (!) 148/72 97.8 F (36.6 C) Oral 90 18 95 %  09/19/17 2258 - - - 97 18 96 %  09/19/17 2100 (!) 159/70 97.7 F (36.5 C) Oral 97 18 95 %  09/19/17 1416 (!) 150/90 (!) 97.4 F (36.3 C) Oral (!) 107 18 94 %  09/19/17 1330 - - - (!) 104 15 93 %  09/19/17 1300 - - - 100 12 94 %  09/19/17 1245 - - - 95 (!) 8 94 %     Recent laboratory  studies:  Recent Labs    09/20/17 0526  WBC 11.0*  HGB 11.0*  HCT 34.3*  PLT 234  NA 131*  K 4.6  CL 99*  CO2 23  BUN 12  CREATININE 0.67  GLUCOSE 134*  CALCIUM 8.5*     Discharge Medications:   Allergies as of 09/20/2017      Reactions   Carvedilol Other (See Comments)   No energy, fatigued    Hctz [hydrochlorothiazide] Other (See Comments)   Abnormal labs      Medication List    STOP taking these medications   celecoxib 200 MG capsule Commonly known as:  CELEBREX   Docusate Sodium 100 MG capsule Replaced by:  docusate sodium 100 MG capsule     TAKE these medications   acetaminophen 500 MG tablet Commonly known as:  TYLENOL Take 500 mg by mouth every 6 (six) hours as needed for mild pain.    aspirin 325 MG EC tablet 1 tab a day for the next 30 days to prevent blood clots What changed:    medication strength  how much to take  how to take this  when to take this  additional instructions   CALCIUM 1200 PO Take 1 tablet by mouth daily.   carboxymethylcellulose 0.5 % Soln Commonly known as:  REFRESH PLUS Place 1 drop into both eyes every morning.   docusate sodium 100 MG capsule Commonly known as:  COLACE 1 tab 2 times a day while on narcotics.  STOOL SOFTENER Replaces:  Docusate Sodium 100 MG capsule   gabapentin 300 MG capsule Commonly known as:  NEURONTIN 1 tablet at night for nerve pain   GENTEAL TEARS OP Apply 1 application to eye at bedtime.   levothyroxine 125 MCG tablet Commonly known as:  SYNTHROID, LEVOTHROID Take 1 tablet (125 mcg total) by mouth daily.   lisinopril 40 MG tablet Commonly known as:  PRINIVIL,ZESTRIL Take 1 tablet (40 mg total) by mouth daily. bedtime   omeprazole 20 MG capsule Commonly known as:  PRILOSEC Take 1 capsule (20 mg total) by mouth daily.   ondansetron 4 MG tablet Commonly known as:  ZOFRAN Take 1 tablet (4 mg total) by mouth every 6 (six) hours as needed for nausea.   oxyCODONE 5 MG immediate release tablet Commonly known as:  Oxy IR/ROXICODONE 1 po q 4 hrs prn pain.  Patient is s/p left total knee replacement on  09/20/17   polyethylene glycol packet Commonly known as:  MIRALAX / GLYCOLAX Not pertinent to current symptomatology therefore not examined.   simvastatin 20 MG tablet Commonly known as:  ZOCOR Take 1 tablet (20 mg total) by mouth daily. bedtime What changed:    when to take this  additional instructions   spironolactone 25 MG tablet Commonly known as:  ALDACTONE Take 25 mg by mouth daily.   vitamin B-12 100 MCG tablet Commonly known as:  CYANOCOBALAMIN Take 100 mcg by mouth daily.   Vitamin D3 2000 units Tabs Take 1 tablet by mouth every evening. bedtime            Discharge  Care Instructions  (From admission, onward)        Start     Ordered   09/20/17 0000  Change dressing    Comments:  DO NOT REMOVE BANDAGE OVER SURGICAL INCISION.  [place] WHOLE LEG INCLUDING OVER THE WATERPROOF BANDAGE WITH SOAP AND WATER EVERY DAY.   09/20/17 1044      Diagnostic Studies: No results found.  Disposition: Discharge disposition: 01-Home or Self Care       Discharge Instructions    CPM   Complete by:  As directed    Continuous passive motion machine (CPM):      Use the CPM from 0 to 90 for 6 hours per day.       You may break it up into 2 or 3 sessions per day.      Use CPM for 2 weeks or until you are told to stop.   Call MD / Call 911   Complete by:  As directed    If you experience chest pain or shortness of breath, CALL 911 and be transported to the hospital emergency room.  If you develope a fever above 101 F, pus (white drainage) or increased drainage or redness at the wound, or calf pain, call your surgeon's office.   Change dressing   Complete by:  As directed    DO NOT REMOVE BANDAGE OVER SURGICAL INCISION.  [place] WHOLE LEG INCLUDING OVER THE WATERPROOF BANDAGE WITH SOAP AND WATER EVERY DAY.   Constipation Prevention   Complete by:  As directed    Drink plenty of fluids.  Prune juice may be helpful.  You may use a stool softener, such as Colace (over the counter) 100 mg twice a day.  Use MiraLax (over the counter) for constipation as needed.   Diet - low sodium heart healthy   Complete by:  As directed    Discharge instructions   Complete by:  As directed    INSTRUCTIONS AFTER JOINT REPLACEMENT   Remove items at home which could result in a fall. This includes throw rugs or furniture in walking pathways ICE to the affected joint every three hours while awake for 30 minutes at a time, for at least the first 3-5 days, and then as needed for pain and swelling.  Continue to use ice for pain and swelling. You may notice swelling that will progress down to the  foot and ankle.  This is normal after surgery.  Elevate your leg when you are not up walking on it.   Continue to use the breathing machine you got in the hospital (incentive spirometer) which will help keep your temperature down.  It is common for your temperature to cycle up and down following surgery, especially at night when you are not up moving around and exerting yourself.  The breathing machine keeps your lungs expanded and your temperature down.   DIET:  As you were doing prior to hospitalization, we recommend a well-balanced diet.  DRESSING / WOUND CARE / SHOWERING  Keep the surgical dressing until follow up.  The dressing is water proof, so you can shower without any extra covering.  IF THE DRESSING FALLS OFF or the wound gets wet inside, change the dressing with sterile gauze.  Please use good hand washing techniques before changing the dressing.  Do not use any lotions or creams on the incision until instructed by your surgeon.    ACTIVITY  Increase activity slowly as tolerated, but follow the weight bearing instructions below.   No driving for 6 weeks or until further direction given by your physician.  You cannot drive while taking narcotics.  No lifting or carrying greater than 10 lbs. until further directed by your surgeon. Avoid periods of inactivity such as sitting longer than an hour when not asleep. This helps prevent blood clots.  You may return to work once you are authorized by your  doctor.     WEIGHT BEARING   Weight bearing as tolerated with assist device (walker, cane, etc) as directed, use it as long as suggested by your surgeon or therapist, typically at least 2-3 weeks.   EXERCISES  Results after joint replacement surgery are often greatly improved when you follow the exercise, range of motion and muscle strengthening exercises prescribed by your doctor. Safety measures are also important to protect the joint from further injury. Any time any of these  exercises cause you to have increased pain or swelling, decrease what you are doing until you are comfortable again and then slowly increase them. If you have problems or questions, call your caregiver or physical therapist for advice.   Rehabilitation is important following a joint replacement. After just a few days of immobilization, the muscles of the leg can become weakened and shrink (atrophy).  These exercises are designed to build up the tone and strength of the thigh and leg muscles and to improve motion. Often times heat used for twenty to thirty minutes before working out will loosen up your tissues and help with improving the range of motion but do not use heat for the first two weeks following surgery (sometimes heat can increase post-operative swelling).   These exercises can be done on a training (exercise) mat, on the floor, on a table or on a bed. Use whatever works the best and is most comfortable for you.    Use music or television while you are exercising so that the exercises are a pleasant break in your day. This will make your life better with the exercises acting as a break in your routine that you can look forward to.   Perform all exercises about fifteen times, three times per day or as directed.  You should exercise both the operative leg and the other leg as well.   Exercises include:  Quad Sets - Tighten up the muscle on the front of the thigh (Quad) and hold for 5-10 seconds.   Straight Leg Raises - With your knee straight (if you were given a brace, keep it on), lift the leg to 60 degrees, hold for 3 seconds, and slowly lower the leg.  Perform this exercise against resistance later as your leg gets stronger.  Leg Slides: Lying on your back, slowly slide your foot toward your buttocks, bending your knee up off the floor (only go as far as is comfortable). Then slowly slide your foot back down until your leg is flat on the floor again.  Angel Wings: Lying on your back spread  your legs to the side as far apart as you can without causing discomfort.  Hamstring Strength:  Lying on your back, push your heel against the floor with your leg straight by tightening up the muscles of your buttocks.  Repeat, but this time bend your knee to a comfortable angle, and push your heel against the floor.  You may put a pillow under the heel to make it more comfortable if necessary.   A rehabilitation program following joint replacement surgery can speed recovery and prevent re-injury in the future due to weakened muscles. Contact your doctor or a physical therapist for more information on knee rehabilitation.    CONSTIPATION  Constipation is defined medically as fewer than three stools per week and severe constipation as less than one stool per week.  Even if you have a regular bowel pattern at home, your normal regimen is likely to be disrupted due to  multiple reasons following surgery.  Combination of anesthesia, postoperative narcotics, change in appetite and fluid intake all can affect your bowels.   YOU MUST use at least one of the following options; they are listed in order of increasing strength to get the job done.  They are all available over the counter, and you may need to use some, POSSIBLY even all of these options:    Drink plenty of fluids (prune juice may be helpful) and high fiber foods Colace 100 mg by mouth twice a day  Senokot for constipation as directed and as needed Dulcolax (bisacodyl), take with full glass of water  Miralax (polyethylene glycol) once or twice a day as needed.  If you have tried all these things and are unable to have a bowel movement in the first 3-4 days after surgery call either your surgeon or your primary doctor.    If you experience loose stools or diarrhea, hold the medications until you stool forms back up.  If your symptoms do not get better within 1 week or if they get worse, check with your doctor.  If you experience "the worst  abdominal pain ever" or develop nausea or vomiting, please contact the office immediately for further recommendations for treatment.   ITCHING:  If you experience itching with your medications, try taking only a single pain pill, or even half a pain pill at a time.  You can also use Benadryl over the counter for itching or also to help with sleep.   TED HOSE STOCKINGS:  Use stockings on both legs until for at least 2 weeks or as directed by physician office. They may be removed at night for sleeping.  MEDICATIONS:  See your medication summary on the "After Visit Summary" that nursing will review with you.  You may have some home medications which will be placed on hold until you complete the course of blood thinner medication.  It is important for you to complete the blood thinner medication as prescribed.  PRECAUTIONS:  If you experience chest pain or shortness of breath - call 911 immediately for transfer to the hospital emergency department.   If you develop a fever greater that 101 F, purulent drainage from wound, increased redness or drainage from wound, foul odor from the wound/dressing, or calf pain - CONTACT YOUR SURGEON.                                                   FOLLOW-UP APPOINTMENTS:  If you do not already have a post-op appointment, please call the office for an appointment to be seen by your surgeon.  Guidelines for how soon to be seen are listed in your "After Visit Summary", but are typically between 1-4 weeks after surgery.  OTHER INSTRUCTIONS:   Knee Replacement:  Do not place pillow under knee, focus on keeping the knee straight while resting. CPM instructions: 0-90 degrees, 2 hours in the morning, 2 hours in the afternoon, and 2 hours in the evening. Place foam block, curve side up under heel at all times except when in CPM or when walking.  DO NOT modify, tear, cut, or change the foam block in any way.  MAKE SURE YOU:  Understand these instructions.  Get help right  away if you are not doing well or get worse.    Thank you for  letting us be a part of your medical care team.  It is a privilege we respect greatly.  We hope these instructions will help you stay on track for a fast and full recovery!   Do not put a pillow under the knee. Place it under the heel.   Complete by:  As directed    Place gray foam block, curve side up under heel at all times except when in CPM or when walking.  DO NOT modify, tear, cut, or change in any way the gray foam block.   Increase activity slowly as tolerated   Complete by:  As directed    Patient may shower   Complete by:  As directed    Aquacel dressing is water proof    Wash over it and the whole leg with soap and water at the end of your shower   TED hose   Complete by:  As directed    Use stockings (TED hose) for 2 weeks on both leg(s).  You may remove them at night for sleeping.      Follow-up Information    Elsie Saas, MD Follow up on 10/03/2017.   Specialty:  Orthopedic Surgery Why:  appointment time 4 pm Contact information: [place] [address] [phone]        [facility] Follow up on 10/04/2017.   Why:  Arrive at 1:45 for a 2 pm appointment with Charolotte Eke information: [facility] [address] [place], [place] [address] [phone]           Signed: Linda Hedges 09/20/2017, 10:56 AM

## 2017-09-20 NOTE — Care Management Note (Signed)
Case Management Note  Patient Details  Name: ZOPHIA MARRONE MRN: [id number] Date of Birth: 14-Mar-1941  Subjective/Objective:  77 yr old female s/p left total knee arthroplasty.                  Action/Plan: Case manager spoke with patient and daughter concerning discharge plan. Patient was preoperatively setup with [facility], no changes. She will have family support at discharge.    Expected Discharge Date:  09/20/17               Expected Discharge Plan:  [place]  In-House Referral:     Discharge planning Services  CM Consult  Post Acute Care Choice:  Durable Medical Equipment, Home Health Choice offered to:  Patient, Adult Children  DME Arranged:  3-N-1, CPM, Walker rolling DME Agency:  [employer]  HH Arranged:  PT [place]:  [facility] [employer]  Status of Service:  Completed, signed off  If discussed at [employer], dates discussed:    Additional Comments:  Ninfa Meeker, RN 09/20/2017, 1:21 PM

## 2017-09-20 NOTE — Progress Notes (Signed)
Physical Therapy Treatment Patient Details Name: Barbara Hernandez MRN: [id number] DOB: 03-Jul-1940 Today's Date: 09/20/2017    History of Present Illness Pt is a 77 y/o female s/p elective L TKA. PMH includes HTN, R TKA, OSA on CPAP, and mitral incompetence.     PT Comments    Patient is making good progress with PT.  From a mobility standpoint anticipate patient will be ready for DC home when medically ready.      Follow Up Recommendations  Follow surgeon's recommendation for DC plan and follow-up therapies;Supervision for mobility/OOB     Equipment Recommendations  None recommended by PT    Recommendations for Other Services       Precautions / Restrictions Precautions Precautions: Knee Precaution Comments: Reviewed knee precautions  Restrictions Weight Bearing Restrictions: Yes LLE Weight Bearing: Weight bearing as tolerated    Mobility  Bed Mobility               General bed mobility comments: pt OOB in chair upon arrival  Transfers Overall transfer level: Modified independent Equipment used: Rolling walker (2 wheeled) Transfers: Sit to/from Stand           General transfer comment: use of RW upon standing  Ambulation/Gait Ambulation/Gait assistance: Supervision Ambulation Distance (Feet): 200 Feet Assistive device: Rolling walker (2 wheeled) Gait Pattern/deviations: Step-through pattern     General Gait Details: steady gait and goot step through    Stairs Stairs: Yes   Stair Management: One rail Right;Step to pattern;Forwards Number of Stairs: 4 General stair comments: cues for sequencing and technique  Wheelchair Mobility    Modified Rankin (Stroke Patients Only)       Balance Overall balance assessment: Needs assistance Sitting-balance support: No upper extremity supported;Feet supported Sitting balance-Leahy Scale: Good     Standing balance support: Bilateral upper extremity supported;During functional activity Standing  balance-Leahy Scale: Poor                              Cognition Arousal/Alertness: Awake/alert Behavior During Therapy: WFL for tasks assessed/performed Overall Cognitive Status: Within Functional Limits for tasks assessed                                        Exercises Total Joint Exercises Short Arc Quad: AROM;Left;10 reps Hip ABduction/ADduction: AROM;Left;10 reps Straight Leg Raises: AROM;Left;10 reps Long Arc Quad: AROM;Left;10 reps Knee Flexion: AROM;Left;5 reps;Other (comment)(10 sec holds) Goniometric ROM: 0-92    General Comments        Pertinent Vitals/Pain Pain Assessment: Faces Faces Pain Scale: Hurts a little bit Pain Location: L thigh Pain Descriptors / Indicators: Discomfort;Sore Pain Intervention(s): Monitored during session;Premedicated before session;Repositioned    Home Living                      Prior Function            PT Goals (current goals can now be found in the care plan section) Acute Rehab PT Goals Patient Stated Goal: to go home  PT Goal Formulation: With patient Time For Goal Achievement: 10/03/17 Potential to Achieve Goals: Good Progress towards PT goals: Progressing toward goals    Frequency    7X/week      PT Plan Current plan remains appropriate    Co-evaluation  AM-PAC PT "6 Clicks" Daily Activity  Outcome Measure  Difficulty turning over in bed (including adjusting bedclothes, sheets and blankets)?: A Little Difficulty moving from lying on back to sitting on the side of the bed? : A Little Difficulty sitting down on and standing up from a chair with arms (e.g., wheelchair, bedside commode, etc,.)?: A Little Help needed moving to and from a bed to chair (including a wheelchair)?: A Little Help needed walking in hospital room?: A Little Help needed climbing 3-5 steps with a railing? : A Little 6 Click Score: 18    End of Session Equipment Utilized During  Treatment: Gait belt Activity Tolerance: Patient tolerated treatment well Patient left: in chair;with call bell/phone within reach;with family/visitor present Nurse Communication: Mobility status PT Visit Diagnosis: Unsteadiness on feet (R26.81);Other abnormalities of gait and mobility (R26.89);Pain Pain - Right/Left: Left Pain - part of body: Knee     Time: 3729-0211 PT Time Calculation (min) (ACUTE ONLY): 20 min  Charges:  $Gait Training: 8-22 mins                    G Codes:       Earney Navy, PTA Pager: [phone]     Darliss Cheney 09/20/2017, 11:14 AM

## 2017-09-23 ENCOUNTER — Ambulatory Visit (HOSPITAL_COMMUNITY)
Admission: RE | Admit: 2017-09-23 | Discharge: 2017-09-23 | Disposition: A | Discharge status: Home / Self Care | Payer: Medicare Other | Source: Ambulatory Visit | Attending: Physician Assistant | Admitting: Physician Assistant

## 2017-09-23 ENCOUNTER — Other Ambulatory Visit: Payer: Self-pay

## 2017-09-23 ENCOUNTER — Other Ambulatory Visit: Payer: Self-pay | Admitting: Physician Assistant

## 2017-09-23 ENCOUNTER — Inpatient Hospital Stay (HOSPITAL_COMMUNITY)
Admission: EM | Admit: 2017-09-23 | Discharge: 2017-09-28 | DRG: 392 | Disposition: A | Discharge status: Home Health | Payer: Medicare Other | Attending: General Surgery | Admitting: General Surgery

## 2017-09-23 ENCOUNTER — Encounter (HOSPITAL_COMMUNITY): Payer: Self-pay

## 2017-09-23 ENCOUNTER — Other Ambulatory Visit (HOSPITAL_COMMUNITY): Payer: Self-pay | Admitting: Specialist

## 2017-09-23 DIAGNOSIS — Z8249 Family history of ischemic heart disease and other diseases of the circulatory system: Secondary | ICD-10-CM

## 2017-09-23 DIAGNOSIS — Z8349 Family history of other endocrine, nutritional and metabolic diseases: Secondary | ICD-10-CM | POA: Diagnosis not present

## 2017-09-23 DIAGNOSIS — E039 Hypothyroidism, unspecified: Secondary | ICD-10-CM | POA: Diagnosis not present

## 2017-09-23 DIAGNOSIS — Z820 Family history of epilepsy and other diseases of the nervous system: Secondary | ICD-10-CM | POA: Diagnosis not present

## 2017-09-23 DIAGNOSIS — R079 Chest pain, unspecified: Secondary | ICD-10-CM | POA: Diagnosis present

## 2017-09-23 DIAGNOSIS — K631 Perforation of intestine (nontraumatic): Secondary | ICD-10-CM | POA: Diagnosis present

## 2017-09-23 DIAGNOSIS — D510 Vitamin B12 deficiency anemia due to intrinsic factor deficiency: Secondary | ICD-10-CM | POA: Diagnosis present

## 2017-09-23 DIAGNOSIS — Z825 Family history of asthma and other chronic lower respiratory diseases: Secondary | ICD-10-CM

## 2017-09-23 DIAGNOSIS — K5720 Diverticulitis of large intestine with perforation and abscess without bleeding: Secondary | ICD-10-CM

## 2017-09-23 DIAGNOSIS — R140 Abdominal distension (gaseous): Secondary | ICD-10-CM

## 2017-09-23 DIAGNOSIS — Z9989 Dependence on other enabling machines and devices: Secondary | ICD-10-CM | POA: Diagnosis not present

## 2017-09-23 DIAGNOSIS — Z96653 Presence of artificial knee joint, bilateral: Secondary | ICD-10-CM | POA: Diagnosis present

## 2017-09-23 DIAGNOSIS — Z833 Family history of diabetes mellitus: Secondary | ICD-10-CM

## 2017-09-23 DIAGNOSIS — Z818 Family history of other mental and behavioral disorders: Secondary | ICD-10-CM

## 2017-09-23 DIAGNOSIS — Z79899 Other long term (current) drug therapy: Secondary | ICD-10-CM | POA: Diagnosis not present

## 2017-09-23 DIAGNOSIS — G4733 Obstructive sleep apnea (adult) (pediatric): Secondary | ICD-10-CM | POA: Diagnosis present

## 2017-09-23 DIAGNOSIS — M19011 Primary osteoarthritis, right shoulder: Secondary | ICD-10-CM | POA: Diagnosis present

## 2017-09-23 DIAGNOSIS — Z8379 Family history of other diseases of the digestive system: Secondary | ICD-10-CM | POA: Diagnosis not present

## 2017-09-23 DIAGNOSIS — Z808 Family history of malignant neoplasm of other organs or systems: Secondary | ICD-10-CM | POA: Diagnosis not present

## 2017-09-23 DIAGNOSIS — Z8341 Family history of multiple endocrine neoplasia [MEN] syndrome: Secondary | ICD-10-CM

## 2017-09-23 DIAGNOSIS — I10 Essential (primary) hypertension: Secondary | ICD-10-CM | POA: Diagnosis present

## 2017-09-23 DIAGNOSIS — Z801 Family history of malignant neoplasm of trachea, bronchus and lung: Secondary | ICD-10-CM

## 2017-09-23 DIAGNOSIS — Z87891 Personal history of nicotine dependence: Secondary | ICD-10-CM

## 2017-09-23 DIAGNOSIS — K219 Gastro-esophageal reflux disease without esophagitis: Secondary | ICD-10-CM | POA: Diagnosis present

## 2017-09-23 DIAGNOSIS — E785 Hyperlipidemia, unspecified: Secondary | ICD-10-CM | POA: Diagnosis present

## 2017-09-23 DIAGNOSIS — Z888 Allergy status to other drugs, medicaments and biological substances status: Secondary | ICD-10-CM | POA: Diagnosis not present

## 2017-09-23 DIAGNOSIS — Z79890 Hormone replacement therapy (postmenopausal): Secondary | ICD-10-CM | POA: Diagnosis not present

## 2017-09-23 DIAGNOSIS — Z7982 Long term (current) use of aspirin: Secondary | ICD-10-CM | POA: Diagnosis not present

## 2017-09-23 DIAGNOSIS — Z803 Family history of malignant neoplasm of breast: Secondary | ICD-10-CM | POA: Diagnosis not present

## 2017-09-23 DIAGNOSIS — G4730 Sleep apnea, unspecified: Secondary | ICD-10-CM | POA: Diagnosis present

## 2017-09-23 DIAGNOSIS — Z906 Acquired absence of other parts of urinary tract: Secondary | ICD-10-CM | POA: Diagnosis not present

## 2017-09-23 DIAGNOSIS — K5780 Diverticulitis of intestine, part unspecified, with perforation and abscess without bleeding: Secondary | ICD-10-CM | POA: Diagnosis present

## 2017-09-23 DIAGNOSIS — M1990 Unspecified osteoarthritis, unspecified site: Secondary | ICD-10-CM | POA: Diagnosis present

## 2017-09-23 LAB — I-STAT CHEM 8, ED
BUN: 6 mg/dL (ref 6–20)
CHLORIDE: 97 mmol/L — AB (ref 101–111)
Calcium, Ion: 1.08 mmol/L — ABNORMAL LOW (ref 1.15–1.40)
Creatinine, Ser: 0.5 mg/dL (ref 0.44–1.00)
Glucose, Bld: 119 mg/dL — ABNORMAL HIGH (ref 65–99)
HCT: 31 % — ABNORMAL LOW (ref 36.0–46.0)
Hemoglobin: 10.5 g/dL — ABNORMAL LOW (ref 12.0–15.0)
Potassium: 4 mmol/L (ref 3.5–5.1)
SODIUM: 130 mmol/L — AB (ref 135–145)
TCO2: 24 mmol/L (ref 22–32)

## 2017-09-23 LAB — CBC WITH DIFFERENTIAL/PLATELET
Basophils Absolute: 0 10*3/uL (ref 0.0–0.1)
Basophils Relative: 0 %
EOS ABS: 0.2 10*3/uL (ref 0.0–0.7)
Eosinophils Relative: 1 %
HCT: 32 % — ABNORMAL LOW (ref 36.0–46.0)
HEMOGLOBIN: 10.5 g/dL — AB (ref 12.0–15.0)
LYMPHS ABS: 0.8 10*3/uL (ref 0.7–4.0)
Lymphocytes Relative: 5 %
MCH: 29.6 pg (ref 26.0–34.0)
MCHC: 32.8 g/dL (ref 30.0–36.0)
MCV: 90.1 fL (ref 78.0–100.0)
Monocytes Absolute: 0.6 10*3/uL (ref 0.1–1.0)
Monocytes Relative: 4 %
NEUTROS ABS: 15 10*3/uL — AB (ref 1.7–7.7)
NEUTROS PCT: 90 %
Platelets: 236 10*3/uL (ref 150–400)
RBC: 3.55 MIL/uL — AB (ref 3.87–5.11)
RDW: 14 % (ref 11.5–15.5)
WBC: 16.6 10*3/uL — AB (ref 4.0–10.5)

## 2017-09-23 MED ORDER — ONDANSETRON HCL 4 MG/2ML IJ SOLN
4.0000 mg | Freq: Once | INTRAMUSCULAR | Status: AC
  Administered 2017-09-23: 4 mg via INTRAVENOUS
  Filled 2017-09-23: qty 2

## 2017-09-23 MED ORDER — IOPAMIDOL (ISOVUE-300) INJECTION 61%
100.0000 mL | Freq: Once | INTRAVENOUS | Status: AC | PRN
  Administered 2017-09-23: 100 mL via INTRAVENOUS

## 2017-09-23 MED ORDER — IOPAMIDOL (ISOVUE-300) INJECTION 61%
INTRAVENOUS | Status: AC
  Filled 2017-09-23: qty 30

## 2017-09-23 MED ORDER — SODIUM CHLORIDE 0.9 % IJ SOLN
INTRAMUSCULAR | Status: AC
  Filled 2017-09-23: qty 50

## 2017-09-23 MED ORDER — IOPAMIDOL (ISOVUE-300) INJECTION 61%
30.0000 mL | Freq: Once | INTRAVENOUS | Status: AC | PRN
  Administered 2017-09-23: 30 mL via ORAL

## 2017-09-23 MED ORDER — SODIUM CHLORIDE 0.9 % IV BOLUS
500.0000 mL | Freq: Once | INTRAVENOUS | Status: AC
  Administered 2017-09-23: 500 mL via INTRAVENOUS

## 2017-09-23 MED ORDER — PIPERACILLIN-TAZOBACTAM 3.375 G IVPB 30 MIN
3.3750 g | Freq: Once | INTRAVENOUS | Status: AC
  Administered 2017-09-23: 3.375 g via INTRAVENOUS
  Filled 2017-09-23: qty 50

## 2017-09-23 MED ORDER — IOPAMIDOL (ISOVUE-300) INJECTION 61%
INTRAVENOUS | Status: AC
  Filled 2017-09-23: qty 100

## 2017-09-23 MED ORDER — FENTANYL CITRATE (PF) 100 MCG/2ML IJ SOLN
50.0000 ug | Freq: Once | INTRAMUSCULAR | Status: AC
  Administered 2017-09-23: 50 ug via INTRAVENOUS
  Filled 2017-09-23: qty 2

## 2017-09-23 NOTE — ED Notes (Signed)
Consult to general surgery @22 :42pm.

## 2017-09-23 NOTE — ED Provider Notes (Signed)
[facility] DEPT Provider Note   CSN: [id number] Arrival date & time: 09/23/17  2157     History   Chief Complaint Chief Complaint  Patient presents with  . Abdominal Pain    HPI Barbara Hernandez is a 77 y.o. female.  Patient is a 77 year old female who presents with abdominal pain.  She had a recent total knee replacement done by Dr. Para March 3 days ago.  She currently is at home.  She was discharged 2 days ago.  She states the knee is doing well.  She is able to ambulate on it and the swelling is starting to go down.  She states that yesterday after dinner she started having some pain in her abdomen.  She has had some increasing distention and worsening pain throughout today.  She had some nausea and a couple episodes of vomiting.  No known fevers.  No urinary symptoms.  No history of similar symptoms in the past.  The only prior abdominal surgery that she has had is a small tumor that was removed off her bladder.  She was evaluated by her orthopedic office who did a CT scan which shows free air, likely from a ruptured diverticula.     Past Medical History:  Diagnosis Date  . Anemia   . Arthritis   . Barrett's esophagus 2012   2010 upper endoscopy showed only reflux. 2012 biopsies suggested Barrett's epithelial changes.  . Bladder tumor   . BPPV (benign paroxysmal positional vertigo) 2004  . Family history of adverse reaction to anesthesia    sister had memory problems after delivery of child  . GERD (gastroesophageal reflux disease)   . H/O measles   . Heart murmur   . Hematuria   . Hematuria   . Hemorrhoid   . Hemorrhoids   . History of chicken pox   . Hyperlipidemia   . Hypertension   . Hypothyroidism   . Joint pain   . Mild left ventricular hypertrophy   . Onychomycosis   . Primary localized osteoarthritis of left knee 09/06/2017  . Reflux   . Reflux esophagitis   . 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 Sleep apnea 2011   Uses C-Pap machine  . Thyroid disease     Patient Active Problem List   Diagnosis Date Noted  . 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 Primary localized osteoarthritis of left knee 09/06/2017  . 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 Primary localized osteoarthritis of right knee 08/09/2016  . Primary localized osteoarthrosis of the knee, right 07/28/2016  . Benign essential HTN 03/18/2015  . 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 Arthritis, degenerative 11/22/2014  . VFD (visual field defect) 11/22/2014  . Hypothyroidism 10/08/2014  . Barrett's esophagus 10/08/2014  . Hypercholesteremia 10/08/2014  . Hypertension 10/08/2014  . Hematuria 10/08/2014  . Onychomycosis 10/08/2014  . Mild left ventricular hypertrophy 10/08/2014  . Pernicious anemia 10/08/2014  . Vitamin D deficiency 10/08/2014  . Vertigo, benign paroxysmal 10/08/2014  . Systolic murmur 01/74/9449  . Sleep apnea 10/08/2014  . Arthritis of shoulder region, right, degenerative 10/08/2014  . Allergic rhinitis 10/08/2014  . 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 MI (mitral incompetence) 08/14/2014  . Esophageal reflux 09/07/2012  . Hemorrhoid     Past Surgical History:  Procedure Laterality Date  . BLEPHAROPLASTY Bilateral 2015  . BREAST EXCISIONAL BIOPSY Left 1988   benign  . COLONOSCOPY W/ BIOPSIES N/A 01/16/2013   No source for GI blood loss, mild lymphatic prominence  in the rectum.  . ESOPHAGOGASTRODUODENOSCOPY (EGD) WITH PROPOFOL N/A 03/15/2016   Procedure: ESOPHAGOGASTRODUODENOSCOPY (EGD) WITH PROPOFOL;  Surgeon: Robert Bellow, MD;  Location: [facility];  Service: Endoscopy;  Laterality: N/A;  . EYE SURGERY Bilateral 2006   cataract  . Left wrist fracture    . POPLITEAL SYNOVIAL CYST EXCISION Right   . TOTAL KNEE ARTHROPLASTY Right 08/09/2016   Procedure: TOTAL KNEE ARTHROPLASTY;  Surgeon: Elsie Saas, MD;  Location: [place];  Service: Orthopedics;  Laterality: Right;  . TOTAL KNEE ARTHROPLASTY Left  09/19/2017   Procedure: TOTAL KNEE ARTHROPLASTY;  Surgeon: Elsie Saas, MD;  Location: [facility];  Service: Orthopedics;  Laterality: Left;  . TRANSURETHRAL RESECTION OF BLADDER TUMOR N/A 11/20/2014   Procedure: TRANSURETHRAL RESECTION OF BLADDER TUMOR (TURBT);  Surgeon: Hollice Espy, MD;  Location: [facility];  Service: Urology;  Laterality: N/A;  . UPPER GI ENDOSCOPY  2012, 2014     OB History    Gravida  2   Para  2   Term      Preterm      AB      Living  2     SAB      TAB      Ectopic      Multiple      Live Births           Obstetric Comments  Age at first menstrual period 50 Age at first pregnancy 70         Home Medications    Prior to Admission medications   Medication Sig Start Date End Date Taking? Authorizing Provider  acetaminophen (TYLENOL) 500 MG tablet Take 1,000 mg by mouth every 6 (six) hours as needed for mild pain.    Yes [provider]  alum & mag hydroxide-simeth (MYLANTA) 200-200-20 MG/5ML suspension Take 30 mLs by mouth every 6 (six) hours as needed for indigestion or heartburn.   Yes [provider]  Artificial Tear Solution (GENTEAL TEARS OP) Apply 1 application to eye at bedtime.   Yes [provider]  aspirin EC 325 MG EC tablet 1 tab a day for the next 30 days to prevent blood clots 09/20/17  Yes Shepperson, Kirstin, PA-C  Calcium Carbonate-Vit D-Min (CALCIUM 1200 PO) Take 1 tablet by mouth daily.    Yes [provider]  carboxymethylcellulose (REFRESH PLUS) 0.5 % SOLN Place 1 drop into both eyes every morning.    Yes [provider]  Cholecalciferol (VITAMIN D3) 2000 UNITS TABS Take 1 tablet by mouth every evening. bedtime   Yes [provider]  docusate sodium (COLACE) 100 MG capsule 1 tab 2 times a day while on narcotics.  STOOL SOFTENER 09/20/17  Yes Shepperson, Kirstin, PA-C  gabapentin (NEURONTIN) 300 MG capsule 1 tablet at night for nerve pain 09/20/17  Yes Shepperson, Kirstin, PA-C    levothyroxine (SYNTHROID, LEVOTHROID) 125 MCG tablet Take 1 tablet (125 mcg total) by mouth daily. 09/11/15  Yes Margarita Rana, MD  lisinopril (PRINIVIL,ZESTRIL) 40 MG tablet Take 1 tablet (40 mg total) by mouth daily. bedtime 05/13/15  Yes Margarita Rana, MD  omeprazole (PRILOSEC) 20 MG capsule Take 1 capsule (20 mg total) by mouth daily. 04/15/15  Yes Margarita Rana, MD  oxyCODONE (OXY IR/ROXICODONE) 5 MG immediate release tablet 1 po q 4 hrs prn pain.  Patient is s/p left total knee replacement on  09/20/17 09/20/17  Yes Shepperson, Kirstin, PA-C  polyethylene glycol (MIRALAX / GLYCOLAX) packet Not pertinent to current symptomatology  therefore not examined. Patient taking differently: Take 17 g by mouth daily as needed for moderate constipation. Not pertinent to current symptomatology therefore not examined. 09/20/17  Yes Shepperson, Kirstin, PA-C  simvastatin (ZOCOR) 20 MG tablet Take 1 tablet (20 mg total) by mouth daily. bedtime Patient taking differently: Take 20 mg by mouth daily at 6 PM. bedtime 05/13/15  Yes Margarita Rana, MD  spironolactone (ALDACTONE) 25 MG tablet Take 25 mg by mouth daily.   Yes [provider]  vitamin B-12 (CYANOCOBALAMIN) 100 MCG tablet Take 100 mcg by mouth daily.   Yes [provider]  ondansetron (ZOFRAN) 4 MG tablet Take 1 tablet (4 mg total) by mouth every 6 (six) hours as needed for nausea. 08/11/16   Shepperson, Kirstin, PA-C    Family History Family History  Problem Relation Age of Onset  . Heart disease Mother        CHF, CAD  . Hypertension Mother   . Mental illness Mother        Dementia  . Cancer Mother        breast, Brain Cancer  . Breast cancer Mother 79  . Cancer Father        lung  . Heart disease Father        MI  . Heart disease Sister   . Cancer Sister        Breast  . Barrett's esophagus Sister   . Hyperlipidemia Sister   . Breast cancer Sister 54  . Asthma Brother   . Heart disease Brother   . Diabetes Brother    . Anxiety disorder Brother   . Neuropathy Brother   . Neuropathy Sister   . Anxiety disorder Sister   . Mental illness Sister        Dementia  . Fibromyalgia Sister     Social History Social History   Tobacco Use  . Smoking status: Former Smoker    Packs/day: 0.50    Years: 20.00    Pack years: 10.00    Types: Cigarettes    Last attempt to quit: 06/20/1977    Years since quitting: 40.2  . Smokeless tobacco: Never Used  Substance Use Topics  . Alcohol use: Yes    Alcohol/week: 0.6 - 1.8 oz    Types: 1 - 2 Standard drinks or equivalent per week  . Drug use: No     Allergies   Carvedilol and Hctz [hydrochlorothiazide]   Review of Systems Review of Systems  Constitutional: Negative for chills, diaphoresis, fatigue and fever.  HENT: Negative for congestion, rhinorrhea and sneezing.   Eyes: Negative.   Respiratory: Negative for cough, chest tightness and shortness of breath.   Cardiovascular: Negative for chest pain and leg swelling.  Gastrointestinal: Positive for abdominal pain, nausea and vomiting. Negative for blood in stool and diarrhea.  Genitourinary: Negative for difficulty urinating, flank pain, frequency and hematuria.  Musculoskeletal: Positive for arthralgias. Negative for back pain.  Skin: Negative for rash.  Neurological: Negative for dizziness, speech difficulty, weakness, numbness and headaches.     Physical Exam Updated Vital Signs BP (!) 158/83 (BP Location: Left Arm)   Pulse 98   Temp 98.8 F (37.1 C) (Oral)   Resp 18   SpO2 95%   Physical Exam  Constitutional: She is oriented to person, place, and time. She appears well-developed and well-nourished.  HENT:  Head: Normocephalic and atraumatic.  Eyes: Pupils are equal, round, and reactive to light.  Neck: Normal range of motion. Neck  supple.  Cardiovascular: Normal rate, regular rhythm and normal heart sounds.  Pulmonary/Chest: Effort normal and breath sounds normal. No respiratory  distress. She has no wheezes. She has no rales. She exhibits no tenderness.  Abdominal: Soft. She exhibits distension. Bowel sounds are decreased. There is generalized tenderness. There is no rebound and no guarding.  Musculoskeletal: Normal range of motion. She exhibits edema.  Patient has diffuse swelling to the left knee which is consistent with her recent operation.  Overall she says it is feeling better.  Lymphadenopathy:    She has no cervical adenopathy.  Neurological: She is alert and oriented to person, place, and time.  Skin: Skin is warm and dry. No rash noted.  Psychiatric: She has a normal mood and affect.     ED Treatments / Results  Labs (all labs ordered are listed, but only abnormal results are displayed) Labs Reviewed  CBC WITH DIFFERENTIAL/PLATELET - Abnormal; Notable for the following components:      Result Value   WBC 16.6 (*)    RBC 3.55 (*)    Hemoglobin 10.5 (*)    HCT 32.0 (*)    Neutro Abs 15.0 (*)    All other components within normal limits  I-STAT CHEM 8, ED - Abnormal; Notable for the following components:   Sodium 130 (*)    Chloride 97 (*)    Glucose, Bld 119 (*)    Calcium, Ion 1.08 (*)    Hemoglobin 10.5 (*)    HCT 31.0 (*)    All other components within normal limits  COMPREHENSIVE METABOLIC PANEL    EKG EKG Interpretation  Date/Time:  Friday September 23 2017 22:48:25 EDT Ventricular Rate:  97 PR Interval:    QRS Duration: 94 QT Interval:  353 QTC Calculation: 449 R Axis:   43 Text Interpretation:  Sinus rhythm SINCE LAST TRACING HEART RATE HAS INCREASED Confirmed by Malvin Johns [phone]) on 09/23/2017 10:54:27 PM Also confirmed by Malvin Johns [phone])  on 09/23/2017 11:00:46 PM   Radiology Ct Abdomen Pelvis W Contrast  Addendum Date: 09/23/2017   ADDENDUM REPORT: 09/23/2017 21:58 ADDENDUM: These results were called by telephone at the time of interpretation on 09/23/2017 at 9:58 pm to Dr. Marcelino Scot, who verbally acknowledged these results.  Electronically Signed   By: Ashley Royalty M.D.   On: 09/23/2017 21:58   Result Date: 09/23/2017 CLINICAL DATA:  Constipation since Wednesday. Abdominal pain. History of recent total knee arthroplasty on 09/19/2017. EXAM: CT ABDOMEN AND PELVIS WITH CONTRAST TECHNIQUE: Multidetector CT imaging of the abdomen and pelvis was performed using the standard protocol following bolus administration of intravenous contrast. CONTRAST:  159mL ISOVUE-300 IOPAMIDOL (ISOVUE-300) INJECTION 61%, 77mL ISOVUE-300 IOPAMIDOL (ISOVUE-300) INJECTION 61% COMPARISON:  10/15/2014 FINDINGS: Lower chest: Free intraperitoneal air is noted beneath the diaphragm. The included heart is normal in size without pericardial effusion. Bibasilar atelectasis is noted without effusion. Hepatobiliary: Homogeneous attenuation of the liver. No portal venous gas. No space-occupying mass of the liver. Unremarkable gallbladder without stones. Pancreas: Normal Spleen: Normal Adrenals/Urinary Tract: Normal bilateral adrenal glands. No nephrolithiasis nor obstructive uropathy. Prominent right upper pole renal pyramid. Small dromedary hump of the left kidney, a normal variant. No hydroureteronephrosis. The urinary bladder is unremarkable for degree of distention. Stomach/Bowel: Segmental thickening of an approximately 5 cm segment of sigmoid colon with pericolonic inflammation and a few scattered colonic diverticula noted. Normal-appearing appendix. No pneumatosis. Small hiatal hernia. Decompressed stomach. Normal small bowel rotation. No small bowel dilatation, pneumatosis or obstruction. Vascular/Lymphatic: Mild  aortoiliac atherosclerosis without aneurysm or dissection. No lymphadenopathy by CT size criteria. Reproductive: Normal uterus and bilateral adnexa without mass. Other: No significant free fluid. Musculoskeletal: Thoracolumbar spondylosis. No aggressive osseous lesions. IMPRESSION: 1. Segmental thickening of the sigmoid colon with a few scattered colonic  diverticula noted. Suspect this is the cause of the patient's free intraperitoneal air. Pertinent negatives would include no bowel obstruction or abscess. No free fluid. 2. Thoracolumbar degenerative disc disease. 3. Aortoiliac atherosclerosis. Electronically Signed: By: Ashley Royalty M.D. On: 09/23/2017 21:34    Procedures Procedures (including critical care time)  Medications Ordered in ED Medications  sodium chloride 0.9 % bolus 500 mL (500 mLs Intravenous New Bag/Given 09/23/17 2306)  piperacillin-tazobactam (ZOSYN) IVPB 3.375 g (0 g Intravenous Stopped 09/23/17 2333)  fentaNYL (SUBLIMAZE) injection 50 mcg (50 mcg Intravenous Given 09/23/17 2303)  ondansetron (ZOFRAN) injection 4 mg (4 mg Intravenous Given 09/23/17 2302)     Initial Impression / Assessment and Plan / ED Course  I have reviewed the triage vital signs and the nursing notes.  Pertinent labs & imaging results that were available during my care of the patient were reviewed by me and considered in my medical decision making (see chart for details).     10:18 surgery paged.  Started on IV zosyn, fluids, Fentanyl.  10:40 discussed with Dr. Marcello Moores who will see the patient.  Pt to be admitted by surgery, labs reviewed.  WBC count elevated.  Final Clinical Impressions(s) / ED Diagnoses   Final diagnoses:  Perforated bowel [facility])    ED Discharge Orders    None       Malvin Johns, MD 09/23/17 2342

## 2017-09-23 NOTE — ED Notes (Signed)
Bed: WA11 Expected date:  Expected time:  Means of arrival:  Comments: Free Air in [place]- per [facility]

## 2017-09-23 NOTE — ED Triage Notes (Signed)
Pt presents after a CT showed free air in her belly. [facility] MD aware of patient.

## 2017-09-23 NOTE — H&P (Signed)
Barbara Hernandez is an 77 y.o. female.   Chief Complaint: abd pain HPI: 77 y.o. F s/p recent knee replacement with increasingly worse abd pain and bloating over the past couple days.  Last BM was Wed.  No fevers.  Occasional nausea.  She reported these symptoms to her Orthopedic MD and he ordered a CT scan. This showed a small amt of free air and inflamed sigmoid consistent with diverticulitis.    Past Medical History:  Diagnosis Date  . Anemia   . Arthritis   . Barrett's esophagus 2012   2010 upper endoscopy showed only reflux. 2012 biopsies suggested Barrett's epithelial changes.  . Bladder tumor   . BPPV (benign paroxysmal positional vertigo) 2004  . Family history of adverse reaction to anesthesia    sister had memory problems after delivery of child  . GERD (gastroesophageal reflux disease)   . H/O measles   . Heart murmur   . Hematuria   . Hematuria   . Hemorrhoid   . Hemorrhoids   . History of chicken pox   . Hyperlipidemia   . Hypertension   . Hypothyroidism   . Joint pain   . Mild left ventricular hypertrophy   . Onychomycosis   . Primary localized osteoarthritis of left knee 09/06/2017  . Reflux   . Reflux esophagitis   . 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 Sleep apnea 2011   Uses C-Pap machine  . Thyroid disease     Past Surgical History:  Procedure Laterality Date  . BLEPHAROPLASTY Bilateral 2015  . BREAST EXCISIONAL BIOPSY Left 1988   benign  . COLONOSCOPY W/ BIOPSIES N/A 01/16/2013   No source for GI blood loss, mild lymphatic prominence in the rectum.  . ESOPHAGOGASTRODUODENOSCOPY (EGD) WITH PROPOFOL N/A 03/15/2016   Procedure: ESOPHAGOGASTRODUODENOSCOPY (EGD) WITH PROPOFOL;  Surgeon: Robert Bellow, MD;  Location: [facility];  Service: Endoscopy;  Laterality: N/A;  . EYE SURGERY Bilateral 2006   cataract  . Left wrist fracture    . POPLITEAL SYNOVIAL CYST EXCISION Right   . TOTAL KNEE ARTHROPLASTY Right 08/09/2016   Procedure: TOTAL  KNEE ARTHROPLASTY;  Surgeon: Elsie Saas, MD;  Location: [facility];  Service: Orthopedics;  Laterality: Right;  . TOTAL KNEE ARTHROPLASTY Left 09/19/2017   Procedure: TOTAL KNEE ARTHROPLASTY;  Surgeon: Elsie Saas, MD;  Location: [facility];  Service: Orthopedics;  Laterality: Left;  . TRANSURETHRAL RESECTION OF BLADDER TUMOR N/A 11/20/2014   Procedure: TRANSURETHRAL RESECTION OF BLADDER TUMOR (TURBT);  Surgeon: Hollice Espy, MD;  Location: [facility];  Service: Urology;  Laterality: N/A;  . UPPER GI ENDOSCOPY  2012, 2014    Family History  Problem Relation Age of Onset  . Heart disease Mother        CHF, CAD  . Hypertension Mother   . Mental illness Mother        Dementia  . Cancer Mother        breast, Brain Cancer  . Breast cancer Mother 38  . Cancer Father        lung  . Heart disease Father        MI  . Heart disease Sister   . Cancer Sister        Breast  . Barrett's esophagus Sister   . Hyperlipidemia Sister   . Breast cancer Sister 18  . Asthma Brother   . Heart disease Brother   . Diabetes Brother   . Anxiety disorder Brother   . Neuropathy Brother   .  Neuropathy Sister   . Anxiety disorder Sister   . Mental illness Sister        Dementia  . Fibromyalgia Sister    Social History:  reports that she quit smoking about 40 years ago. Her smoking use included cigarettes. She has a 10.00 pack-year smoking history. She has never used smokeless tobacco. She reports that she drinks about 0.6 - 1.8 oz of alcohol per week. She reports that she does not use drugs.  Allergies:  Allergies  Allergen Reactions  . Carvedilol Other (See Comments)    No energy, fatigued   . Hctz [Hydrochlorothiazide] Other (See Comments)    Abnormal labs     (Not in a hospital admission)  No results found for this or any previous visit (from the past 48 hour(s)). Ct Abdomen Pelvis W Contrast  Addendum Date: 09/23/2017   ADDENDUM REPORT: 09/23/2017 21:58 ADDENDUM: These results were called by  telephone at the time of interpretation on 09/23/2017 at 9:58 pm to Dr. Marcelino Scot, who verbally acknowledged these results. Electronically Signed   By: Ashley Royalty M.D.   On: 09/23/2017 21:58   Result Date: 09/23/2017 CLINICAL DATA:  Constipation since Wednesday. Abdominal pain. History of recent total knee arthroplasty on 09/19/2017. EXAM: CT ABDOMEN AND PELVIS WITH CONTRAST TECHNIQUE: Multidetector CT imaging of the abdomen and pelvis was performed using the standard protocol following bolus administration of intravenous contrast. CONTRAST:  145mL ISOVUE-300 IOPAMIDOL (ISOVUE-300) INJECTION 61%, 75mL ISOVUE-300 IOPAMIDOL (ISOVUE-300) INJECTION 61% COMPARISON:  10/15/2014 FINDINGS: Lower chest: Free intraperitoneal air is noted beneath the diaphragm. The included heart is normal in size without pericardial effusion. Bibasilar atelectasis is noted without effusion. Hepatobiliary: Homogeneous attenuation of the liver. No portal venous gas. No space-occupying mass of the liver. Unremarkable gallbladder without stones. Pancreas: Normal Spleen: Normal Adrenals/Urinary Tract: Normal bilateral adrenal glands. No nephrolithiasis nor obstructive uropathy. Prominent right upper pole renal pyramid. Small dromedary hump of the left kidney, a normal variant. No hydroureteronephrosis. The urinary bladder is unremarkable for degree of distention. Stomach/Bowel: Segmental thickening of an approximately 5 cm segment of sigmoid colon with pericolonic inflammation and a few scattered colonic diverticula noted. Normal-appearing appendix. No pneumatosis. Small hiatal hernia. Decompressed stomach. Normal small bowel rotation. No small bowel dilatation, pneumatosis or obstruction. Vascular/Lymphatic: Mild aortoiliac atherosclerosis without aneurysm or dissection. No lymphadenopathy by CT size criteria. Reproductive: Normal uterus and bilateral adnexa without mass. Other: No significant free fluid. Musculoskeletal: Thoracolumbar spondylosis.  No aggressive osseous lesions. IMPRESSION: 1. Segmental thickening of the sigmoid colon with a few scattered colonic diverticula noted. Suspect this is the cause of the patient's free intraperitoneal air. Pertinent negatives would include no bowel obstruction or abscess. No free fluid. 2. Thoracolumbar degenerative disc disease. 3. Aortoiliac atherosclerosis. Electronically Signed: By: Ashley Royalty M.D. On: 09/23/2017 21:34    Review of Systems  Constitutional: Negative for chills and fever.  HENT: Negative for hearing loss and nosebleeds.   Eyes: Negative for blurred vision and double vision.  Respiratory: Negative for cough and shortness of breath.   Cardiovascular: Negative for chest pain and palpitations.  Gastrointestinal: Positive for abdominal pain, constipation, heartburn and nausea. Negative for vomiting.  Genitourinary: Negative for dysuria, frequency and urgency.  Skin: Negative for itching and rash.  Neurological: Negative for dizziness and headaches.    Blood pressure (!) 158/83, pulse 98, temperature 98.8 F (37.1 C), temperature source Oral, resp. rate 18, SpO2 95 %. Physical Exam  Constitutional: She is oriented to person, place, and time. She  appears well-developed and well-nourished. No distress.  HENT:  Head: Normocephalic and atraumatic.  Eyes: Pupils are equal, round, and reactive to light. Conjunctivae and EOM are normal.  Neck: Normal range of motion. Neck supple.  Cardiovascular: Normal rate and regular rhythm.  Respiratory: Effort normal. No respiratory distress.  GI: Soft. She exhibits distension. There is tenderness (suprapubic). There is no rebound and no guarding.  Neurological: She is alert and oriented to person, place, and time.  Skin: Skin is warm and dry.     Assessment/Plan 77 y.o. F s/p knee replacement with worsening abd pain.  CT shows diverticulitis with perforation and no abscess.  She is hemodynamically stable.  Will admit to the floor and start  IV abx and IVF's.  NPO.  Will follow.  Will need operative intervention if her clinical status worsen.  Rosario Adie., MD 10/26/8467, 11:12 PM

## 2017-09-24 LAB — COMPREHENSIVE METABOLIC PANEL
ALBUMIN: 3.1 g/dL — AB (ref 3.5–5.0)
ALK PHOS: 82 U/L (ref 38–126)
ALT: 20 U/L (ref 14–54)
AST: 28 U/L (ref 15–41)
Anion gap: 8 (ref 5–15)
BILIRUBIN TOTAL: 2.4 mg/dL — AB (ref 0.3–1.2)
BUN: 8 mg/dL (ref 6–20)
CO2: 22 mmol/L (ref 22–32)
CREATININE: 0.49 mg/dL (ref 0.44–1.00)
Calcium: 8.3 mg/dL — ABNORMAL LOW (ref 8.9–10.3)
Chloride: 99 mmol/L — ABNORMAL LOW (ref 101–111)
GFR calc Af Amer: >60 mL/min (ref >60)
GLUCOSE: 120 mg/dL — AB (ref 65–99)
Potassium: 4.1 mmol/L (ref 3.5–5.1)
Sodium: 129 mmol/L — ABNORMAL LOW (ref 135–145)
TOTAL PROTEIN: 6.4 g/dL — AB (ref 6.5–8.1)

## 2017-09-24 LAB — CBC
HCT: 29.4 % — ABNORMAL LOW (ref 36.0–46.0)
Hemoglobin: 9.6 g/dL — ABNORMAL LOW (ref 12.0–15.0)
MCH: 29.6 pg (ref 26.0–34.0)
MCHC: 32.7 g/dL (ref 30.0–36.0)
MCV: 90.7 fL (ref 78.0–100.0)
PLATELETS: 228 10*3/uL (ref 150–400)
RBC: 3.24 MIL/uL — ABNORMAL LOW (ref 3.87–5.11)
RDW: 14.1 % (ref 11.5–15.5)
WBC: 16.4 10*3/uL — ABNORMAL HIGH (ref 4.0–10.5)

## 2017-09-24 LAB — BASIC METABOLIC PANEL
Anion gap: 8 (ref 5–15)
BUN: 8 mg/dL (ref 6–20)
CHLORIDE: 101 mmol/L (ref 101–111)
CO2: 25 mmol/L (ref 22–32)
CREATININE: 0.56 mg/dL (ref 0.44–1.00)
Calcium: 8 mg/dL — ABNORMAL LOW (ref 8.9–10.3)
GFR calc Af Amer: >60 mL/min (ref >60)
GFR calc non Af Amer: >60 mL/min (ref >60)
Glucose, Bld: 118 mg/dL — ABNORMAL HIGH (ref 65–99)
Potassium: 4 mmol/L (ref 3.5–5.1)
SODIUM: 134 mmol/L — AB (ref 135–145)

## 2017-09-24 MED ORDER — DIPHENHYDRAMINE HCL 50 MG/ML IJ SOLN: 12.5000 mg | Freq: Four times a day (QID) | INTRAMUSCULAR | Status: DC | PRN

## 2017-09-24 MED ORDER — GABAPENTIN 300 MG PO CAPS
300.0000 mg | ORAL_CAPSULE | Freq: Every day | ORAL | Status: DC
  Administered 2017-09-24 – 2017-09-27 (×5): 300 mg via ORAL
  Filled 2017-09-24 (×5): qty 1

## 2017-09-24 MED ORDER — DIPHENHYDRAMINE HCL 12.5 MG/5ML PO ELIX: 12.5000 mg | ORAL_SOLUTION | Freq: Four times a day (QID) | ORAL | Status: DC | PRN

## 2017-09-24 MED ORDER — KCL IN DEXTROSE-NACL 20-5-0.45 MEQ/L-%-% IV SOLN
INTRAVENOUS | Status: DC
  Administered 2017-09-24: 12:00:00 via INTRAVENOUS
  Administered 2017-09-24: 100 mL/h via INTRAVENOUS
  Administered 2017-09-25 (×2): via INTRAVENOUS
  Administered 2017-09-26: 1000 mL via INTRAVENOUS
  Filled 2017-09-24 (×7): qty 1000

## 2017-09-24 MED ORDER — SODIUM CHLORIDE 0.9 % IV SOLN
2.0000 g | Freq: Every day | INTRAVENOUS | Status: DC
  Administered 2017-09-24 – 2017-09-28 (×5): 2 g via INTRAVENOUS
  Filled 2017-09-24 (×5): qty 2

## 2017-09-24 MED ORDER — METHOCARBAMOL 500 MG PO TABS
500.0000 mg | ORAL_TABLET | Freq: Four times a day (QID) | ORAL | Status: DC | PRN
  Administered 2017-09-25 (×2): 500 mg via ORAL
  Filled 2017-09-24 (×2): qty 1

## 2017-09-24 MED ORDER — CALCIUM CARBONATE-VITAMIN D 500-200 MG-UNIT PO TABS
1.0000 | ORAL_TABLET | Freq: Every day | ORAL | Status: DC
  Administered 2017-09-24 – 2017-09-28 (×5): 1 via ORAL
  Filled 2017-09-24 (×5): qty 1

## 2017-09-24 MED ORDER — LISINOPRIL 20 MG PO TABS
40.0000 mg | ORAL_TABLET | Freq: Every day | ORAL | Status: DC
  Administered 2017-09-24 – 2017-09-28 (×5): 40 mg via ORAL
  Filled 2017-09-24 (×5): qty 2

## 2017-09-24 MED ORDER — DOCUSATE SODIUM 100 MG PO CAPS
100.0000 mg | ORAL_CAPSULE | Freq: Two times a day (BID) | ORAL | Status: DC
  Administered 2017-09-24 – 2017-09-28 (×8): 100 mg via ORAL
  Filled 2017-09-24 (×10): qty 1

## 2017-09-24 MED ORDER — ONDANSETRON HCL 4 MG/2ML IJ SOLN
4.0000 mg | Freq: Four times a day (QID) | INTRAMUSCULAR | Status: DC | PRN
  Administered 2017-09-24 – 2017-09-25 (×2): 4 mg via INTRAVENOUS
  Filled 2017-09-24 (×2): qty 2

## 2017-09-24 MED ORDER — POLYVINYL ALCOHOL 1.4 % OP SOLN
1.0000 [drp] | Freq: Two times a day (BID) | OPHTHALMIC | Status: DC
  Administered 2017-09-24 – 2017-09-27 (×7): 1 [drp] via OPHTHALMIC
  Filled 2017-09-24: qty 15

## 2017-09-24 MED ORDER — PANTOPRAZOLE SODIUM 40 MG PO TBEC
40.0000 mg | DELAYED_RELEASE_TABLET | Freq: Every day | ORAL | Status: DC
  Administered 2017-09-24 – 2017-09-28 (×5): 40 mg via ORAL
  Filled 2017-09-24 (×6): qty 1

## 2017-09-24 MED ORDER — ALUM & MAG HYDROXIDE-SIMETH 200-200-20 MG/5ML PO SUSP: 30.0000 mL | Freq: Four times a day (QID) | ORAL | Status: DC | PRN

## 2017-09-24 MED ORDER — SIMVASTATIN 20 MG PO TABS
20.0000 mg | ORAL_TABLET | Freq: Every day | ORAL | Status: DC
  Administered 2017-09-24 – 2017-09-27 (×4): 20 mg via ORAL
  Filled 2017-09-24 (×4): qty 1

## 2017-09-24 MED ORDER — METRONIDAZOLE IN NACL 5-0.79 MG/ML-% IV SOLN
500.0000 mg | Freq: Three times a day (TID) | INTRAVENOUS | Status: DC
  Administered 2017-09-24 – 2017-09-28 (×13): 500 mg via INTRAVENOUS
  Filled 2017-09-24 (×13): qty 100

## 2017-09-24 MED ORDER — SPIRONOLACTONE 25 MG PO TABS
25.0000 mg | ORAL_TABLET | Freq: Every day | ORAL | Status: DC
  Administered 2017-09-24 – 2017-09-28 (×5): 25 mg via ORAL
  Filled 2017-09-24 (×5): qty 1

## 2017-09-24 MED ORDER — VITAMIN D 1000 UNITS PO TABS
2000.0000 [IU] | ORAL_TABLET | Freq: Every evening | ORAL | Status: DC
  Administered 2017-09-24 – 2017-09-27 (×4): 2000 [IU] via ORAL
  Filled 2017-09-24 (×4): qty 2

## 2017-09-24 MED ORDER — LEVOTHYROXINE SODIUM 25 MCG PO TABS
125.0000 ug | ORAL_TABLET | Freq: Every day | ORAL | Status: DC
  Administered 2017-09-24 – 2017-09-28 (×5): 125 ug via ORAL
  Filled 2017-09-24 (×5): qty 1

## 2017-09-24 MED ORDER — LIP MEDEX EX OINT
TOPICAL_OINTMENT | CUTANEOUS | Status: AC
  Administered 2017-09-24: 22:00:00
  Filled 2017-09-24: qty 7

## 2017-09-24 MED ORDER — ONDANSETRON 4 MG PO TBDP: 4.0000 mg | ORAL_TABLET | Freq: Four times a day (QID) | ORAL | Status: DC | PRN

## 2017-09-24 MED ORDER — CARBOXYMETHYLCELLULOSE SODIUM 0.5 % OP SOLN: 1.0000 [drp] | OPHTHALMIC | Status: DC

## 2017-09-24 MED ORDER — MORPHINE SULFATE (PF) 2 MG/ML IV SOLN
2.0000 mg | INTRAVENOUS | Status: DC | PRN
  Administered 2017-09-25: 2 mg via INTRAVENOUS
  Filled 2017-09-24: qty 1

## 2017-09-24 MED ORDER — ENOXAPARIN SODIUM 40 MG/0.4ML ~~LOC~~ SOLN
40.0000 mg | SUBCUTANEOUS | Status: DC
  Administered 2017-09-24 – 2017-09-28 (×5): 40 mg via SUBCUTANEOUS
  Filled 2017-09-24 (×6): qty 0.4

## 2017-09-24 MED ORDER — ACETAMINOPHEN 500 MG PO TABS
1000.0000 mg | ORAL_TABLET | Freq: Four times a day (QID) | ORAL | Status: DC | PRN
  Administered 2017-09-24 – 2017-09-28 (×9): 1000 mg via ORAL
  Filled 2017-09-24 (×9): qty 2

## 2017-09-24 NOTE — ED Notes (Signed)
ED TO INPATIENT HANDOFF REPORT  Name/Age/Gender Barbara Hernandez 77 y.o. female  Code Status Code Status History    Date Active Date Inactive Code Status Order ID Comments User Context   09/19/2017 1407 09/20/2017 1635 Full Code [id number]  Nilda Riggs Inpatient   08/09/2016 1213 08/11/2016 1420 Full Code [id number]  Matthew Saras, PA-C Inpatient      Home/SNF/Other Home  Chief Complaint Possible diverticulitis, abdominal pain  Level of Care/Admitting Diagnosis ED Disposition    ED Disposition Condition [facility] Area: [facility] [[id number]]  Level of Care: Med-Surg [16]  Diagnosis: Perforated diverticulum [681275]  Admitting Physician: CCS, [place]  Attending Physician: CCS, MD [[id number]]  Estimated length of stay: 3 - 4 days  Certification:: I certify this patient will need inpatient services for at least 2 midnights  PT Class (Do Not Modify): Inpatient [101]  PT Acc Code (Do Not Modify): Private [1]       Medical History Past Medical History:  Diagnosis Date  . Anemia   . Arthritis   . Barrett's esophagus 2012   2010 upper endoscopy showed only reflux. 2012 biopsies suggested Barrett's epithelial changes.  . Bladder tumor   . BPPV (benign paroxysmal positional vertigo) 2004  . Family history of adverse reaction to anesthesia    sister had memory problems after delivery of child  . GERD (gastroesophageal reflux disease)   . H/O measles   . Heart murmur   . Hematuria   . Hematuria   . Hemorrhoid   . Hemorrhoids   . History of chicken pox   . Hyperlipidemia   . Hypertension   . Hypothyroidism   . Joint pain   . Mild left ventricular hypertrophy   . Onychomycosis   . Primary localized osteoarthritis of left knee 09/06/2017  . Reflux   . Reflux esophagitis   . 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 Sleep apnea 2011   Uses C-Pap machine  . Thyroid disease     Allergies Allergies  Allergen Reactions   . Carvedilol Other (See Comments)    No energy, fatigued   . Hctz [Hydrochlorothiazide] Other (See Comments)    Abnormal labs    IV Location/Drains/Wounds Patient Lines/Drains/Airways Status   Active Line/Drains/Airways    Name:   Placement date:   Placement time:   Site:   Days:   Peripheral IV 09/19/17 Left Hand   09/19/17    0630    Hand   5   Peripheral IV 09/23/17 Right Hand   09/23/17    2300    Hand   1   Urethral Catheter Virginia Rochester RN Latex 16 Fr.   09/19/17    0759    Latex   5   Incision (Closed) 08/09/16 Knee Right   08/09/16    1254     411   Incision (Closed) 09/19/17 Knee Left   09/19/17    0853     5          Labs/Imaging Results for orders placed or performed during the hospital encounter of 09/23/17 (from the past 48 hour(s))  CBC with Differential     Status: Abnormal   Collection Time: 09/23/17 11:04 PM  Result Value Ref Range   WBC 16.6 (H) 4.0 - 10.5 K/uL   RBC 3.55 (L) 3.87 - 5.11 MIL/uL   Hemoglobin 10.5 (L) 12.0 - 15.0 g/dL   HCT 32.0 (L) 36.0 - 46.0 %  MCV 90.1 78.0 - 100.0 fL   MCH 29.6 26.0 - 34.0 pg   MCHC 32.8 30.0 - 36.0 g/dL   RDW 14.0 11.5 - 15.5 %   Platelets 236 150 - 400 K/uL   Neutrophils Relative % 90 %   Neutro Abs 15.0 (H) 1.7 - 7.7 K/uL   Lymphocytes Relative 5 %   Lymphs Abs 0.8 0.7 - 4.0 K/uL   Monocytes Relative 4 %   Monocytes Absolute 0.6 0.1 - 1.0 K/uL   Eosinophils Relative 1 %   Eosinophils Absolute 0.2 0.0 - 0.7 K/uL   Basophils Relative 0 %   Basophils Absolute 0.0 0.0 - 0.1 K/uL    Comment: Performed at [facility], [place] [address], [place], [place] [address]  Comprehensive metabolic panel     Status: Abnormal   Collection Time: 09/23/17 11:04 PM  Result Value Ref Range   Sodium 129 (L) 135 - 145 mmol/L   Potassium 4.1 3.5 - 5.1 mmol/L   Chloride 99 (L) 101 - 111 mmol/L   CO2 22 22 - 32 mmol/L   Glucose, Bld 120 (H) 65 - 99 mg/dL   BUN 8 6 - 20 mg/dL   Creatinine, Ser 0.49 0.44 - 1.00  mg/dL   Calcium 8.3 (L) 8.9 - 10.3 mg/dL   Total Protein 6.4 (L) 6.5 - 8.1 g/dL   Albumin 3.1 (L) 3.5 - 5.0 g/dL   AST 28 15 - 41 U/L   ALT 20 14 - 54 U/L   Alkaline Phosphatase 82 38 - 126 U/L   Total Bilirubin 2.4 (H) 0.3 - 1.2 mg/dL   GFR calc non Af Amer >60 >60 mL/min   GFR calc Af Amer >60 >60 mL/min    Comment: (NOTE) The eGFR has been calculated using the CKD EPI equation. This calculation has not been validated in all clinical situations. eGFR's persistently <60 mL/min signify possible Chronic Kidney Disease.    Anion gap 8 5 - 15    Comment: Performed at [facility], [place] [address] [place], [place] [address]  I-stat Chem 8, ED     Status: Abnormal   Collection Time: 09/23/17 11:15 PM  Result Value Ref Range   Sodium 130 (L) 135 - 145 mmol/L   Potassium 4.0 3.5 - 5.1 mmol/L   Chloride 97 (L) 101 - 111 mmol/L   BUN 6 6 - 20 mg/dL   Creatinine, Ser 0.50 0.44 - 1.00 mg/dL   Glucose, Bld 119 (H) 65 - 99 mg/dL   Calcium, Ion 1.08 (L) 1.15 - 1.40 mmol/L   TCO2 24 22 - 32 mmol/L   Hemoglobin 10.5 (L) 12.0 - 15.0 g/dL   HCT 31.0 (L) 36.0 - 46.0 %   Ct Abdomen Pelvis W Contrast  Addendum Date: 09/23/2017   ADDENDUM REPORT: 09/23/2017 21:58 ADDENDUM: These results were called by telephone at the time of interpretation on 09/23/2017 at 9:58 pm to Dr. Marcelino Scot, who verbally acknowledged these results. Electronically Signed   By: Ashley Royalty M.D.   On: 09/23/2017 21:58   Result Date: 09/23/2017 CLINICAL DATA:  Constipation since Wednesday. Abdominal pain. History of recent total knee arthroplasty on 09/19/2017. EXAM: CT ABDOMEN AND PELVIS WITH CONTRAST TECHNIQUE: Multidetector CT imaging of the abdomen and pelvis was performed using the standard protocol following bolus administration of intravenous contrast. CONTRAST:  121m ISOVUE-300 IOPAMIDOL (ISOVUE-300) INJECTION 61%, 320mISOVUE-300 IOPAMIDOL (ISOVUE-300) INJECTION 61% COMPARISON:  10/15/2014 FINDINGS: Lower  chest: Free intraperitoneal air is  noted beneath the diaphragm. The included heart is normal in size without pericardial effusion. Bibasilar atelectasis is noted without effusion. Hepatobiliary: Homogeneous attenuation of the liver. No portal venous gas. No space-occupying mass of the liver. Unremarkable gallbladder without stones. Pancreas: Normal Spleen: Normal Adrenals/Urinary Tract: Normal bilateral adrenal glands. No nephrolithiasis nor obstructive uropathy. Prominent right upper pole renal pyramid. Small dromedary hump of the left kidney, a normal variant. No hydroureteronephrosis. The urinary bladder is unremarkable for degree of distention. Stomach/Bowel: Segmental thickening of an approximately 5 cm segment of sigmoid colon with pericolonic inflammation and a few scattered colonic diverticula noted. Normal-appearing appendix. No pneumatosis. Small hiatal hernia. Decompressed stomach. Normal small bowel rotation. No small bowel dilatation, pneumatosis or obstruction. Vascular/Lymphatic: Mild aortoiliac atherosclerosis without aneurysm or dissection. No lymphadenopathy by CT size criteria. Reproductive: Normal uterus and bilateral adnexa without mass. Other: No significant free fluid. Musculoskeletal: Thoracolumbar spondylosis. No aggressive osseous lesions. IMPRESSION: 1. Segmental thickening of the sigmoid colon with a few scattered colonic diverticula noted. Suspect this is the cause of the patient's free intraperitoneal air. Pertinent negatives would include no bowel obstruction or abscess. No free fluid. 2. Thoracolumbar degenerative disc disease. 3. Aortoiliac atherosclerosis. Electronically Signed: By: Ashley Royalty M.D. On: 09/23/2017 21:34    Pending Labs Unresulted Labs (From admission, onward)   Start     Ordered   Signed and Occupational hygienist morning,   R     Signed and Held   Signed and Held  CBC  Tomorrow morning,   R     Signed and Held      Vitals/Pain Today's  Vitals   09/23/17 2300 09/23/17 2330 09/24/17 0000 09/24/17 0018  BP: (!) 164/75 (!) 144/65 (!) 155/85   Pulse: 98 92 (!) 105   Resp: (!) '21 16 18   ' Temp:      TempSrc:      SpO2: 97% 95% 95%   PainSc:    4     Isolation Precautions No active isolations  Medications Medications  piperacillin-tazobactam (ZOSYN) IVPB 3.375 g (0 g Intravenous Stopped 09/23/17 2333)  sodium chloride 0.9 % bolus 500 mL (0 mLs Intravenous Stopped 09/24/17 0015)  fentaNYL (SUBLIMAZE) injection 50 mcg (50 mcg Intravenous Given 09/23/17 2303)  ondansetron (ZOFRAN) injection 4 mg (4 mg Intravenous Given 09/23/17 2302)    Mobility walks

## 2017-09-24 NOTE — Progress Notes (Signed)
Perforated Diverticulitis  Subjective: Pain better, no nausea  Objective: Vital signs in last 24 hours: Temp:  [98.4 F (36.9 C)-100 F (37.8 C)] 100 F (37.8 C) (04/06 0630) Pulse Rate:  [92-105] 97 (04/06 0630) Resp:  [16-21] 16 (04/06 0630) BP: (144-168)/(65-85) 168/77 (04/06 0630) SpO2:  [95 %-98 %] 96 % (04/06 0630) Weight:  [100.5 kg (221 lb 9 oz)] 100.5 kg (221 lb 9 oz) (04/06 0136) Last BM Date: 09/23/17  Intake/Output from previous day: 04/05 0701 - 04/06 0700 In: 913.3 [I.V.:263.3; IV Piggyback:650] Out: 800 [Urine:800] Intake/Output this shift: No intake/output data recorded.  General appearance: alert and cooperative GI: normal findings: soft, distended, TTP suprapubically, no severe peritoneal signs  Lab Results:  Results for orders placed or performed during the hospital encounter of 09/23/17 (from the past 24 hour(s))  CBC with Differential     Status: Abnormal   Collection Time: 09/23/17 11:04 PM  Result Value Ref Range   WBC 16.6 (H) 4.0 - 10.5 K/uL   RBC 3.55 (L) 3.87 - 5.11 MIL/uL   Hemoglobin 10.5 (L) 12.0 - 15.0 g/dL   HCT 32.0 (L) 36.0 - 46.0 %   MCV 90.1 78.0 - 100.0 fL   MCH 29.6 26.0 - 34.0 pg   MCHC 32.8 30.0 - 36.0 g/dL   RDW 14.0 11.5 - 15.5 %   Platelets 236 150 - 400 K/uL   Neutrophils Relative % 90 %   Neutro Abs 15.0 (H) 1.7 - 7.7 K/uL   Lymphocytes Relative 5 %   Lymphs Abs 0.8 0.7 - 4.0 K/uL   Monocytes Relative 4 %   Monocytes Absolute 0.6 0.1 - 1.0 K/uL   Eosinophils Relative 1 %   Eosinophils Absolute 0.2 0.0 - 0.7 K/uL   Basophils Relative 0 %   Basophils Absolute 0.0 0.0 - 0.1 K/uL  Comprehensive metabolic panel     Status: Abnormal   Collection Time: 09/23/17 11:04 PM  Result Value Ref Range   Sodium 129 (L) 135 - 145 mmol/L   Potassium 4.1 3.5 - 5.1 mmol/L   Chloride 99 (L) 101 - 111 mmol/L   CO2 22 22 - 32 mmol/L   Glucose, Bld 120 (H) 65 - 99 mg/dL   BUN 8 6 - 20 mg/dL   Creatinine, Ser 0.49 0.44 - 1.00 mg/dL   Calcium 8.3 (L) 8.9 - 10.3 mg/dL   Total Protein 6.4 (L) 6.5 - 8.1 g/dL   Albumin 3.1 (L) 3.5 - 5.0 g/dL   AST 28 15 - 41 U/L   ALT 20 14 - 54 U/L   Alkaline Phosphatase 82 38 - 126 U/L   Total Bilirubin 2.4 (H) 0.3 - 1.2 mg/dL   GFR calc non Af Amer >60 >60 mL/min   GFR calc Af Amer >60 >60 mL/min   Anion gap 8 5 - 15  I-stat Chem 8, ED     Status: Abnormal   Collection Time: 09/23/17 11:15 PM  Result Value Ref Range   Sodium 130 (L) 135 - 145 mmol/L   Potassium 4.0 3.5 - 5.1 mmol/L   Chloride 97 (L) 101 - 111 mmol/L   BUN 6 6 - 20 mg/dL   Creatinine, Ser 0.50 0.44 - 1.00 mg/dL   Glucose, Bld 119 (H) 65 - 99 mg/dL   Calcium, Ion 1.08 (L) 1.15 - 1.40 mmol/L   TCO2 24 22 - 32 mmol/L   Hemoglobin 10.5 (L) 12.0 - 15.0 g/dL   HCT 31.0 (L) 36.0 - 46.0 %  Basic metabolic panel     Status: Abnormal   Collection Time: 09/24/17  4:34 AM  Result Value Ref Range   Sodium 134 (L) 135 - 145 mmol/L   Potassium 4.0 3.5 - 5.1 mmol/L   Chloride 101 101 - 111 mmol/L   CO2 25 22 - 32 mmol/L   Glucose, Bld 118 (H) 65 - 99 mg/dL   BUN 8 6 - 20 mg/dL   Creatinine, Ser 0.56 0.44 - 1.00 mg/dL   Calcium 8.0 (L) 8.9 - 10.3 mg/dL   GFR calc non Af Amer >60 >60 mL/min   GFR calc Af Amer >60 >60 mL/min   Anion gap 8 5 - 15  CBC     Status: Abnormal   Collection Time: 09/24/17  4:34 AM  Result Value Ref Range   WBC 16.4 (H) 4.0 - 10.5 K/uL   RBC 3.24 (L) 3.87 - 5.11 MIL/uL   Hemoglobin 9.6 (L) 12.0 - 15.0 g/dL   HCT 29.4 (L) 36.0 - 46.0 %   MCV 90.7 78.0 - 100.0 fL   MCH 29.6 26.0 - 34.0 pg   MCHC 32.7 30.0 - 36.0 g/dL   RDW 14.1 11.5 - 15.5 %   Platelets 228 150 - 400 K/uL     Studies/Results Radiology     MEDS, Scheduled . calcium-vitamin D  1 tablet Oral Daily  . cholecalciferol  2,000 Units Oral QPM  . docusate sodium  100 mg Oral BID  . enoxaparin (LOVENOX) injection  40 mg Subcutaneous Q24H  . gabapentin  300 mg Oral QHS  . levothyroxine  125 mcg Oral Daily  . lisinopril  40  mg Oral Daily  . pantoprazole  40 mg Oral Daily  . polyvinyl alcohol  1 drop Both Eyes BID  . simvastatin  20 mg Oral q1800  . spironolactone  25 mg Oral Daily     Assessment: Perforated diverticulitis  Plan: Cont IV Abx Cont IVF's PT for knee replacement Cont NPO Recheck labs in AM   LOS: 1 day    Rosario Adie, MD [facility] Surgery, [place] [phone]   09/24/2017 7:59 AM

## 2017-09-24 NOTE — Evaluation (Signed)
Physical Therapy Evaluation Patient Details Name: Barbara Hernandez MRN: [id number] DOB: 18-Aug-1940 Today's Date: 09/24/2017   History of Present Illness  Pt is a 77 y/o female  in with abdominal pain found to have diverticulitis and possible perforation. recent s/p elective L TKA on 09/19/2017. PMH includes HTN, R TKA, OSA on CPAP, and mitral incompetence.   Clinical Impression  Pt with recent TKA 09/19/2017 however in with diverticulitis, present with her mobility and knee healing and progressing nicely. While in acute care encourage walking with family and staff and will work with pt on higher level knee strengthening and ROM.     Follow Up Recommendations Home health PT(continue with [facility] as she was prior to this admission )    Equipment Recommendations  None recommended by PT    Recommendations for Other Services       Precautions / Restrictions Precautions Precautions: Knee Precaution Booklet Issued: Yes (comment) Precaution Comments: Reviewed knee precautions  Restrictions Weight Bearing Restrictions: Yes LLE Weight Bearing: Weight bearing as tolerated      Mobility  Bed Mobility Overal bed mobility: Independent                Transfers Overall transfer level: Modified independent Equipment used: Rolling walker (2 wheeled) Transfers: Sit to/from Stand Sit to Stand: Supervision            Ambulation/Gait Ambulation/Gait assistance: Supervision Ambulation Distance (Feet): 300 Feet Assistive device: Rolling walker (2 wheeled) Gait Pattern/deviations: Step-through pattern     General Gait Details: pt very steady and made her use the walker to assure she had fluid step through pattern with heel strike and appropirate knee extension and flexion during gait. Looks very close to normal gait pattern, and tried without RW and was slightly harder but not a lot. Pt doing very well.   Stairs            Wheelchair Mobility    Modified Rankin (Stroke Patients  Only)       Balance Overall balance assessment: Independent Sitting-balance support: No upper extremity supported Sitting balance-Leahy Scale: Good     Standing balance support: No upper extremity supported Standing balance-Leahy Scale: Good                               Pertinent Vitals/Pain Pain Score: 2  Pain Location: abdominal pressure and some back pain , has improved and increased with sitting position and pressure  Pain Intervention(s): Monitored during session    Home Living Family/patient expects to be discharged to:: Private residence Living Arrangements: Alone Available Help at Discharge: Available PRN/intermittently Type of Home: House Home Access: Stairs to enter Entrance Stairs-Rails: Right Entrance Stairs-Number of Steps: 4 Home Layout: One level Home Equipment: Grab bars - tub/shower;Bedside commode;Walker - 2 wheels      Prior Function Level of Independence: Independent         Comments: has been doing well at home with [facility] providing their first PT visit since her TKA on 09/19/2017.      Hand Dominance   Dominant Hand: Left    Extremity/Trunk Assessment        Lower Extremity Assessment Lower Extremity Assessment: Overall WFL for tasks assessed LLE Deficits / Details: L kne ROM grossly 0-95 in sitting        Communication   Communication: No difficulties  Cognition Arousal/Alertness: Awake/alert Behavior During Therapy: WFL for tasks assessed/performed Overall Cognitive Status:  Within Functional Limits for tasks assessed                                        General Comments      Exercises Total Joint Exercises Short Arc Quad: AROM;Left;10 reps Heel Slides: AROM;Left;10 reps;Supine Hip ABduction/ADduction: AROM;Left;10 reps Straight Leg Raises: AROM;Left;10 reps Long Arc Quad: AROM;Left;10 reps Knee Flexion: AROM;Left;Other (comment);10 reps(with holds in flexion sitting EOB for knee  flexion strethc ) Goniometric ROM: 0-95   Assessment/Plan    PT Assessment Patient needs continued PT services  PT Problem List Decreased range of motion;Decreased strength;Decreased mobility       PT Treatment Interventions Gait training;Therapeutic exercise;Patient/family education    PT Goals (Current goals can be found in the Care Plan section)  Acute Rehab PT Goals Patient Stated Goal: to feel better in her abdomen PT Goal Formulation: With patient Time For Goal Achievement: 10/08/17 Potential to Achieve Goals: Good    Frequency Min 4X/week   Barriers to discharge        Co-evaluation               AM-PAC PT "6 Clicks" Daily Activity  Outcome Measure Difficulty turning over in bed (including adjusting bedclothes, sheets and blankets)?: None Difficulty moving from lying on back to sitting on the side of the bed? : None Difficulty sitting down on and standing up from a chair with arms (e.g., wheelchair, bedside commode, etc,.)?: None Help needed moving to and from a bed to chair (including a wheelchair)?: None Help needed walking in hospital room?: None Help needed climbing 3-5 steps with a railing? : A Little 6 Click Score: 23    End of Session   Activity Tolerance: Patient tolerated treatment well Patient left: in bed;with call bell/phone within reach;with family/visitor present Nurse Communication: Mobility status(approved for pt to walkw ith RW and family and/or nursing ) PT Visit Diagnosis: Other abnormalities of gait and mobility (R26.89)    Time: 9678-9381 PT Time Calculation (min) (ACUTE ONLY): 32 min   Charges:   PT Evaluation $PT Eval Low Complexity: 1 Low PT Treatments $Gait Training: 8-22 mins $Therapeutic Exercise: 8-22 mins   PT G Codes:        Clide Dales, PT Pager: [phone] 09/24/2017   Chiyo Fay, Gatha Mayer 09/24/2017, 7:15 PM

## 2017-09-25 LAB — BASIC METABOLIC PANEL
ANION GAP: 7 (ref 5–15)
BUN: 7 mg/dL (ref 6–20)
CO2: 25 mmol/L (ref 22–32)
Calcium: 8.2 mg/dL — ABNORMAL LOW (ref 8.9–10.3)
Chloride: 102 mmol/L (ref 101–111)
Creatinine, Ser: 0.52 mg/dL (ref 0.44–1.00)
GFR calc Af Amer: >60 mL/min (ref >60)
GLUCOSE: 131 mg/dL — AB (ref 65–99)
Potassium: 4.2 mmol/L (ref 3.5–5.1)
Sodium: 134 mmol/L — ABNORMAL LOW (ref 135–145)

## 2017-09-25 LAB — CBC
HEMATOCRIT: 30 % — AB (ref 36.0–46.0)
Hemoglobin: 9.6 g/dL — ABNORMAL LOW (ref 12.0–15.0)
MCH: 29.4 pg (ref 26.0–34.0)
MCHC: 32 g/dL (ref 30.0–36.0)
MCV: 91.7 fL (ref 78.0–100.0)
Platelets: 260 10*3/uL (ref 150–400)
RBC: 3.27 MIL/uL — ABNORMAL LOW (ref 3.87–5.11)
RDW: 14.3 % (ref 11.5–15.5)
WBC: 12.1 10*3/uL — AB (ref 4.0–10.5)

## 2017-09-25 NOTE — Progress Notes (Signed)
Perforated Diverticulitis  Subjective: Pain slightly better, no nausea, having flatus  Objective: Vital signs in last 24 hours: Temp:  [97.7 F (36.5 C)-99.4 F (37.4 C)] 98.6 F (37 C) (04/07 0625) Pulse Rate:  [72-91] 83 (04/07 0625) Resp:  [18-20] 20 (04/07 0625) BP: (103-165)/(59-96) 165/80 (04/07 0625) SpO2:  [95 %-100 %] 98 % (04/07 0625) Last BM Date: 09/24/17  Intake/Output from previous day: 04/06 0701 - 04/07 0700 In: 3060 [P.O.:60; I.V.:2600; IV Piggyback:400] Out: 1401 [Urine:1400; Stool:1] Intake/Output this shift: No intake/output data recorded.  General appearance: alert and cooperative GI: normal findings: soft, distended, TTP suprapubically, no severe peritoneal signs  Lab Results:  Results for orders placed or performed during the hospital encounter of 09/23/17 (from the past 24 hour(s))  CBC     Status: Abnormal   Collection Time: 09/25/17  4:35 AM  Result Value Ref Range   WBC 12.1 (H) 4.0 - 10.5 K/uL   RBC 3.27 (L) 3.87 - 5.11 MIL/uL   Hemoglobin 9.6 (L) 12.0 - 15.0 g/dL   HCT 30.0 (L) 36.0 - 46.0 %   MCV 91.7 78.0 - 100.0 fL   MCH 29.4 26.0 - 34.0 pg   MCHC 32.0 30.0 - 36.0 g/dL   RDW 14.3 11.5 - 15.5 %   Platelets 260 150 - 400 K/uL  Basic metabolic panel     Status: Abnormal   Collection Time: 09/25/17  4:35 AM  Result Value Ref Range   Sodium 134 (L) 135 - 145 mmol/L   Potassium 4.2 3.5 - 5.1 mmol/L   Chloride 102 101 - 111 mmol/L   CO2 25 22 - 32 mmol/L   Glucose, Bld 131 (H) 65 - 99 mg/dL   BUN 7 6 - 20 mg/dL   Creatinine, Ser 0.52 0.44 - 1.00 mg/dL   Calcium 8.2 (L) 8.9 - 10.3 mg/dL   GFR calc non Af Amer >60 >60 mL/min   GFR calc Af Amer >60 >60 mL/min   Anion gap 7 5 - 15     Studies/Results Radiology     MEDS, Scheduled . calcium-vitamin D  1 tablet Oral Daily  . cholecalciferol  2,000 Units Oral QPM  . docusate sodium  100 mg Oral BID  . enoxaparin (LOVENOX) injection  40 mg Subcutaneous Q24H  . gabapentin  300 mg  Oral QHS  . levothyroxine  125 mcg Oral Daily  . lisinopril  40 mg Oral Daily  . pantoprazole  40 mg Oral Daily  . polyvinyl alcohol  1 drop Both Eyes BID  . simvastatin  20 mg Oral q1800  . spironolactone  25 mg Oral Daily     Assessment: Perforated diverticulitis  Plan: Cont IV Abx Cont IVF's, decrease rate Cont PT for knee replacement Start clears Recheck labs in AM   LOS: 2 days    Rosario Adie, MD [facility], [place] [phone]   09/25/2017 8:15 AM

## 2017-09-26 DIAGNOSIS — K5720 Diverticulitis of large intestine with perforation and abscess without bleeding: Secondary | ICD-10-CM

## 2017-09-26 HISTORY — DX: Diverticulitis of large intestine with perforation and abscess without bleeding: K57.20

## 2017-09-26 LAB — CBC
HCT: 30.7 % — ABNORMAL LOW (ref 36.0–46.0)
HEMOGLOBIN: 9.7 g/dL — AB (ref 12.0–15.0)
MCH: 28.9 pg (ref 26.0–34.0)
MCHC: 31.6 g/dL (ref 30.0–36.0)
MCV: 91.4 fL (ref 78.0–100.0)
Platelets: 296 10*3/uL (ref 150–400)
RBC: 3.36 MIL/uL — ABNORMAL LOW (ref 3.87–5.11)
RDW: 14.3 % (ref 11.5–15.5)
WBC: 10.4 10*3/uL (ref 4.0–10.5)

## 2017-09-26 NOTE — Progress Notes (Signed)
[facility]/Trauma Progress Note      Assessment/Plan Sigmoid thickening with diverticula with free intraperitoneal air - treatment same as diverticulitis - continue IV abx  Left knee replacement - POD 7, 04/01, Dr. Noemi Chapel will see pt today, per pt  FEN: fulls VTE: SCD's, lovenox ID: Zosyn 04/05 once; Rocephin & Flagyl 04/06>>, WBC 10.4 04/08 Foley: none Follow up: TBD  DISPO: pt improving, will advance diet to fulls, AM CBC    LOS: 3 days    Subjective: CC: suprapubic pain  Pt states her pain is greatly improved and she is feeling much better. She has only been taking tylenol except last night she took Morphine. Pain only with sitting up or some movements. Tolerating diet and no nausea or vomiting overnight. No fever or chills overnight. Pt states last colonoscopy roughly 9 years ago and no mention of diverticula on report, per pt.   Objective: Vital signs in last 24 hours: Temp:  [98.4 F (36.9 C)-99.3 F (37.4 C)] 99.3 F (37.4 C) (04/08 0549) Pulse Rate:  [68-88] 83 (04/08 0549) Resp:  [16-20] 18 (04/08 0549) BP: (147-181)/(58-101) 159/79 (04/08 0549) SpO2:  [98 %-100 %] 100 % (04/08 0549) Last BM Date: 09/25/17  Intake/Output from previous day: 04/07 0701 - 04/08 0700 In: 3102.5 [P.O.:1370; I.V.:1332.5; IV Piggyback:400] Out: 6962 [Urine:3400; Stool:77] Intake/Output this shift: No intake/output data recorded.  PE: Gen:  Alert, NAD, pleasant, cooperative Card:  RRR, no M/G/R heard Pulm:  CTA, no W/R/R, rate and effort normal Abd: Soft, NT/ND, +BS  Skin: no rashes noted, warm and dry   Anti-infectives: Anti-infectives (From admission, onward)   Start     Dose/Rate Route Frequency Ordered Stop   09/24/17 0600  metroNIDAZOLE (FLAGYL) IVPB 500 mg     500 mg 100 mL/hr over 60 Minutes Intravenous Every 8 hours 09/24/17 0101     09/24/17 0400  cefTRIAXone (ROCEPHIN) 2 g in sodium chloride 0.9 % 100 mL IVPB     2 g 200 mL/hr over 30 Minutes  Intravenous Daily 09/24/17 0101     09/23/17 2215  piperacillin-tazobactam (ZOSYN) IVPB 3.375 g     3.375 g 100 mL/hr over 30 Minutes Intravenous  Once 09/23/17 2208 09/23/17 2333      Lab Results:  Recent Labs    09/25/17 0435 09/26/17 0437  WBC 12.1* 10.4  HGB 9.6* 9.7*  HCT 30.0* 30.7*  PLT 260 296   BMET Recent Labs    09/24/17 0434 09/25/17 0435  NA 134* 134*  K 4.0 4.2  CL 101 102  CO2 25 25  GLUCOSE 118* 131*  BUN 8 7  CREATININE 0.56 0.52  CALCIUM 8.0* 8.2*   PT/INR No results for input(s): LABPROT, INR in the last 72 hours. CMP     Component Value Date/Time   NA 134 (L) 09/25/2017 0435   NA 135 09/09/2015 0843   K 4.2 09/25/2017 0435   CL 102 09/25/2017 0435   CO2 25 09/25/2017 0435   GLUCOSE 131 (H) 09/25/2017 0435   BUN 7 09/25/2017 0435   BUN 12 09/09/2015 0843   CREATININE 0.52 09/25/2017 0435   CALCIUM 8.2 (L) 09/25/2017 0435   PROT 6.4 (L) 09/23/2017 2304   PROT 6.4 09/09/2015 0843   ALBUMIN 3.1 (L) 09/23/2017 2304   ALBUMIN 4.3 09/09/2015 0843   AST 28 09/23/2017 2304   ALT 20 09/23/2017 2304   ALKPHOS 82 09/23/2017 2304   BILITOT 2.4 (H) 09/23/2017 2304   BILITOT 1.0 09/09/2015  [place] >60 09/25/2017 0435   GFRAA >60 09/25/2017 0435   Lipase  No results found for: LIPASE  Studies/Results: No results found.    Kalman Drape , [facility] 09/26/2017, 9:08 AM  Pager: [phone] Mon-Wed, Friday 7:00am-4:30pm Thurs 7am-11:30am  Consults: [phone]

## 2017-09-26 NOTE — Progress Notes (Signed)
Physical Therapy Treatment Patient Details Name: Barbara Hernandez MRN: [id number] DOB: March 10, 1941 Today's Date: 09/26/2017    History of Present Illness  Diverticulitis s/p l tkr 09/19/2017    PT Comments    Pt OOB in recliner.  Assisted with applying B TEDS and instructed on use.  Assisted with amb in hallway with instruction to equal stance time B LE and increase L heel strike/toe off.  Pt tolerated well.  Use of walker minimal for balance.  Returned to room to perform some TKR TE's followed by ICE.  L knee ROM approx 0 - 110 degrees.    Follow Up Recommendations  Home health PT     Equipment Recommendations  None recommended by PT    Recommendations for Other Services       Precautions / Restrictions Precautions Precautions: Knee Precaution Comments: Reviewed knee precautions  Restrictions Weight Bearing Restrictions: No LLE Weight Bearing: Weight bearing as tolerated    Mobility  Bed Mobility Overal bed mobility: Modified Independent Bed Mobility: Sit to Supine           General bed mobility comments: increased time back to bed  Transfers Overall transfer level: Modified independent Equipment used: None;Rolling walker (2 wheeled) Transfers: Sit to/from [facility] Sit to Stand: Modified independent (Device/Increase time) Stand pivot transfers: Supervision;Modified independent (Device/Increase time)       General transfer comment: good use of hands to steady self.    Ambulation/Gait Ambulation/Gait assistance: Supervision Ambulation Distance (Feet): 185 Feet Assistive device: Rolling walker (2 wheeled);None Gait Pattern/deviations: Step-through pattern Gait velocity: WFL   General Gait Details: 25% VC's to "equal" stance time B LE and increase L knee flex(heel strike/toe off).  Amb with walker in hallway and without in bathroom.     Stairs            Wheelchair Mobility    Modified Rankin (Stroke Patients Only)       Balance                                             Cognition Arousal/Alertness: Awake/alert Behavior During Therapy: WFL for tasks assessed/performed Overall Cognitive Status: Within Functional Limits for tasks assessed                                        Exercises   Total Knee Replacement TE's 10 reps B LE ankle pumps 10 reps knee presses 10 reps heel slides  10 reps  LAQ's  10 reps SLR's  Followed by ICE     General Comments        Pertinent Vitals/Pain Pain Assessment: No/denies pain    Home Living                      Prior Function            PT Goals (current goals can now be found in the care plan section) Progress towards PT goals: Progressing toward goals    Frequency    Min 4X/week      PT Plan Current plan remains appropriate    Co-evaluation              AM-PAC PT "6 Clicks" Daily Activity  Outcome Measure  Difficulty turning over in bed (  including adjusting bedclothes, sheets and blankets)?: None Difficulty moving from lying on back to sitting on the side of the bed? : None Difficulty sitting down on and standing up from a chair with arms (e.g., wheelchair, bedside commode, etc,.)?: None Help needed moving to and from a bed to chair (including a wheelchair)?: None   Help needed climbing 3-5 steps with a railing? : A Little 6 Click Score: 19    End of Session Equipment Utilized During Treatment: Gait belt Activity Tolerance: Patient tolerated treatment well Patient left: in bed;with call bell/phone within reach;with family/visitor present Nurse Communication: Mobility status PT Visit Diagnosis: Other abnormalities of gait and mobility (R26.89) Pain - Right/Left: Left Pain - part of body: Knee     Time: 8887-5797 PT Time Calculation (min) (ACUTE ONLY): 34 min  Charges:  $Gait Training: 8-22 mins $Therapeutic Exercise: 8-22 mins                    G Codes:       Rica Koyanagi  PTA [facility]   Acute  Rehab Pager      [phone]

## 2017-09-26 NOTE — Care Management Important Message (Signed)
Important Message  Patient Details  Name: KAILEAH SHEVCHENKO MRN: [id number] Date of Birth: 03-30-1941   Medicare Important Message Given:  Yes    Kerin Salen 09/26/2017, 10:08 AMImportant Message  Patient Details  Name: HOWARD PATTON MRN: [id number] Date of Birth: [birth year]   Medicare Important Message Given:  Yes    Kerin Salen 09/26/2017, 10:08 AM

## 2017-09-27 LAB — URINALYSIS, ROUTINE W REFLEX MICROSCOPIC
BILIRUBIN URINE: NEGATIVE
Glucose, UA: NEGATIVE mg/dL
Hgb urine dipstick: NEGATIVE
KETONES UR: NEGATIVE mg/dL
Leukocytes, UA: NEGATIVE
NITRITE: NEGATIVE
Protein, ur: NEGATIVE mg/dL
Specific Gravity, Urine: 1.005 (ref 1.005–1.030)
pH: 7 (ref 5.0–8.0)

## 2017-09-27 LAB — CBC
HCT: 32 % — ABNORMAL LOW (ref 36.0–46.0)
Hemoglobin: 10.6 g/dL — ABNORMAL LOW (ref 12.0–15.0)
MCH: 29.8 pg (ref 26.0–34.0)
MCHC: 33.1 g/dL (ref 30.0–36.0)
MCV: 89.9 fL (ref 78.0–100.0)
Platelets: 333 10*3/uL (ref 150–400)
RBC: 3.56 MIL/uL — ABNORMAL LOW (ref 3.87–5.11)
RDW: 13.9 % (ref 11.5–15.5)
WBC: 14.6 10*3/uL — ABNORMAL HIGH (ref 4.0–10.5)

## 2017-09-27 MED ORDER — METOPROLOL TARTRATE 5 MG/5ML IV SOLN: 5.0000 mg | Freq: Four times a day (QID) | INTRAVENOUS | Status: DC | PRN

## 2017-09-27 MED ORDER — HYDRALAZINE HCL 20 MG/ML IJ SOLN: 5.0000 mg | INTRAMUSCULAR | Status: DC | PRN

## 2017-09-27 NOTE — Progress Notes (Signed)
Physical Therapy Treatment Patient Details Name: Barbara Hernandez MRN: [id number] DOB: 23-May-1941 Today's Date: 09/27/2017    History of Present Illness Pt is a 77 y/o female  in with abdominal pain found to have diverticulitis and possible perforation. recent s/p elective L TKA on 09/19/2017. PMH includes HTN, R TKA, OSA on CPAP, and mitral incompetence.     PT Comments    Assisted to bathroom then amb in hallway with walker for mild gait instability.  Pt able to amb in room without walker but tends to hold to furniture.  Returned to room and performed TKR TE's.  L knee ROM approx 0 - 125 degrees.    Follow Up Recommendations  Home health PT     Equipment Recommendations  None recommended by PT    Recommendations for Other Services       Precautions / Restrictions Precautions Precautions: Knee Restrictions Weight Bearing Restrictions: No LLE Weight Bearing: Weight bearing as tolerated    Mobility  Bed Mobility Overal bed mobility: Modified Independent             General bed mobility comments: increased time back to bed  Transfers Overall transfer level: Modified independent Equipment used: None;Rolling walker (2 wheeled) Transfers: Sit to/from [facility] Sit to Stand: Modified independent (Device/Increase time) Stand pivot transfers: Supervision;Modified independent (Device/Increase time)       General transfer comment: good use of hands to steady self.    Ambulation/Gait Ambulation/Gait assistance: Supervision Ambulation Distance (Feet): 300 Feet Assistive device: Rolling walker (2 wheeled);None Gait Pattern/deviations: Step-through pattern Gait velocity: WFL   General Gait Details: 25% VC's to "equal" stance time B LE and increase L knee flex(heel strike/toe off).  Amb with walker in hallway and without in bathroom.     Stairs            Wheelchair Mobility    Modified Rankin (Stroke Patients Only)       Balance                                            Cognition Arousal/Alertness: Awake/alert Behavior During Therapy: WFL for tasks assessed/performed Overall Cognitive Status: Within Functional Limits for tasks assessed                                        Exercises   Total Knee Replacement TE's 10 reps B LE ankle pumps 10 reps towel squeezes 10 reps knee presses 10 reps heel slides  10 reps SAQ's 10 reps SLR's 10 reps ABD Followed by ICE     General Comments        Pertinent Vitals/Pain Pain Assessment: No/denies pain Pain Location: mild L knee tightness Pain Descriptors / Indicators: Tightness Pain Intervention(s): Monitored during session;Repositioned    Home Living                      Prior Function            PT Goals (current goals can now be found in the care plan section) Progress towards PT goals: Progressing toward goals    Frequency    Min 4X/week      PT Plan Current plan remains appropriate    Co-evaluation  AM-PAC PT "6 Clicks" Daily Activity  Outcome Measure  Difficulty turning over in bed (including adjusting bedclothes, sheets and blankets)?: None Difficulty moving from lying on back to sitting on the side of the bed? : None Difficulty sitting down on and standing up from a chair with arms (e.g., wheelchair, bedside commode, etc,.)?: None Help needed moving to and from a bed to chair (including a wheelchair)?: None Help needed walking in hospital room?: None Help needed climbing 3-5 steps with a railing? : A Little 6 Click Score: 23    End of Session Equipment Utilized During Treatment: Gait belt Activity Tolerance: Patient tolerated treatment well Patient left: in bed;with call bell/phone within reach;with family/visitor present Nurse Communication: Mobility status PT Visit Diagnosis: Other abnormalities of gait and mobility (R26.89) Pain - Right/Left: Left     Time: 9604-5409 PT  Time Calculation (min) (ACUTE ONLY): 28 min  Charges:  $Gait Training: 8-22 mins $Therapeutic Exercise: 8-22 mins                    G Codes:       Rica Koyanagi  PTA [facility]  Acute  Rehab Pager      [phone]

## 2017-09-27 NOTE — Progress Notes (Signed)
[facility]/Trauma Progress Note      Assessment/Plan Sigmoid thickening with diverticula with free intraperitoneal air - treatment same as diverticulitis - continue IV abx  Left knee replacement - POD 8, 04/01, Dr. Noemi Chapel will see pt today, per pt  FEN: fulls VTE: SCD's, lovenox ID: Zosyn 04/05 once; Rocephin & Flagyl 04/06>>, WBC 10.4 04/08 Foley: none Follow up: TBD  DISPO: pt improving, will advance diet to soft, CBC pending, afebrile, possibly home tomorrow   LOS: 4 days    Subjective: CC: abdominal pain and diarrhea  Pain is improved. Diarrhea today. She states she is feeling better than yesterday. Having persistent chills at night. Tolerating diet and no nausea or vomiting overnight. No fever overnight. No family at bedside.  Objective: Vital signs in last 24 hours: Temp:  [97.9 F (36.6 C)-99.6 F (37.6 C)] 99.6 F (37.6 C) (04/09 0453) Pulse Rate:  [77-81] 79 (04/09 0453) Resp:  [16-22] 18 (04/09 0453) BP: (144-181)/(57-91) 144/57 (04/09 0453) SpO2:  [97 %-100 %] 97 % (04/09 0453) Last BM Date: 09/26/17  Intake/Output from previous day: 04/08 0701 - 04/09 0700 In: 2660 [P.O.:1260; I.V.:1000; IV Piggyback:400] Out: -  Intake/Output this shift: No intake/output data recorded.  PE: Gen:  Alert, NAD, pleasant, cooperative Card:  RRR, no M/G/R heard Pulm:  CTA, no W/R/R, rate and effort normal Abd: Soft, ND, +BS, very mild TTP in LLQ Skin: no rashes noted, warm and dry   Anti-infectives: Anti-infectives (From admission, onward)   Start     Dose/Rate Route Frequency Ordered Stop   09/24/17 0600  metroNIDAZOLE (FLAGYL) IVPB 500 mg     500 mg 100 mL/hr over 60 Minutes Intravenous Every 8 hours 09/24/17 0101     09/24/17 0400  cefTRIAXone (ROCEPHIN) 2 g in sodium chloride 0.9 % 100 mL IVPB     2 g 200 mL/hr over 30 Minutes Intravenous Daily 09/24/17 0101     09/23/17 2215  piperacillin-tazobactam (ZOSYN) IVPB 3.375 g     3.375 g 100  mL/hr over 30 Minutes Intravenous  Once 09/23/17 2208 09/23/17 2333      Lab Results:  Recent Labs    09/26/17 0437 09/27/17 0756  WBC 10.4 14.6*  HGB 9.7* 10.6*  HCT 30.7* 32.0*  PLT 296 333   BMET Recent Labs    09/25/17 0435  NA 134*  K 4.2  CL 102  CO2 25  GLUCOSE 131*  BUN 7  CREATININE 0.52  CALCIUM 8.2*   PT/INR No results for input(s): LABPROT, INR in the last 72 hours. CMP     Component Value Date/Time   NA 134 (L) 09/25/2017 0435   NA 135 09/09/2015 0843   K 4.2 09/25/2017 0435   CL 102 09/25/2017 0435   CO2 25 09/25/2017 0435   GLUCOSE 131 (H) 09/25/2017 0435   BUN 7 09/25/2017 0435   BUN 12 09/09/2015 0843   CREATININE 0.52 09/25/2017 0435   CALCIUM 8.2 (L) 09/25/2017 0435   PROT 6.4 (L) 09/23/2017 2304   PROT 6.4 09/09/2015 0843   ALBUMIN 3.1 (L) 09/23/2017 2304   ALBUMIN 4.3 09/09/2015 0843   AST 28 09/23/2017 2304   ALT 20 09/23/2017 2304   ALKPHOS 82 09/23/2017 2304   BILITOT 2.4 (H) 09/23/2017 2304   BILITOT 1.0 09/09/2015 0843   GFRNONAA >60 09/25/2017 0435   GFRAA >60 09/25/2017 0435   Lipase  No results found for: LIPASE  Studies/Results: No results found.    Kalman Drape ,  [facility] 09/27/2017, 8:17 AM  Pager: [phone] Mon-Wed, Friday 7:00am-4:30pm Thurs 7am-11:30am  Consults: [phone]

## 2017-09-28 ENCOUNTER — Emergency Department (HOSPITAL_COMMUNITY)
Admission: EM | Admit: 2017-09-28 | Discharge: 2017-09-28 | Disposition: A | Discharge status: Home / Self Care | Payer: Medicare Other | Attending: Emergency Medicine | Admitting: Emergency Medicine

## 2017-09-28 ENCOUNTER — Emergency Department (HOSPITAL_COMMUNITY): Payer: Medicare Other

## 2017-09-28 DIAGNOSIS — R079 Chest pain, unspecified: Secondary | ICD-10-CM

## 2017-09-28 DIAGNOSIS — Z87891 Personal history of nicotine dependence: Secondary | ICD-10-CM | POA: Insufficient documentation

## 2017-09-28 DIAGNOSIS — Z96653 Presence of artificial knee joint, bilateral: Secondary | ICD-10-CM | POA: Insufficient documentation

## 2017-09-28 DIAGNOSIS — Z7982 Long term (current) use of aspirin: Secondary | ICD-10-CM | POA: Insufficient documentation

## 2017-09-28 DIAGNOSIS — E039 Hypothyroidism, unspecified: Secondary | ICD-10-CM | POA: Insufficient documentation

## 2017-09-28 DIAGNOSIS — Z79899 Other long term (current) drug therapy: Secondary | ICD-10-CM | POA: Insufficient documentation

## 2017-09-28 DIAGNOSIS — I10 Essential (primary) hypertension: Secondary | ICD-10-CM | POA: Insufficient documentation

## 2017-09-28 LAB — CBC WITH DIFFERENTIAL/PLATELET
BASOS PCT: 0 %
Basophils Absolute: 0 10*3/uL (ref 0.0–0.1)
EOS PCT: 2 %
Eosinophils Absolute: 0.3 10*3/uL (ref 0.0–0.7)
HEMATOCRIT: 33.7 % — AB (ref 36.0–46.0)
Hemoglobin: 10.9 g/dL — ABNORMAL LOW (ref 12.0–15.0)
LYMPHS ABS: 1.6 10*3/uL (ref 0.7–4.0)
Lymphocytes Relative: 11 %
MCH: 29.1 pg (ref 26.0–34.0)
MCHC: 32.3 g/dL (ref 30.0–36.0)
MCV: 89.9 fL (ref 78.0–100.0)
MONO ABS: 0.7 10*3/uL (ref 0.1–1.0)
Monocytes Relative: 5 %
Neutro Abs: 11.6 10*3/uL — ABNORMAL HIGH (ref 1.7–7.7)
Neutrophils Relative %: 82 %
Platelets: 399 10*3/uL (ref 150–400)
RBC: 3.75 MIL/uL — ABNORMAL LOW (ref 3.87–5.11)
RDW: 14.5 % (ref 11.5–15.5)
WBC: 14.2 10*3/uL — ABNORMAL HIGH (ref 4.0–10.5)

## 2017-09-28 LAB — COMPREHENSIVE METABOLIC PANEL
ALT: 16 U/L (ref 14–54)
ANION GAP: 9 (ref 5–15)
AST: 27 U/L (ref 15–41)
Albumin: 3.2 g/dL — ABNORMAL LOW (ref 3.5–5.0)
Alkaline Phosphatase: 92 U/L (ref 38–126)
BUN: 6 mg/dL (ref 6–20)
CO2: 23 mmol/L (ref 22–32)
Calcium: 8.8 mg/dL — ABNORMAL LOW (ref 8.9–10.3)
Chloride: 98 mmol/L — ABNORMAL LOW (ref 101–111)
Creatinine, Ser: 0.67 mg/dL (ref 0.44–1.00)
GFR calc Af Amer: >60 mL/min (ref >60)
Glucose, Bld: 107 mg/dL — ABNORMAL HIGH (ref 65–99)
POTASSIUM: 4.6 mmol/L (ref 3.5–5.1)
Sodium: 130 mmol/L — ABNORMAL LOW (ref 135–145)
TOTAL PROTEIN: 6.4 g/dL — AB (ref 6.5–8.1)
Total Bilirubin: 1 mg/dL (ref 0.3–1.2)

## 2017-09-28 LAB — CBC
HEMATOCRIT: 31.8 % — AB (ref 36.0–46.0)
HEMOGLOBIN: 10.2 g/dL — AB (ref 12.0–15.0)
MCH: 29.1 pg (ref 26.0–34.0)
MCHC: 32.1 g/dL (ref 30.0–36.0)
MCV: 90.6 fL (ref 78.0–100.0)
Platelets: 341 10*3/uL (ref 150–400)
RBC: 3.51 MIL/uL — ABNORMAL LOW (ref 3.87–5.11)
RDW: 14.1 % (ref 11.5–15.5)
WBC: 12.9 10*3/uL — ABNORMAL HIGH (ref 4.0–10.5)

## 2017-09-28 LAB — URINE CULTURE: Culture: NO GROWTH

## 2017-09-28 LAB — LIPASE, BLOOD: LIPASE: 25 U/L (ref 11–51)

## 2017-09-28 MED ORDER — IOPAMIDOL (ISOVUE-370) INJECTION 76%: 100.0000 mL | Freq: Once | INTRAVENOUS | Status: DC | PRN

## 2017-09-28 MED ORDER — AMOXICILLIN-POT CLAVULANATE 875-125 MG PO TABS: 1.0000 | ORAL_TABLET | Freq: Two times a day (BID) | ORAL | 0 refills | Status: AC

## 2017-09-28 MED ORDER — IOPAMIDOL (ISOVUE-370) INJECTION 76%
INTRAVENOUS | Status: AC
  Administered 2017-09-28: 100 mL
  Filled 2017-09-28: qty 100

## 2017-09-28 MED ORDER — METOPROLOL TARTRATE 5 MG/5ML IV SOLN: 5.0000 mg | Freq: Four times a day (QID) | INTRAVENOUS | Status: DC | PRN

## 2017-09-28 MED ORDER — HYDRALAZINE HCL 20 MG/ML IJ SOLN: 5.0000 mg | INTRAMUSCULAR | Status: DC | PRN

## 2017-09-28 NOTE — ED Notes (Signed)
The pt returned from c-t 

## 2017-09-28 NOTE — ED Provider Notes (Signed)
[place] EMERGENCY DEPARTMENT Provider Note   CSN: [id number] Arrival date & time: 09/28/17  1717     History   Chief Complaint Chief Complaint  Patient presents with  . Chest Pain    HPI Barbara Hernandez is a 77 y.o. female.  Patient presents for evaluation of right lower chest pain, worse with deep breathing, onset suddenly earlier today.  She had been in the hospital for treatment of diverticulitis, when the pain started.  He recalls the beginning after she rolled over and sat up on the bed.  Patient was discharged, but continued to have pain at home, so called her orthopedic provider and was advised to come here for evaluation.  She had total knee replacement, 10 days ago, has been ambulatory and doing well.  She has been eating some in the last 2 days, had some loose bowel movements yesterday, and 2 normal bowel movements earlier today.  She denies nausea, vomiting, fever, chills, cough, shortness of breath, weakness or dizziness.  She is taking her usual prescribed medications.  There are no other known modifying factors.  HPI  Past Medical History:  Diagnosis Date  . Anemia   . Arthritis   . Barrett's esophagus 2012   2010 upper endoscopy showed only reflux. 2012 biopsies suggested Barrett's epithelial changes.  . Bladder tumor   . BPPV (benign paroxysmal positional vertigo) 2004  . Family history of adverse reaction to anesthesia    sister had memory problems after delivery of child  . GERD (gastroesophageal reflux disease)   . H/O measles   . Heart murmur   . Hematuria   . Hematuria   . Hemorrhoid   . Hemorrhoids   . History of chicken pox   . Hyperlipidemia   . Hypertension   . Hypothyroidism   . Joint pain   . Mild left ventricular hypertrophy   . Onychomycosis   . Primary localized osteoarthritis of left knee 09/06/2017  . Reflux   . Reflux esophagitis   . 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 Sleep apnea 2011   Uses C-Pap  machine  . Thyroid disease     Patient Active Problem List   Diagnosis Date Noted  . Perforation of sigmoid colon due to diverticulitis 09/26/2017  . Primary localized osteoarthritis of left knee 09/06/2017  . Primary localized osteoarthrosis of the knee, right 07/28/2016  . Benign essential hypertension 03/18/2015  . 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 Arthritis, degenerative 11/22/2014  . VFD (visual field defect) 11/22/2014  . Hypothyroidism 10/08/2014  . Barrett's esophagus 10/08/2014  . Hypercholesteremia 10/08/2014  . Hypertension 10/08/2014  . Hematuria 10/08/2014  . Onychomycosis 10/08/2014  . Mild left ventricular hypertrophy 10/08/2014  . Pernicious anemia 10/08/2014  . Vitamin D deficiency 10/08/2014  . Vertigo, benign paroxysmal 10/08/2014  . Systolic murmur 62/13/0865  . Sleep apnea 10/08/2014  . Arthritis of shoulder region, right, degenerative 10/08/2014  . Allergic rhinitis 10/08/2014  . 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 MI (mitral incompetence) 08/14/2014  . Esophageal reflux 09/07/2012  . Hemorrhoid     Past Surgical History:  Procedure Laterality Date  . BLEPHAROPLASTY Bilateral 2015  . BREAST EXCISIONAL BIOPSY Left 1988   benign  . COLONOSCOPY W/ BIOPSIES N/A 01/16/2013   No source for GI blood loss, mild lymphatic prominence in the rectum.  . ESOPHAGOGASTRODUODENOSCOPY (EGD) WITH PROPOFOL N/A 03/15/2016   Procedure: ESOPHAGOGASTRODUODENOSCOPY (EGD) WITH PROPOFOL;  Surgeon: Robert Bellow, MD;  Location: [facility];  Service:  Endoscopy;  Laterality: N/A;  . EYE SURGERY Bilateral 2006   cataract  . Left wrist fracture    . POPLITEAL SYNOVIAL CYST EXCISION Right   . TOTAL KNEE ARTHROPLASTY Right 08/09/2016   Procedure: TOTAL KNEE ARTHROPLASTY;  Surgeon: Elsie Saas, MD;  Location: [facility] [place];  Service: Orthopedics;  Laterality: Right;  . TOTAL KNEE ARTHROPLASTY Left 09/19/2017   Procedure: TOTAL KNEE ARTHROPLASTY;  Surgeon: Elsie Saas, MD;   Location: [place];  Service: Orthopedics;  Laterality: Left;  . TRANSURETHRAL RESECTION OF BLADDER TUMOR N/A 11/20/2014   Procedure: TRANSURETHRAL RESECTION OF BLADDER TUMOR (TURBT);  Surgeon: Hollice Espy, MD;  Location: [facility];  Service: Urology;  Laterality: N/A;  . UPPER GI ENDOSCOPY  2012, 2014     OB History    Gravida  2   Para  2   Term      Preterm      AB      Living  2     SAB      TAB      Ectopic      Multiple      Live Births           Obstetric Comments  Age at first menstrual period 84 Age at first pregnancy 27         Home Medications    Prior to Admission medications   Medication Sig Start Date End Date Taking? Authorizing Provider  acetaminophen (TYLENOL) 500 MG tablet Take 500 mg by mouth every 6 (six) hours as needed for mild pain.    Yes [provider]  alum & mag hydroxide-simeth (MYLANTA) 200-200-20 MG/5ML suspension Take 30 mLs by mouth every 6 (six) hours as needed for indigestion or heartburn.   Yes [provider]  amoxicillin-clavulanate (AUGMENTIN) 875-125 MG tablet Take 1 tablet by mouth every 12 (twelve) hours for 10 days. 09/28/17 10/08/17  Kalman Drape, PA  Artificial Tear Solution (GENTEAL TEARS OP) Apply 1 application to eye at bedtime.    [provider]  aspirin EC 325 MG EC tablet 1 tab a day for the next 30 days to prevent blood clots 09/20/17   Shepperson, Kirstin, PA-C  Calcium Carbonate-Vit D-Min (CALCIUM 1200 PO) Take 1 tablet by mouth daily.     [provider]  carboxymethylcellulose (REFRESH PLUS) 0.5 % SOLN Place 1 drop into both eyes every morning.     [provider]  Cholecalciferol (VITAMIN D3) 2000 UNITS TABS Take 1 tablet by mouth every evening. bedtime    [provider]  docusate sodium (COLACE) 100 MG capsule 1 tab 2 times a day while on narcotics.  STOOL SOFTENER 09/20/17   Shepperson, Kirstin, PA-C  gabapentin (NEURONTIN) 300 MG capsule 1 tablet at night  for nerve pain 09/20/17   Shepperson, Kirstin, PA-C  levothyroxine (SYNTHROID, LEVOTHROID) 125 MCG tablet Take 1 tablet (125 mcg total) by mouth daily. 09/11/15   Margarita Rana, MD  lisinopril (PRINIVIL,ZESTRIL) 40 MG tablet Take 1 tablet (40 mg total) by mouth daily. bedtime 05/13/15   Margarita Rana, MD  omeprazole (PRILOSEC) 20 MG capsule Take 1 capsule (20 mg total) by mouth daily. 04/15/15   Margarita Rana, MD  ondansetron (ZOFRAN) 4 MG tablet Take 1 tablet (4 mg total) by mouth every 6 (six) hours as needed for nausea. 08/11/16   Shepperson, Kirstin, PA-C  oxyCODONE (OXY IR/ROXICODONE) 5 MG immediate release tablet 1 po q 4 hrs prn pain.  Patient is s/p left total knee  replacement on  09/20/17 09/20/17   Shepperson, Kirstin, PA-C  polyethylene glycol (MIRALAX / GLYCOLAX) packet Not pertinent to current symptomatology therefore not examined. Patient taking differently: Take 17 g by mouth daily as needed for moderate constipation. Not pertinent to current symptomatology therefore not examined. 09/20/17   Shepperson, Kirstin, PA-C  simvastatin (ZOCOR) 20 MG tablet Take 1 tablet (20 mg total) by mouth daily. bedtime Patient taking differently: Take 20 mg by mouth daily at 6 PM. bedtime 05/13/15   Margarita Rana, MD  spironolactone (ALDACTONE) 25 MG tablet Take 25 mg by mouth daily.    [provider]  vitamin B-12 (CYANOCOBALAMIN) 100 MCG tablet Take 100 mcg by mouth daily.    [provider]    Family History Family History  Problem Relation Age of Onset  . Heart disease Mother        CHF, CAD  . Hypertension Mother   . Mental illness Mother        Dementia  . Cancer Mother        breast, Brain Cancer  . Breast cancer Mother 70  . Cancer Father        lung  . Heart disease Father        MI  . Heart disease Sister   . Cancer Sister        Breast  . Barrett's esophagus Sister   . Hyperlipidemia Sister   . Breast cancer Sister 57  . Asthma Brother   . Heart disease  Brother   . Diabetes Brother   . Anxiety disorder Brother   . Neuropathy Brother   . Neuropathy Sister   . Anxiety disorder Sister   . Mental illness Sister        Dementia  . Fibromyalgia Sister     Social History Social History   Tobacco Use  . Smoking status: Former Smoker    Packs/day: 0.50    Years: 20.00    Pack years: 10.00    Types: Cigarettes    Last attempt to quit: 06/20/1977    Years since quitting: 40.3  . Smokeless tobacco: Never Used  Substance Use Topics  . Alcohol use: Yes    Alcohol/week: 0.6 - 1.8 oz    Types: 1 - 2 Standard drinks or equivalent per week  . Drug use: No     Allergies   Carvedilol and Hctz [hydrochlorothiazide]   Review of Systems Review of Systems  All other systems reviewed and are negative.    Physical Exam Updated Vital Signs BP (!) 169/84   Pulse 94   Temp 98.7 F (37.1 C) (Oral)   Resp 17   SpO2 97%   Physical Exam  Constitutional: She is oriented to person, place, and time. She appears well-developed. She does not appear ill.  Overweight  HENT:  Head: Normocephalic and atraumatic.  Eyes: Pupils are equal, round, and reactive to light. Conjunctivae and EOM are normal.  Neck: Normal range of motion and phonation normal. Neck supple.  Cardiovascular: Normal rate and regular rhythm.  Pulmonary/Chest: Effort normal and breath sounds normal. No respiratory distress. She has no wheezes. She exhibits no tenderness.  Abdominal: Soft. She exhibits no distension. There is no tenderness. There is no guarding.  Musculoskeletal: Normal range of motion.  Neurological: She is alert and oriented to person, place, and time. She exhibits normal muscle tone.  Skin: Skin is warm and dry.  Psychiatric: She has a normal mood and affect. Her behavior is normal.  Judgment and thought content normal.  Nursing note and vitals reviewed.    ED Treatments / Results  Labs (all labs ordered are listed, but only abnormal results are  displayed) Labs Reviewed  CBC WITH DIFFERENTIAL/PLATELET - Abnormal; Notable for the following components:      Result Value   WBC 14.2 (*)    RBC 3.75 (*)    Hemoglobin 10.9 (*)    HCT 33.7 (*)    Neutro Abs 11.6 (*)    All other components within normal limits  COMPREHENSIVE METABOLIC PANEL - Abnormal; Notable for the following components:   Sodium 130 (*)    Chloride 98 (*)    Glucose, Bld 107 (*)    Calcium 8.8 (*)    Total Protein 6.4 (*)    Albumin 3.2 (*)    All other components within normal limits  LIPASE, BLOOD    EKG None  Radiology Ct Angio Chest Pe W And/or Wo Contrast  Result Date: 09/28/2017 CLINICAL DATA:  77 y/o F; knee replacement surgery 09/19/2017. PE suspected, high pretest probability. EXAM: CT ANGIOGRAPHY CHEST WITH CONTRAST TECHNIQUE: Multidetector CT imaging of the chest was performed using the standard protocol during bolus administration of intravenous contrast. Multiplanar CT image reconstructions and MIPs were obtained to evaluate the vascular anatomy. CONTRAST:  70 cc Isovue 370 COMPARISON:  None. FINDINGS: Cardiovascular: Satisfactory opacification of the pulmonary arteries to the segmental level. No evidence of pulmonary embolism. Normal heart size. No pericardial effusion. Minimal calcific atherosclerosis of thoracic aorta. Mediastinum/Nodes: No enlarged mediastinal, hilar, or axillary lymph nodes. Thyroid gland, trachea, and esophagus demonstrate no significant findings. Lungs/Pleura: Lungs are clear. No pleural effusion or pneumothorax. Upper Abdomen: No acute abnormality. Musculoskeletal: No acute fracture. Severe osteoarthrosis of the glenohumeral joints. Mild discogenic degenerative changes of thoracic spine. Review of the MIP images confirms the above findings. IMPRESSION: No acute pulmonary embolus identified. Unremarkable CTA of the chest. Electronically Signed   By: Kristine Garbe M.D.   On: 09/28/2017 20:40    Procedures Procedures  (including critical care time)  Medications Ordered in ED Medications  iopamidol (ISOVUE-370) 76 % injection 100 mL (has no administration in time range)  iopamidol (ISOVUE-370) 76 % injection (100 mLs  Contrast Given 09/28/17 2012)     Initial Impression / Assessment and Plan / ED Course  I have reviewed the triage vital signs and the nursing notes.  Pertinent labs & imaging results that were available during my care of the patient were reviewed by me and considered in my medical decision making (see chart for details).      Patient Vitals for the past 24 hrs:  BP Temp Temp src Pulse Resp SpO2  09/28/17 2100 (!) 169/84 - - 94 17 97 %  09/28/17 1855 (!) 151/77 98.7 F (37.1 C) Oral 79 16 98 %  09/28/17 1754 (!) 162/84 98.5 F (36.9 C) Oral 89 18 98 %    9:14 PM Reevaluation with update and discussion. After initial assessment and treatment, an updated evaluation reveals she is comfortable.  Findings discussed with patient, and daughter, all questions answered.[place] decision making-wall pain, with reassuring evaluation.  Doubt pneumonia, PE or Location from recent episode of diverticulitis with a small sigmoid colon perforation.  She is stooling, does not appear septic, and is tolerating her diet.   Nursing Notes Reviewed/ Care Coordinated Applicable Imaging Reviewed Interpretation of Laboratory Data incorporated into ED treatment  The patient appears reasonably screened and/or stabilized  for discharge and I doubt any other medical condition or other [facility] requiring further screening, evaluation, or treatment in the ED at this time prior to discharge.  Plan: Home Medications-continue current medicines, and use Colace twice a day; Home Treatments-drink plenty of fluids and gradually advance diet; return here if the recommended treatment, does not improve the symptoms; Recommended follow up-PCP and general surgery follow-up as needed.       Final Clinical  Impressions(s) / ED Diagnoses   Final diagnoses:  Nonspecific chest pain    ED Discharge Orders    None       Daleen Bo, MD 09/28/17 2117

## 2017-09-28 NOTE — Discharge Instructions (Signed)
Diverticulitis °Diverticulitis is infection or inflammation of small pouches (diverticula) in the colon that form due to a condition called diverticulosis. Diverticula can trap stool (feces) and bacteria, causing infection and inflammation. °Diverticulitis may cause severe stomach pain and diarrhea. It may lead to tissue damage in the colon that causes bleeding. The diverticula may also burst (rupture) and cause infected stool to enter other areas of the abdomen. °Complications of diverticulitis can include: °· Bleeding. °· Severe infection. °· Severe pain. °· Rupture (perforation) of the colon. °· Blockage (obstruction) of the colon. ° °What are the causes? °This condition is caused by stool becoming trapped in the diverticula, which allows bacteria to grow in the diverticula. This leads to inflammation and infection. °What increases the risk? °You are more likely to develop this condition if: °· You have diverticulosis. The risk for diverticulosis increases if: °? You are overweight or obese. °? You use tobacco products. °? You do not get enough exercise. °· You eat a diet that does not include enough fiber. High-fiber foods include fruits, vegetables, beans, nuts, and whole grains. ° °What are the signs or symptoms? °Symptoms of this condition may include: °· Pain and tenderness in the abdomen. The pain is normally located on the left side of the abdomen, but it may occur in other areas. °· Fever and chills. °· Bloating. °· Cramping. °· Nausea. °· Vomiting. °· Changes in bowel routines. °· Blood in your stool. ° °How is this diagnosed? °This condition is diagnosed based on: °· Your medical history. °· A physical exam. °· Tests to make sure there is nothing else causing your condition. These tests may include: °? Blood tests. °? Urine tests. °? Imaging tests of the abdomen, including X-rays, ultrasounds, MRIs, or CT scans. ° °How is this treated? °Most cases of this condition are mild and can be treated at home.  Treatment may include: °· Taking over-the-counter pain medicines. °· Following a clear liquid diet. °· Taking antibiotic medicines by mouth. °· Rest. ° °More severe cases may need to be treated at a hospital. Treatment may include: °· Not eating or drinking. °· Taking prescription pain medicine. °· Receiving antibiotic medicines through an IV tube. °· Receiving fluids and nutrition through an IV tube. °· Surgery. ° °When your condition is under control, your health care provider may recommend that you have a colonoscopy. This is an exam to look at the entire large intestine. During the exam, a lubricated, bendable tube is inserted into the anus and then passed into the rectum, colon, and other parts of the large intestine. A colonoscopy can show how severe your diverticula are and whether something else may be causing your symptoms. °Follow these instructions at home: °Medicines °· Take over-the-counter and prescription medicines only as told by your health care provider. These include fiber supplements, probiotics, and stool softeners. °· If you were prescribed an antibiotic medicine, take it as told by your health care provider. Do not stop taking the antibiotic even if you start to feel better. °· Do not drive or use heavy machinery while taking prescription pain medicine. °General instructions °· Follow a full liquid diet or another diet as directed by your health care provider. After your symptoms improve, your health care provider may tell you to change your diet. He or she may recommend that you eat a diet that contains at least 25 g (25 grams) of fiber daily. Fiber makes it easier to pass stool. Healthy sources of fiber include: °? Berries. One cup   contains 4-8 grams of fiber. ? Beans or lentils. One half cup contains 5-8 grams of fiber. ? Green vegetables. One cup contains 4 grams of fiber.  Exercise for at least 30 minutes, 3 times each week. You should exercise hard enough to raise your heart rate and  break a sweat.  Keep all follow-up visits as told by your health care provider. This is important. You may need a colonoscopy. Contact a health care provider if:  Your pain does not improve.  You have a hard time drinking or eating food.  Your bowel movements do not return to normal. Get help right away if:  Your pain gets worse.  Your symptoms do not get better with treatment.  Your symptoms suddenly get worse.  You have a fever.  You vomit more than one time.  You have stools that are bloody, black, or tarry. Summary  Diverticulitis is infection or inflammation of small pouches (diverticula) in the colon that form due to a condition called diverticulosis. Diverticula can trap stool (feces) and bacteria, causing infection and inflammation.  You are at higher risk for this condition if you have diverticulosis and you eat a diet that does not include enough fiber.  Most cases of this condition are mild and can be treated at home. More severe cases may need to be treated at a hospital.  When your condition is under control, your health care provider may recommend that you have an exam called a colonoscopy. This exam can show how severe your diverticula are and whether something else may be causing your symptoms. This information is not intended to replace advice given to you by your health care provider. Make sure you discuss any questions you have with your health care provider. Document Released: 03/17/2005 Document Revised: 07/10/2016 Document Reviewed: 07/10/2016 Elsevier Interactive Patient Education  2018 [place] Meal Plan Follow for 3-4 weeks A soft-food meal plan includes foods that are safe and easy to swallow. This meal plan typically is used:  If you are having trouble chewing or swallowing foods.  As a transition meal plan after only having had liquid meals for a long period.  What do I need to know about the soft-food meal plan? A soft-food  meal plan includes tender foods that are soft and easy to chew and swallow. In most cases, bite-sized pieces of food are easier to swallow. A bite-sized piece is about  inch or smaller. Foods in this plan do not need to be ground or pureed. Foods that are very hard, crunchy, or sticky should be avoided. Also, breads, cereals, yogurts, and desserts with nuts, seeds, or fruits should be avoided. What foods can I eat? Grains Rice and wild rice. Moist bread, dressing, pasta, and noodles. Well-moistened dry or cooked cereals, such as farina (cooked wheat cereal), oatmeal, or grits. Biscuits, breads, muffins, pancakes, and waffles that have been well moistened. Vegetables Shredded lettuce. Cooked, tender vegetables, including potatoes without skins. Vegetable juices. Broths or creamed soups made with vegetables that are not stringy or chewy. Strained tomatoes (without seeds). Fruits Canned or well-cooked fruits. Soft (ripe), peeled fresh fruits, such as peaches, nectarines, kiwi, cantaloupe, honeydew melon, and watermelon (without seeds). Soft berries with small seeds, such as strawberries. Fruit juices (without pulp). Meats and Other Protein Sources Moist, tender, lean beef. Mutton. Lamb. Veal. Chicken. [place]. Liver. Ham. Fish without bones. Eggs. Dairy Milk, milk drinks, and cream. Plain cream cheese and cottage cheese. Plain yogurt. Sweets/Desserts Flavored gelatin desserts. Custard.  Plain ice cream, frozen yogurt, sherbet, milk shakes, and malts. Plain cakes and cookies. Plain hard candy. Other Butter, margarine (without trans fat), and cooking oils. Mayonnaise. Cream sauces. Mild spices, salt, and sugar. Syrup, molasses, honey, and jelly. The items listed above may not be a complete list of recommended foods or beverages. Contact your dietitian for more options. What foods are not recommended? Grains Dry bread, toast, crackers that have not been moistened. Coarse or dry cereals, such as bran,  granola, and shredded wheat. Tough or chewy crusty breads, such as [place] bread or baguettes. Vegetables Corn. Raw vegetables except shredded lettuce. Cooked vegetables that are tough or stringy. Tough, crisp, fried potatoes and potato skins. Fruits Fresh fruits with skins or seeds or both, such as apples, pears, or grapes. Stringy, high-pulp fruits, such as papaya, pineapple, coconut, or mango. Fruit leather, fruit roll-ups, and all dried fruits. Meats and Other Protein Sources Sausages and hot dogs. Meats with gristle. Fish with bones. Nuts, seeds, and chunky peanut or other nut butters. Sweets/Desserts Cakes or cookies that are very dry or chewy. The items listed above may not be a complete list of foods and beverages to avoid. Contact your dietitian for more information. This information is not intended to replace advice given to you by your health care provider. Make sure you discuss any questions you have with your health care provider. Document Released: 09/14/2007 Document Revised: 11/13/2015 Document Reviewed: 05/04/2013 Elsevier Interactive Patient Education  2017 [employer].

## 2017-09-28 NOTE — Discharge Summary (Signed)
[facility]/Trauma Discharge Summary   Patient ID: Barbara Hernandez MRN: [id number] DOB/AGE: 75-May-1942 75 y.o.  Admit date: 09/23/2017 Discharge date: 09/28/2017  Admitting Diagnosis: Sigmoid thickening with diverticula with free intraperitoneal air  Discharge Diagnosis Patient Active Problem List   Diagnosis Date Noted  . Perforation of sigmoid colon due to diverticulitis 09/26/2017  . Primary localized osteoarthritis of left knee 09/06/2017  . Primary localized osteoarthrosis of the knee, right 07/28/2016  . Benign essential hypertension 03/18/2015  . 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 Arthritis, degenerative 11/22/2014  . VFD (visual field defect) 11/22/2014  . Hypothyroidism 10/08/2014  . Barrett's esophagus 10/08/2014  . Hypercholesteremia 10/08/2014  . Hypertension 10/08/2014  . Hematuria 10/08/2014  . Onychomycosis 10/08/2014  . Mild left ventricular hypertrophy 10/08/2014  . Pernicious anemia 10/08/2014  . Vitamin D deficiency 10/08/2014  . Vertigo, benign paroxysmal 10/08/2014  . Systolic murmur 50/02/3817  . Sleep apnea 10/08/2014  . Arthritis of shoulder region, right, degenerative 10/08/2014  . Allergic rhinitis 10/08/2014  . 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 MI (mitral incompetence) 08/14/2014  . Esophageal reflux 09/07/2012  . Hemorrhoid     Consultants none  Imaging: No results found.  Procedures none  HPI: 77 y.o. F s/p recent knee replacement with increasingly worse abd pain and bloating over the past couple days.  Last BM was Wed.  No fevers.  Occasional nausea.  She reported these symptoms to her Orthopedic MD and he ordered a CT scan. This showed a small amt of free air and inflamed sigmoid consistent with diverticulitis  Hospital Course:  Patient was admitted and placed on IV antibiotics and bowel rest.  Diet was advanced as tolerated. Abdominal pain improved and diet was advanced as tolerated. On 09/28/17, the pt was ambulating,  voiding well, tolerating diet, abdominal pain improved and felt stable for discharge home. Patient knows to follow up in 6 weeks and knows to call with questions or concerns. Patient was discharged in good condition.   Physical Exam: Gen: Alert, NAD, pleasant, cooperative Card: RRR, no M/G/R heard Pulm: CTA, no W/R/R, rate andeffort normal Abd: Soft, ND, +BS, very mild TTP in lower abdomen, no signs of peritonitis  Skin: no rashes noted, warm and dry   Allergies as of 09/28/2017      Reactions   Carvedilol Other (See Comments)   No energy, fatigued    Hctz [hydrochlorothiazide] Other (See Comments)   Abnormal labs      Medication List    TAKE these medications   acetaminophen 500 MG tablet Commonly known as:  TYLENOL Take 1,000 mg by mouth every 6 (six) hours as needed for mild pain.   amoxicillin-clavulanate 875-125 MG tablet Commonly known as:  AUGMENTIN Take 1 tablet by mouth every 12 (twelve) hours for 10 days.   aspirin 325 MG EC tablet 1 tab a day for the next 30 days to prevent blood clots   CALCIUM 1200 PO Take 1 tablet by mouth daily.   carboxymethylcellulose 0.5 % Soln Commonly known as:  REFRESH PLUS Place 1 drop into both eyes every morning.   docusate sodium 100 MG capsule Commonly known as:  COLACE 1 tab 2 times a day while on narcotics.  STOOL SOFTENER   gabapentin 300 MG capsule Commonly known as:  NEURONTIN 1 tablet at night for nerve pain   GENTEAL TEARS OP Apply 1 application to eye at bedtime.   levothyroxine 125 MCG tablet Commonly known as:  SYNTHROID, LEVOTHROID Take 1 tablet (125 mcg  total) by mouth daily.   lisinopril 40 MG tablet Commonly known as:  PRINIVIL,ZESTRIL Take 1 tablet (40 mg total) by mouth daily. bedtime   MYLANTA 200-200-20 MG/5ML suspension Generic drug:  alum & mag hydroxide-simeth Take 30 mLs by mouth every 6 (six) hours as needed for indigestion or heartburn.   omeprazole 20 MG capsule Commonly known as:   PRILOSEC Take 1 capsule (20 mg total) by mouth daily.   ondansetron 4 MG tablet Commonly known as:  ZOFRAN Take 1 tablet (4 mg total) by mouth every 6 (six) hours as needed for nausea.   oxyCODONE 5 MG immediate release tablet Commonly known as:  Oxy IR/ROXICODONE 1 po q 4 hrs prn pain.  Patient is s/p left total knee replacement on  09/20/17   polyethylene glycol packet Commonly known as:  MIRALAX / GLYCOLAX Not pertinent to current symptomatology therefore not examined. What changed:    how much to take  how to take this  when to take this  reasons to take this  additional instructions   simvastatin 20 MG tablet Commonly known as:  ZOCOR Take 1 tablet (20 mg total) by mouth daily. bedtime What changed:    when to take this  additional instructions   spironolactone 25 MG tablet Commonly known as:  ALDACTONE Take 25 mg by mouth daily.   vitamin B-12 100 MCG tablet Commonly known as:  CYANOCOBALAMIN Take 100 mcg by mouth daily.   Vitamin D3 2000 units Tabs Take 1 tablet by mouth every evening. bedtime        Follow-up Information    Kinsinger, Arta Bruce, MD. Schedule an appointment as soon as possible for a visit in 6 week(s).   Specialty:  General Surgery Why:  for follow up Contact information: [address] [address] [phone]           Signed: [facility] 09/28/2017, 8:49 AM Pager: [phone] Consults: [phone] Mon-Fri 7:00 am-4:30 pm Sat-Sun 7:00 am-11:30 am

## 2017-09-28 NOTE — ED Provider Notes (Signed)
Patient placed in Quick Look pathway, seen and evaluated   Chief Complaint:  HPI:   Knee replacement on April 1st.  Went into hospital with diverticulitis 5 days ago, just discharged today. Pt states she was feeling better this morning, abdominal pain improving. While in the hospital, just prior to dc, developed sharp pain in right lower chest under the ribs. Pain is pleuritic, reports some "hard time catching breath." states these symptoms are new, and nothing she has ever experienced before. Sent here by orthopedics concerning for PE.    ROS: Positive for right upper quadrant/right lower chest pain.  Positive for shortness of breath  Physical Exam:   Gen: No distress  Neuro: Awake and Alert  Skin: Warm    Focused Exam: Abdomen is benign, no right upper quadrant tenderness.  Lungs are clear bilaterally.  Patient with recent surgery, left knee replacement, with complicated recovery course of acute diverticulitis with hospitalization, just got discharged today.  Her pain is getting much better, however she developed sudden onset of sharp pleuritic right lower quadrant chest pain.  Differential includes pulmonary embolism, free air from perforated bowel, muscular injury, spasm, gallbladder disease.  Lab work, CT angios ordered.  Patient is in no acute distress, vital signs are normal other than mild hypertension.    Initiation of care has begun. The patient has been counseled on the process, plan, and necessity for staying for the completion/evaluation, and the remainder of the medical screening examination    Jeannett Senior, PA-C 09/28/17 [place], [place]de [place], DO 09/28/17 2118

## 2017-09-28 NOTE — Discharge Instructions (Signed)
For pain use Tylenol every 4 hours, and a heating pad on the chest where it hurts.  Gradually advance her diet and drink plenty of fluids.  Using Dulcolax and use Colace, twice a day to encourage normal stooling.  Return here, if needed, for problems.

## 2017-09-28 NOTE — ED Notes (Signed)
The pt is here with lt klnee pain that just started today  The pt had a lt knee replacement April 1st  At [facility] long  She went home the next day.  She also just spent a week at [facility] for divertulitis  And was jus discharged from there this am.  While she was getting ready to leave she developed a pain in her lt knee.  Sent here for a work up  Since Wednesday she has felt sick and she has had chills without a temp   She has a catch in her rt lateral ribs  Intermittently also

## 2017-09-28 NOTE — ED Triage Notes (Signed)
Pt to ER sent by ortho PA for rule out blood clot after knee replacement surgery and spending a week in the hospital. Pt denies shortness of breath. Pt a/o x4.

## 2017-09-28 NOTE — ED Notes (Signed)
Pt. Back from ct

## 2017-09-28 NOTE — Progress Notes (Addendum)
PA contacted to get Home Health PT orders for patient;  PA states that they area unable to order PT and that Dr. Noemi Chapel should be called for orders.  Message left with office staff at Dr. Archie Endo office; currently awaiting callback.  Received return call from [employer], [place] who states she will contact the home health agency to resume patient's [facility] PT

## 2017-09-28 NOTE — ED Notes (Signed)
To ct

## 2017-10-13 DIAGNOSIS — E669 Obesity, unspecified: Secondary | ICD-10-CM | POA: Insufficient documentation

## 2017-10-19 ENCOUNTER — Other Ambulatory Visit: Payer: Self-pay | Admitting: Family Medicine

## 2017-10-19 DIAGNOSIS — M5416 Radiculopathy, lumbar region: Secondary | ICD-10-CM

## 2017-10-27 ENCOUNTER — Ambulatory Visit
Admission: RE | Admit: 2017-10-27 | Discharge: 2017-10-27 | Disposition: A | Discharge status: Home / Self Care | Payer: Medicare Other | Source: Ambulatory Visit | Attending: Family Medicine | Admitting: Family Medicine

## 2017-10-27 DIAGNOSIS — M48061 Spinal stenosis, lumbar region without neurogenic claudication: Secondary | ICD-10-CM | POA: Insufficient documentation

## 2017-10-27 DIAGNOSIS — M7989 Other specified soft tissue disorders: Secondary | ICD-10-CM | POA: Diagnosis not present

## 2017-10-27 DIAGNOSIS — M545 Low back pain: Secondary | ICD-10-CM | POA: Diagnosis present

## 2017-10-27 DIAGNOSIS — M5136 Other intervertebral disc degeneration, lumbar region: Secondary | ICD-10-CM | POA: Insufficient documentation

## 2017-10-27 DIAGNOSIS — M5416 Radiculopathy, lumbar region: Secondary | ICD-10-CM | POA: Insufficient documentation

## 2017-10-27 DIAGNOSIS — G8929 Other chronic pain: Secondary | ICD-10-CM | POA: Insufficient documentation

## 2017-11-01 ENCOUNTER — Telehealth: Payer: Self-pay | Admitting: Family Medicine

## 2017-11-01 NOTE — Telephone Encounter (Signed)
Spoke to patient, she is no longer a patient there.

## 2017-11-13 ENCOUNTER — Ambulatory Visit
Admission: EM | Admit: 2017-11-13 | Discharge: 2017-11-13 | Disposition: A | Discharge status: Home / Self Care | Payer: Medicare Other | Attending: Family Medicine | Admitting: Family Medicine

## 2017-11-13 ENCOUNTER — Other Ambulatory Visit: Payer: Self-pay

## 2017-11-13 DIAGNOSIS — R300 Dysuria: Secondary | ICD-10-CM

## 2017-11-13 LAB — WET PREP, GENITAL
Clue Cells Wet Prep HPF POC: NONE SEEN
SPERM: NONE SEEN
TRICH WET PREP: NONE SEEN
YEAST WET PREP: NONE SEEN

## 2017-11-13 LAB — URINALYSIS, COMPLETE (UACMP) WITH MICROSCOPIC
BACTERIA UA: NONE SEEN
Bilirubin Urine: NEGATIVE
Glucose, UA: NEGATIVE mg/dL
Hgb urine dipstick: NEGATIVE
Leukocytes, UA: NEGATIVE
Nitrite: NEGATIVE
SPECIFIC GRAVITY, URINE: 1.015 (ref 1.005–1.030)
pH: 7 (ref 5.0–8.0)

## 2017-11-13 NOTE — Discharge Instructions (Addendum)
Monitor symptoms. Drink plenty of fluids.   Follow up with your primary care physician this week. Return to Urgent care for new or worsening concerns.

## 2017-11-13 NOTE — ED Triage Notes (Signed)
Pt reports pressure in her public area. Some dysuria when starting stream of urine. Past 2 nights she has been having urinary frequency. Recently treated for yeast infection

## 2017-11-13 NOTE — ED Provider Notes (Signed)
[facility] ____________________________________________  Time seen: Approximately 10:46 AM  I have reviewed the triage vital signs and the nursing notes.   HISTORY  Chief Complaint Urinary Frequency   HPI Barbara Hernandez is a 77 y.o. female presented for evaluation of dysuria present for the last 2 days.  Patient states she is having some suprapubic pressure sensation with some mild discomfort with active urination.  Denies any other abdominal discomfort.  Denies atypical back pain.  No associated fevers, vomiting or change in bowel habits.  Patient states that she wanted to make sure she does not have any urinary tract infection.  Reports that she did take oral Diflucan for yeast vaginitis approximately 1 month ago after having taken antibiotics for diverticulitis.  Denies any vaginal itching, vaginal discharge or symptoms consistent with her past yeast vaginitis.  Denies any vaginal bulge or prolapse appearance.  Reports otherwise feels very well. Denies chest pain, shortness of breath, extremity swelling or rash.  Reports continues to eat and drink well.  Denies recent sickness. Denies recent antibiotic use.  Denies diabetes.  Hortencia Pilar, MD: PCP  Past Medical History:  Diagnosis Date  . Anemia   . Arthritis   . Barrett's esophagus 2012   2010 upper endoscopy showed only reflux. 2012 biopsies suggested Barrett's epithelial changes.  . Bladder tumor   . BPPV (benign paroxysmal positional vertigo) 2004  . Family history of adverse reaction to anesthesia    sister had memory problems after delivery of child  . GERD (gastroesophageal reflux disease)   . H/O measles   . Heart murmur   . Hematuria   . Hematuria   . Hemorrhoid   . Hemorrhoids   . History of chicken pox   . Hyperlipidemia   . Hypertension   . Hypothyroidism   . Joint pain   . Mild left ventricular hypertrophy   . Onychomycosis   . Primary localized osteoarthritis of left knee 09/06/2017  .  Reflux   . Reflux esophagitis   . 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 Sleep apnea 2011   Uses C-Pap machine  . Thyroid disease     Patient Active Problem List   Diagnosis Date Noted  . Perforation of sigmoid colon due to diverticulitis 09/26/2017  . Primary localized osteoarthritis of left knee 09/06/2017  . Primary localized osteoarthrosis of the knee, right 07/28/2016  . Benign essential hypertension 03/18/2015  . 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 Arthritis, degenerative 11/22/2014  . VFD (visual field defect) 11/22/2014  . Hypothyroidism 10/08/2014  . Barrett's esophagus 10/08/2014  . Hypercholesteremia 10/08/2014  . Hypertension 10/08/2014  . Hematuria 10/08/2014  . Onychomycosis 10/08/2014  . Mild left ventricular hypertrophy 10/08/2014  . Pernicious anemia 10/08/2014  . Vitamin D deficiency 10/08/2014  . Vertigo, benign paroxysmal 10/08/2014  . Systolic murmur 50/53/9767  . Sleep apnea 10/08/2014  . Arthritis of shoulder region, right, degenerative 10/08/2014  . Allergic rhinitis 10/08/2014  . 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 MI (mitral incompetence) 08/14/2014  . Esophageal reflux 09/07/2012  . Hemorrhoid     Past Surgical History:  Procedure Laterality Date  . BLEPHAROPLASTY Bilateral 2015  . BREAST EXCISIONAL BIOPSY Left 1988   benign  . COLONOSCOPY W/ BIOPSIES N/A 01/16/2013   No source for GI blood loss, mild lymphatic prominence in the rectum.  . ESOPHAGOGASTRODUODENOSCOPY (EGD) WITH PROPOFOL N/A 03/15/2016   Procedure: ESOPHAGOGASTRODUODENOSCOPY (EGD) WITH PROPOFOL;  Surgeon: Robert Bellow, MD;  Location: [facility];  Service: Endoscopy;  Laterality: N/A;  . EYE SURGERY Bilateral 2006   cataract  . Left wrist fracture    . POPLITEAL SYNOVIAL CYST EXCISION Right   . TOTAL KNEE ARTHROPLASTY Right 08/09/2016   Procedure: TOTAL KNEE ARTHROPLASTY;  Surgeon: Elsie Saas, MD;  Location: [facility];  Service: Orthopedics;  Laterality: Right;  .  TOTAL KNEE ARTHROPLASTY Left 09/19/2017   Procedure: TOTAL KNEE ARTHROPLASTY;  Surgeon: Elsie Saas, MD;  Location: [facility];  Service: Orthopedics;  Laterality: Left;  . TRANSURETHRAL RESECTION OF BLADDER TUMOR N/A 11/20/2014   Procedure: TRANSURETHRAL RESECTION OF BLADDER TUMOR (TURBT);  Surgeon: Hollice Espy, MD;  Location: [facility];  Service: Urology;  Laterality: N/A;  . UPPER GI ENDOSCOPY  2012, 2014     No current facility-administered medications for this encounter.   Current Outpatient Medications:  .  aspirin EC 81 MG tablet, Take 81 mg by mouth daily., Disp: , Rfl:  .  carvedilol (COREG) 6.25 MG tablet, Take 6.25 mg by mouth 2 (two) times daily with a meal., Disp: , Rfl:  .  celecoxib (CELEBREX) 200 MG capsule, Take 200 mg by mouth 2 (two) times daily., Disp: , Rfl:  .  acetaminophen (TYLENOL) 500 MG tablet, Take 500 mg by mouth every 6 (six) hours as needed for mild pain. , Disp: , Rfl:  .  Artificial Tear Solution (GENTEAL TEARS OP), Apply 1 application to eye at bedtime., Disp: , Rfl:  .  Calcium Carbonate-Vit D-Min (CALCIUM 1200 PO), Take 1 tablet by mouth daily. , Disp: , Rfl:  .  carboxymethylcellulose (REFRESH PLUS) 0.5 % SOLN, Place 1 drop into both eyes every morning. , Disp: , Rfl:  .  Cholecalciferol (VITAMIN D3) 2000 UNITS TABS, Take 1 tablet by mouth every evening. bedtime, Disp: , Rfl:  .  docusate sodium (COLACE) 100 MG capsule, 1 tab 2 times a day while on narcotics.  STOOL SOFTENER, Disp: 60 capsule, Rfl: 0 .  levothyroxine (SYNTHROID, LEVOTHROID) 125 MCG tablet, Take 1 tablet (125 mcg total) by mouth daily., Disp: 30 tablet, Rfl: 3 .  lisinopril (PRINIVIL,ZESTRIL) 40 MG tablet, Take 1 tablet (40 mg total) by mouth daily. bedtime, Disp: 90 tablet, Rfl: 3 .  omeprazole (PRILOSEC) 20 MG capsule, Take 1 capsule (20 mg total) by mouth daily., Disp: 90 capsule, Rfl: 3 .  oxyCODONE (OXY IR/ROXICODONE) 5 MG immediate release tablet, 1 po q 4 hrs prn pain.  Patient is s/p  left total knee replacement on  09/20/17, Disp: 42 tablet, Rfl: 0 .  simvastatin (ZOCOR) 20 MG tablet, Take 1 tablet (20 mg total) by mouth daily. bedtime (Patient taking differently: Take 20 mg by mouth daily at 6 PM. bedtime), Disp: 90 tablet, Rfl: 3 .  vitamin B-12 (CYANOCOBALAMIN) 100 MCG tablet, Take 100 mcg by mouth daily., Disp: , Rfl:   Allergies Carvedilol and Hctz [hydrochlorothiazide]  Family History  Problem Relation Age of Onset  . Heart disease Mother        CHF, CAD  . Hypertension Mother   . Mental illness Mother        Dementia  . Cancer Mother        breast, Brain Cancer  . Breast cancer Mother 16  . Cancer Father        lung  . Heart disease Father        MI  . Heart disease Sister   . Cancer Sister        Breast  . Barrett's esophagus Sister   .  Hyperlipidemia Sister   . Breast cancer Sister 60  . Asthma Brother   . Heart disease Brother   . Diabetes Brother   . Anxiety disorder Brother   . Neuropathy Brother   . Neuropathy Sister   . Anxiety disorder Sister   . Mental illness Sister        Dementia  . Fibromyalgia Sister     Social History Social History   Tobacco Use  . Smoking status: Former Smoker    Packs/day: 0.50    Years: 20.00    Pack years: 10.00    Types: Cigarettes    Last attempt to quit: 06/20/1977    Years since quitting: 40.4  . Smokeless tobacco: Never Used  Substance Use Topics  . Alcohol use: Yes    Alcohol/week: 0.6 - 1.8 oz    Types: 1 - 2 Standard drinks or equivalent per week  . Drug use: No    Review of Systems Constitutional: No fever/chills Cardiovascular: Denies chest pain. Respiratory: Denies shortness of breath. Gastrointestinal: No nausea, no vomiting.  No diarrhea.  No constipation. Genitourinary: As above.  Musculoskeletal: Negative for back pain. Skin: Negative for rash.  ____________________________________________   PHYSICAL EXAM:  VITAL SIGNS: ED Triage Vitals  Enc Vitals Group     BP  11/13/17 0931 (!) 137/55     Pulse Rate 11/13/17 0931 65     Resp 11/13/17 0931 16     Temp 11/13/17 0931 98.2 F (36.8 C)     Temp Source 11/13/17 0931 Oral     SpO2 11/13/17 0931 99 %     Weight 11/13/17 0934 215 lb (97.5 kg)     Height 11/13/17 0934 5\' 2"  (1.575 m)     Head Circumference --      Peak Flow --      Pain Score 11/13/17 0934 0     Pain Loc --      Pain Edu? --      Excl. in [place]? --     Constitutional: Alert and oriented. Well appearing and in no acute distress. ENT      Head: Normocephalic and atraumatic.      Mouth/Throat: Mucous membranes are moist.Oropharynx non-erythematous. Cardiovascular: Normal rate, regular rhythm. Grossly normal heart sounds.  Good peripheral circulation. Respiratory: Normal respiratory effort without tachypnea nor retractions. Breath sounds are clear and equal bilaterally. No wheezes, rales, rhonchi. Gastrointestinal: Soft and nontender.Normal Bowel sounds. No CVA tenderness. Musculoskeletal:No midline cervical, thoracic or lumbar tenderness to palpation.  Neurologic:  Normal speech and languageSpeech is normal. No gait instability.  Skin:  Skin is warm, dry. Psychiatric: Mood and affect are normal. Speech and behavior are normal. Patient exhibits appropriate insight and judgment   ___________________________________________   LABS (all labs ordered are listed, but only abnormal results are displayed)  Labs Reviewed  WET PREP, GENITAL - Abnormal; Notable for the following components:      Result Value   WBC, Wet Prep HPF POC FEW (*)    All other components within normal limits  URINALYSIS, COMPLETE (UACMP) WITH MICROSCOPIC - Abnormal; Notable for the following components:   Ketones, ur TRACE (*)    Protein, ur TRACE (*)    All other components within normal limits  URINE CULTURE     PROCEDURES Procedures    INITIAL IMPRESSION / ASSESSMENT AND PLAN / ED COURSE  Pertinent labs & imaging results that were available during my  care of the patient were reviewed by me and considered  in my medical decision making (see chart for details).  Well-appearing patient.  No acute distress.  Presenting for evaluation of dysuria.  Urinalysis reviewed, will culture urine.  No clear UTI.  Discussed in detail with patient patient also states not current yeast vaginitis appearance of symptoms.  Self wet prep was completed by patient and unremarkable.  Patient states that she will continue to monitor symptoms and follow-up with her primary this week.  Discussed monitoring closely.  Encourage rest, fluids, supportive care.  Discussed follow up with Primary care physician this week. Discussed follow up and return parameters including no resolution or any worsening concerns. Patient verbalized understanding and agreed to plan.   ____________________________________________   FINAL CLINICAL IMPRESSION(S) / ED DIAGNOSES  Final diagnoses:  Dysuria     ED Discharge Orders    None       Note: This dictation was prepared with Dragon dictation along with smaller phrase technology. Any transcriptional errors that result from this process are unintentional.         Marylene Land, NP 11/13/17 1135

## 2017-11-15 LAB — URINE CULTURE: Culture: <10000 — AB

## 2017-11-21 ENCOUNTER — Ambulatory Visit: Payer: Medicare Other

## 2017-11-25 ENCOUNTER — Other Ambulatory Visit: Payer: Self-pay

## 2017-11-25 ENCOUNTER — Ambulatory Visit
Admission: EM | Admit: 2017-11-25 | Discharge: 2017-11-25 | Disposition: A | Discharge status: Home / Self Care | Payer: Medicare Other | Attending: Family Medicine | Admitting: Family Medicine

## 2017-11-25 DIAGNOSIS — B9789 Other viral agents as the cause of diseases classified elsewhere: Secondary | ICD-10-CM

## 2017-11-25 DIAGNOSIS — J069 Acute upper respiratory infection, unspecified: Secondary | ICD-10-CM | POA: Diagnosis not present

## 2017-11-25 HISTORY — DX: Hypo-osmolality and hyponatremia: E87.1

## 2017-11-25 MED ORDER — BENZONATATE 100 MG PO CAPS: 100.0000 mg | ORAL_CAPSULE | Freq: Three times a day (TID) | ORAL | 0 refills | Status: DC | PRN

## 2017-11-25 NOTE — Discharge Instructions (Signed)
Cough medication as needed.  Rest.  Safe travels.  Dr. Lacinda Axon

## 2017-11-25 NOTE — ED Provider Notes (Signed)
[facility]   CSN: [id number] Arrival date & time: 11/25/17  1254  History   Chief Complaint Chief Complaint  Patient presents with  . Cough   HPI  77 year old female presents with cough and sore throat.  Patient reports that she been sick over the past week.  She states that she got better earlier this week but her sore throat has returned and the cough is persistent.  She said no fever.  Cough is nonproductive.  She reports some associated congestion.  No known exacerbating/relieving factors.  She is primarily concerned that she will be traveling this weekend to attend a funeral.  She wants to make sure that she is safe to travel and to be around other people.  No other associated symptoms.  No other complaints.   Past Medical History:  Diagnosis Date  . Anemia   . Arthritis   . Barrett's esophagus 2012   2010 upper endoscopy showed only reflux. 2012 biopsies suggested Barrett's epithelial changes.  . Bladder tumor   . BPPV (benign paroxysmal positional vertigo) 2004  . Family history of adverse reaction to anesthesia    sister had memory problems after delivery of child  . GERD (gastroesophageal reflux disease)   . H/O measles   . Heart murmur   . Hematuria   . Hematuria   . Hemorrhoid   . Hemorrhoids   . History of chicken pox   . Hyperlipidemia   . Hypertension   . Hypothyroidism   . Joint pain   . Low sodium levels   . Mild left ventricular hypertrophy   . Onychomycosis   . Primary localized osteoarthritis of left knee 09/06/2017  . Reflux   . Reflux esophagitis   . 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 Sleep apnea 2011   Uses C-Pap machine  . Thyroid disease     Patient Active Problem List   Diagnosis Date Noted  . Perforation of sigmoid colon due to diverticulitis 09/26/2017  . Primary localized osteoarthritis of left knee 09/06/2017  . Primary localized osteoarthrosis of the knee, right 07/28/2016  . Benign essential hypertension  03/18/2015  . 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 Arthritis, degenerative 11/22/2014  . VFD (visual field defect) 11/22/2014  . Hypothyroidism 10/08/2014  . Barrett's esophagus 10/08/2014  . Hypercholesteremia 10/08/2014  . Hypertension 10/08/2014  . Hematuria 10/08/2014  . Onychomycosis 10/08/2014  . Mild left ventricular hypertrophy 10/08/2014  . Pernicious anemia 10/08/2014  . Vitamin D deficiency 10/08/2014  . Vertigo, benign paroxysmal 10/08/2014  . Systolic murmur 13/24/4010  . Sleep apnea 10/08/2014  . Arthritis of shoulder region, right, degenerative 10/08/2014  . Allergic rhinitis 10/08/2014  . 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 MI (mitral incompetence) 08/14/2014  . Esophageal reflux 09/07/2012  . Hemorrhoid     Past Surgical History:  Procedure Laterality Date  . BLEPHAROPLASTY Bilateral 2015  . BREAST EXCISIONAL BIOPSY Left 1988   benign  . COLONOSCOPY W/ BIOPSIES N/A 01/16/2013   No source for GI blood loss, mild lymphatic prominence in the rectum.  . ESOPHAGOGASTRODUODENOSCOPY (EGD) WITH PROPOFOL N/A 03/15/2016   Procedure: ESOPHAGOGASTRODUODENOSCOPY (EGD) WITH PROPOFOL;  Surgeon: Robert Bellow, MD;  Location: [facility];  Service: Endoscopy;  Laterality: N/A;  . EYE SURGERY Bilateral 2006   cataract  . Left wrist fracture    . POPLITEAL SYNOVIAL CYST EXCISION Right   . TOTAL KNEE ARTHROPLASTY Right 08/09/2016   Procedure: TOTAL KNEE ARTHROPLASTY;  Surgeon: Elsie Saas, MD;  Location: [place];  Service: Orthopedics;  Laterality: Right;  . TOTAL KNEE ARTHROPLASTY Left 09/19/2017   Procedure: TOTAL KNEE ARTHROPLASTY;  Surgeon: Elsie Saas, MD;  Location: [place];  Service: Orthopedics;  Laterality: Left;  . TRANSURETHRAL RESECTION OF BLADDER TUMOR N/A 11/20/2014   Procedure: TRANSURETHRAL RESECTION OF BLADDER TUMOR (TURBT);  Surgeon: Hollice Espy, MD;  Location: [facility];  Service: Urology;  Laterality: N/A;  . UPPER GI ENDOSCOPY  2012, 2014    OB History     Gravida  2   Para  2   Term      Preterm      AB      Living  2     SAB      TAB      Ectopic      Multiple      Live Births           Obstetric Comments  Age at first menstrual period 44 Age at first pregnancy 76         Home Medications    Prior to Admission medications   Medication Sig Start Date End Date Taking? Authorizing Provider  acetaminophen (TYLENOL) 500 MG tablet Take 500 mg by mouth every 6 (six) hours as needed for mild pain.     [provider]  Artificial Tear Solution (GENTEAL TEARS OP) Apply 1 application to eye at bedtime.    [provider]  aspirin EC 81 MG tablet Take 81 mg by mouth daily.    [provider]  benzonatate (TESSALON) 100 MG capsule Take 1 capsule (100 mg total) by mouth 3 (three) times daily as needed. 11/25/17   Coral Spikes, DO  Calcium Carbonate-Vit D-Min (CALCIUM 1200 PO) Take 1 tablet by mouth daily.     [provider]  carboxymethylcellulose (REFRESH PLUS) 0.5 % SOLN Place 1 drop into both eyes every morning.     [provider]  carvedilol (COREG) 6.25 MG tablet Take 6.25 mg by mouth 2 (two) times daily with a meal.    [provider]  celecoxib (CELEBREX) 200 MG capsule Take 200 mg by mouth 2 (two) times daily.    [provider]  Cholecalciferol (VITAMIN D3) 2000 UNITS TABS Take 1 tablet by mouth every evening. bedtime    [provider]  docusate sodium (COLACE) 100 MG capsule 1 tab 2 times a day while on narcotics.  STOOL SOFTENER 09/20/17   Shepperson, Kirstin, PA-C  levothyroxine (SYNTHROID, LEVOTHROID) 125 MCG tablet Take 1 tablet (125 mcg total) by mouth daily. 09/11/15   Margarita Rana, MD  lisinopril (PRINIVIL,ZESTRIL) 40 MG tablet Take 1 tablet (40 mg total) by mouth daily. bedtime 05/13/15   Margarita Rana, MD  omeprazole (PRILOSEC) 20 MG capsule Take 1 capsule (20 mg total) by mouth daily. 04/15/15   Margarita Rana, MD  simvastatin (ZOCOR)  20 MG tablet Take 1 tablet (20 mg total) by mouth daily. bedtime Patient taking differently: Take 20 mg by mouth daily at 6 PM. bedtime 05/13/15   Margarita Rana, MD  vitamin B-12 (CYANOCOBALAMIN) 100 MCG tablet Take 100 mcg by mouth daily.    [provider]    Family History Family History  Problem Relation Age of Onset  . Heart disease Mother        CHF, CAD  . Hypertension Mother   . Mental illness Mother        Dementia  . Cancer Mother        breast, Brain Cancer  .  Breast cancer Mother 29  . Cancer Father        lung  . Heart disease Father        MI  . Heart disease Sister   . Cancer Sister        Breast  . Barrett's esophagus Sister   . Hyperlipidemia Sister   . Breast cancer Sister 43  . Asthma Brother   . Heart disease Brother   . Diabetes Brother   . Anxiety disorder Brother   . Neuropathy Brother   . Neuropathy Sister   . Anxiety disorder Sister   . Mental illness Sister        Dementia  . Fibromyalgia Sister     Social History Social History   Tobacco Use  . Smoking status: Former Smoker    Packs/day: 0.50    Years: 20.00    Pack years: 10.00    Types: Cigarettes    Last attempt to quit: 06/20/1977    Years since quitting: 40.4  . Smokeless tobacco: Never Used  Substance Use Topics  . Alcohol use: Yes    Alcohol/week: 0.6 - 1.8 oz    Types: 1 - 2 Standard drinks or equivalent per week  . Drug use: No     Allergies   Carvedilol and Hctz [hydrochlorothiazide]   Review of Systems Review of Systems  Constitutional: Negative for fever.  HENT: Positive for congestion and sore throat.   Respiratory: Positive for cough.    Physical Exam Triage Vital Signs ED Triage Vitals  Enc Vitals Group     BP 11/25/17 1318 (!) 181/76     Pulse Rate 11/25/17 1318 64     Resp 11/25/17 1318 18     Temp 11/25/17 1318 98.2 F (36.8 C)     Temp Source 11/25/17 1318 Oral     SpO2 11/25/17 1318 99 %     Weight 11/25/17 1319 215 lb (97.5 kg)      Height 11/25/17 1319 5\' 2"  (1.575 m)     Head Circumference --      Peak Flow --      Pain Score 11/25/17 1319 3     Pain Loc --      Pain Edu? --      Excl. in [place]? --    Updated Vital Signs BP (!) 181/76 (BP Location: Right Arm)   Pulse 64   Temp 98.2 F (36.8 C) (Oral)   Resp 18   Ht 5\' 2"  (1.575 m)   Wt 215 lb (97.5 kg)   SpO2 99%   BMI 39.32 kg/m   Physical Exam  Constitutional: She is oriented to person, place, and time. She appears well-developed. No distress.  HENT:  Head: Normocephalic and atraumatic.  Oropharynx with mild erythema.  Normal TMs bilaterally.  Cardiovascular: Normal rate and regular rhythm.  Pulmonary/Chest: Effort normal and breath sounds normal. She has no wheezes. She has no rales.  Neurological: She is alert and oriented to person, place, and time.  Psychiatric: She has a normal mood and affect. Her behavior is normal.  Nursing note and vitals reviewed.  UC Treatments / Results  Labs (all labs ordered are listed, but only abnormal results are displayed) Labs Reviewed - No data to display  EKG None  Radiology No results found.  Procedures Procedures (including critical care time)  Medications Ordered in UC Medications - No data to display  Initial Impression / Assessment and Plan / UC Course  I have reviewed  the triage vital signs and the nursing notes.  Pertinent labs & imaging results that were available during my care of the patient were reviewed by me and considered in my medical decision making (see chart for details).    77 year old female presents with viral URI with cough.  Treating with Tessalon Perles.  Supportive care.  Final Clinical Impressions(s) / UC Diagnoses   Final diagnoses:  Viral URI with cough     Discharge Instructions     Cough medication as needed.  Rest.  Safe travels.  Dr. Lacinda Axon    ED Prescriptions    Medication Sig Dispense Auth. Provider   benzonatate (TESSALON) 100 MG capsule Take 1  capsule (100 mg total) by mouth 3 (three) times daily as needed. 30 capsule Coral Spikes, DO     Controlled Substance Prescriptions [place] Controlled Substance Registry consulted? Not Applicable   Coral Spikes, DO 11/25/17 1421

## 2017-11-25 NOTE — ED Triage Notes (Signed)
Pt with one week of cough and nasal congestion. Sore throat pain. Pt is getting ready to travel and just wants to be checked before traveling.

## 2017-12-01 ENCOUNTER — Telehealth: Payer: Self-pay | Admitting: Urology

## 2017-12-01 ENCOUNTER — Ambulatory Visit: Payer: Medicare Other

## 2017-12-01 VITALS — BP 147/84 | HR 64 | Resp 16 | Ht 62.0 in | Wt 218.2 lb

## 2017-12-01 DIAGNOSIS — Z8551 Personal history of malignant neoplasm of bladder: Secondary | ICD-10-CM

## 2017-12-01 LAB — URINALYSIS, COMPLETE
Bilirubin, UA: NEGATIVE
GLUCOSE, UA: NEGATIVE
Ketones, UA: NEGATIVE
Nitrite, UA: NEGATIVE
PROTEIN UA: NEGATIVE
Specific Gravity, UA: 1.01 (ref 1.005–1.030)
Urobilinogen, Ur: 0.2 mg/dL (ref 0.2–1.0)
pH, UA: 7 (ref 5.0–7.5)

## 2017-12-01 LAB — MICROSCOPIC EXAMINATION

## 2017-12-01 MED ORDER — SULFAMETHOXAZOLE-TRIMETHOPRIM 800-160 MG PO TABS: 1.0000 | ORAL_TABLET | Freq: Two times a day (BID) | ORAL | 0 refills | Status: AC

## 2017-12-01 NOTE — Telephone Encounter (Signed)
Patient picked up Bactrim from pharmacy.  Package insert says to notify the doctor if she is taking Lisinopril and if she has thyroid disease.  She can [phone].

## 2017-12-01 NOTE — Telephone Encounter (Signed)
Pt has Thyroid disease and is on Lisinopril, okay for her to take?

## 2017-12-01 NOTE — Progress Notes (Signed)
Barbara Hernandez is present today for nurse visit. She contacted the after hours nurse line on 11/30/17, stating she passed two blood clots. Pt concerned due to sx's presenting similarly to past tumor. She expresses sx's including: urinary frequency, difficulty postponing urination, dysuria, leakage of urine, hematuria, lower abdominal pain, chills, and urinary urgency. Pt denies abx therapy or urological surgery in the past 30 days. Urine sample was obtained for analysis and culture. Upon viewing pt urine analysis there is suspicion for infection. Culture sent out for evaluation. Per Dr. Erlene Quan pt was started on Bactrim, bid, for 7 days. Pt also requested an appointment w/ Dr. Erlene Quan for Cystoscopy, she was scheduled for January 17, 2018.   Blood pressure (!) 147/84, pulse 64, resp. rate 16, height 5\' 2"  (1.575 m), weight 218 lb 3.2 oz (99 kg), SpO2 96 %.

## 2017-12-02 NOTE — Telephone Encounter (Signed)
This should not be a big issue for the short course.  If she prefer to wait until her culture results which will likely be Monday, that is fine to.  Hollice Espy, MD

## 2017-12-05 LAB — CULTURE, URINE COMPREHENSIVE

## 2017-12-06 ENCOUNTER — Encounter: Payer: Self-pay | Admitting: Urology

## 2017-12-06 NOTE — Telephone Encounter (Signed)
See results note.   Hollice Espy, MD

## 2017-12-06 NOTE — Telephone Encounter (Signed)
Her culture is back from yesterday. Is bactrim the correct ABX?

## 2017-12-07 ENCOUNTER — Telehealth: Payer: Self-pay

## 2017-12-07 MED ORDER — CEPHALEXIN 500 MG PO CAPS: 500.0000 mg | ORAL_CAPSULE | Freq: Three times a day (TID) | ORAL | 0 refills | Status: AC

## 2017-12-07 NOTE — Telephone Encounter (Signed)
Pt informed

## 2017-12-07 NOTE — Telephone Encounter (Signed)
-----   Message from Hollice Espy, MD sent at 12/06/2017  7:38 PM EDT ----- Please change abx to keflex 500 mg tid x 7 days.  Hollice Espy, MD

## 2017-12-09 ENCOUNTER — Encounter: Payer: Self-pay | Admitting: Urology

## 2017-12-16 ENCOUNTER — Telehealth: Payer: Self-pay | Admitting: Urology

## 2017-12-16 NOTE — Telephone Encounter (Signed)
Pt states she is going to see her primary care provider to address this concern

## 2017-12-16 NOTE — Telephone Encounter (Signed)
Pt states she just finished 2 antibiotics for UTI, pt states she now has soreness around her vulva area but doesn't think it is at all yeast. Pt is concerned and really wants to speak to an assistant before the weekend. Please advise pt at [phone]

## 2017-12-17 ENCOUNTER — Ambulatory Visit
Admission: EM | Admit: 2017-12-17 | Discharge: 2017-12-17 | Disposition: A | Discharge status: Home / Self Care | Payer: Medicare Other | Attending: Family Medicine | Admitting: Family Medicine

## 2017-12-17 DIAGNOSIS — N760 Acute vaginitis: Secondary | ICD-10-CM

## 2017-12-17 LAB — WET PREP, GENITAL
Clue Cells Wet Prep HPF POC: NONE SEEN
Sperm: NONE SEEN
Trich, Wet Prep: NONE SEEN
Yeast Wet Prep HPF POC: NONE SEEN

## 2017-12-17 MED ORDER — MICONAZOLE NITRATE 2 % VA CREA
TOPICAL_CREAM | VAGINAL | 0 refills | Status: DC
Start: 2017-12-17 — End: 2017-12-23

## 2017-12-17 NOTE — ED Provider Notes (Signed)
[facility]    CSN: [id number] Arrival date & time: 12/17/17  7253     History   Chief Complaint Chief Complaint  Patient presents with  . Vaginitis    HPI Barbara Hernandez is a 77 y.o. female.   Barbara Hernandez presents with complaints of vaginal irritation and "soreness" which started two days ago. States feels somewhat better today. Looked in a mirror and felt she looked red to vulva. States she had just completed two courses of antibiotics for UTI. Has had yeast infection s/p antibiotics in the past and this feels somewhat similar but not as severe. No vaginal discharge. No itching. No new urinary symptoms, following with gi for potential fissure. No abdominal pain. Hx htn, BPPV, anemia, barretts esophagus, hematuria, hypothyroidism, hyponatremia, lvh   ROS per HPI.      Past Medical History:  Diagnosis Date  . Anemia   . Arthritis   . Barrett's esophagus 2012   2010 upper endoscopy showed only reflux. 2012 biopsies suggested Barrett's epithelial changes.  . Bladder tumor   . BPPV (benign paroxysmal positional vertigo) 2004  . Family history of adverse reaction to anesthesia    sister had memory problems after delivery of child  . GERD (gastroesophageal reflux disease)   . H/O measles   . Heart murmur   . Hematuria   . Hematuria   . Hemorrhoid   . Hemorrhoids   . History of chicken pox   . Hyperlipidemia   . Hypertension   . Hypothyroidism   . Joint pain   . Low sodium levels   . Mild left ventricular hypertrophy   . Onychomycosis   . Primary localized osteoarthritis of left knee 09/06/2017  . Reflux   . Reflux esophagitis   . 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 Sleep apnea 2011   Uses C-Pap machine  . Thyroid disease     Patient Active Problem List   Diagnosis Date Noted  . Perforation of sigmoid colon due to diverticulitis 09/26/2017  . Primary localized osteoarthritis of left knee 09/06/2017  . Primary localized osteoarthrosis of the  knee, right 07/28/2016  . Benign essential hypertension 03/18/2015  . 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 Arthritis, degenerative 11/22/2014  . VFD (visual field defect) 11/22/2014  . Hypothyroidism 10/08/2014  . Barrett's esophagus 10/08/2014  . Hypercholesteremia 10/08/2014  . Hypertension 10/08/2014  . Hematuria 10/08/2014  . Onychomycosis 10/08/2014  . Mild left ventricular hypertrophy 10/08/2014  . Pernicious anemia 10/08/2014  . Vitamin D deficiency 10/08/2014  . Vertigo, benign paroxysmal 10/08/2014  . Systolic murmur 66/44/0347  . Sleep apnea 10/08/2014  . Arthritis of shoulder region, right, degenerative 10/08/2014  . Allergic rhinitis 10/08/2014  . 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 MI (mitral incompetence) 08/14/2014  . Esophageal reflux 09/07/2012  . Hemorrhoid     Past Surgical History:  Procedure Laterality Date  . BLEPHAROPLASTY Bilateral 2015  . BREAST EXCISIONAL BIOPSY Left 1988   benign  . COLONOSCOPY W/ BIOPSIES N/A 01/16/2013   No source for GI blood loss, mild lymphatic prominence in the rectum.  . ESOPHAGOGASTRODUODENOSCOPY (EGD) WITH PROPOFOL N/A 03/15/2016   Procedure: ESOPHAGOGASTRODUODENOSCOPY (EGD) WITH PROPOFOL;  Surgeon: Robert Bellow, MD;  Location: [facility];  Service: Endoscopy;  Laterality: N/A;  . EYE SURGERY Bilateral 2006   cataract  . Left wrist fracture    . POPLITEAL SYNOVIAL CYST EXCISION Right   . TOTAL KNEE ARTHROPLASTY Right 08/09/2016   Procedure: TOTAL KNEE ARTHROPLASTY;  Surgeon: Noemi Chapel,  Herbie Baltimore, MD;  Location: [place];  Service: Orthopedics;  Laterality: Right;  . TOTAL KNEE ARTHROPLASTY Left 09/19/2017   Procedure: TOTAL KNEE ARTHROPLASTY;  Surgeon: Elsie Saas, MD;  Location: [place];  Service: Orthopedics;  Laterality: Left;  . TRANSURETHRAL RESECTION OF BLADDER TUMOR N/A 11/20/2014   Procedure: TRANSURETHRAL RESECTION OF BLADDER TUMOR (TURBT);  Surgeon: Hollice Espy, MD;  Location: [facility];  Service: Urology;  Laterality:  N/A;  . UPPER GI ENDOSCOPY  2012, 2014    OB History    Gravida  2   Para  2   Term      Preterm      AB      Living  2     SAB      TAB      Ectopic      Multiple      Live Births           Obstetric Comments  Age at first menstrual period 64 Age at first pregnancy 86         Home Medications    Prior to Admission medications   Medication Sig Start Date End Date Taking? Authorizing Provider  acetaminophen (TYLENOL) 500 MG tablet Take 500 mg by mouth every 6 (six) hours as needed for mild pain.    Yes [provider]  Artificial Tear Solution (GENTEAL TEARS OP) Apply 1 application to eye at bedtime.   Yes [provider]  aspirin EC 81 MG tablet Take 81 mg by mouth daily.   Yes [provider]  benzonatate (TESSALON) 100 MG capsule Take 1 capsule (100 mg total) by mouth 3 (three) times daily as needed. 11/25/17  Yes Cook, Jayce G, DO  Calcium Carbonate-Vit D-Min (CALCIUM 1200 PO) Take 1 tablet by mouth daily.    Yes [provider]  carboxymethylcellulose (REFRESH PLUS) 0.5 % SOLN Place 1 drop into both eyes every morning.    Yes [provider]  carvedilol (COREG) 6.25 MG tablet Take 6.25 mg by mouth 2 (two) times daily with a meal.   Yes [provider]  celecoxib (CELEBREX) 200 MG capsule Take 200 mg by mouth 2 (two) times daily.   Yes [provider]  Cholecalciferol (VITAMIN D3) 2000 UNITS TABS Take 1 tablet by mouth every evening. bedtime   Yes [provider]  docusate sodium (COLACE) 100 MG capsule 1 tab 2 times a day while on narcotics.  STOOL SOFTENER 09/20/17  Yes Shepperson, Kirstin, PA-C  levothyroxine (SYNTHROID, LEVOTHROID) 125 MCG tablet Take 1 tablet (125 mcg total) by mouth daily. 09/11/15  Yes Margarita Rana, MD  lisinopril (PRINIVIL,ZESTRIL) 40 MG tablet Take 1 tablet (40 mg total) by mouth daily. bedtime 05/13/15  Yes Margarita Rana, MD  omeprazole (PRILOSEC) 20 MG capsule  Take 1 capsule (20 mg total) by mouth daily. 04/15/15  Yes Margarita Rana, MD  simvastatin (ZOCOR) 20 MG tablet Take 1 tablet (20 mg total) by mouth daily. bedtime Patient taking differently: Take 20 mg by mouth daily at 6 PM. bedtime 05/13/15  Yes Margarita Rana, MD  vitamin B-12 (CYANOCOBALAMIN) 100 MCG tablet Take 100 mcg by mouth daily.   Yes [provider]  miconazole (MONISTAT 7) 2 % vaginal cream Apply twice a day to vulva as needed for irritation for the next 5-7 days 12/17/17   Zigmund Gottron, NP    Family History Family History  Problem Relation Age of Onset  . Heart disease Mother  CHF, CAD  . Hypertension Mother   . Mental illness Mother        Dementia  . Cancer Mother        breast, Brain Cancer  . Breast cancer Mother 40  . Cancer Father        lung  . Heart disease Father        MI  . Heart disease Sister   . Cancer Sister        Breast  . Barrett's esophagus Sister   . Hyperlipidemia Sister   . Breast cancer Sister 57  . Asthma Brother   . Heart disease Brother   . Diabetes Brother   . Anxiety disorder Brother   . Neuropathy Brother   . Neuropathy Sister   . Anxiety disorder Sister   . Mental illness Sister        Dementia  . Fibromyalgia Sister     Social History Social History   Tobacco Use  . Smoking status: Former Smoker    Packs/day: 0.50    Years: 20.00    Pack years: 10.00    Types: Cigarettes    Last attempt to quit: 06/20/1977    Years since quitting: 40.5  . Smokeless tobacco: Never Used  Substance Use Topics  . Alcohol use: Yes    Alcohol/week: 0.6 - 1.8 oz    Types: 1 - 2 Standard drinks or equivalent per week  . Drug use: No     Allergies   Carvedilol; Hctz [hydrochlorothiazide]; and Spironolactone   Review of Systems Review of Systems   Physical Exam Triage Vital Signs ED Triage Vitals  Enc Vitals Group     BP 12/17/17 0952 (!) 144/70     Pulse Rate 12/17/17 0952 61     Resp 12/17/17 0952 18      Temp 12/17/17 0952 98.1 F (36.7 C)     Temp Source 12/17/17 0952 Oral     SpO2 12/17/17 0952 98 %     Weight 12/17/17 0955 218 lb (98.9 kg)     Height --      Head Circumference --      Peak Flow --      Pain Score 12/17/17 0955 0     Pain Loc --      Pain Edu? --      Excl. in [place]? --    No data found.  Updated Vital Signs BP (!) 144/70 (BP Location: Right Arm)   Pulse 61   Temp 98.1 F (36.7 C) (Oral)   Resp 18   Wt 218 lb (98.9 kg)   SpO2 98%   BMI 39.87 kg/m    Physical Exam  Constitutional: She is oriented to person, place, and time. She appears well-developed and well-nourished. No distress.  Cardiovascular: Normal rate, regular rhythm and normal heart sounds.  Pulmonary/Chest: Effort normal and breath sounds normal.  Abdominal: There is no tenderness.  Genitourinary: There is rash on the right labia. There is rash on the left labia.  Genitourinary Comments: Redness to vulva with slight excoriation to left inner labia; mildly tender; thick white discharge noted  Neurological: She is alert and oriented to person, place, and time.  Skin: Skin is warm and dry.     UC Treatments / Results  Labs (all labs ordered are listed, but only abnormal results are displayed) Labs Reviewed  WET PREP, GENITAL - Abnormal; Notable for the following components:      Result Value   WBC,  Wet Prep HPF POC MODERATE (*)    All other components within normal limits    EKG None  Radiology No results found.  Procedures Procedures (including critical care time)  Medications Ordered in UC Medications - No data to display  Initial Impression / Assessment and Plan / UC Course  I have reviewed the triage vital signs and the nursing notes.  Pertinent labs & imaging results that were available during my care of the patient were reviewed by me and considered in my medical decision making (see chart for details).     Wet prep without findings of clue cells or yeast. External exam  remains consistent with yeast however, miconazole topical provided. Encouraged recheck with PCP, return sooner for any worsening of symptoms. Patient verbalized understanding and agreeable to plan.    Final Clinical Impressions(s) / UC Diagnoses   Final diagnoses:  Vulvovaginitis     Discharge Instructions     No yeast on test collection today, but concerning still for external yeast infection.  I have sent medication cream that you can apply to the vulva twice a day at least for the next 5 days.  Please follow up with your primary care provider for recheck, especially if no improvement or worsening of symptoms.     ED Prescriptions    Medication Sig Dispense Auth. Provider   miconazole (MONISTAT 7) 2 % vaginal cream Apply twice a day to vulva as needed for irritation for the next 5-7 days 45 g Augusto Gamble B, NP     Controlled Substance Prescriptions [place] Controlled Substance Registry consulted? Not Applicable   Zigmund Gottron, NP 12/17/17 1022

## 2017-12-17 NOTE — ED Triage Notes (Signed)
Pt has been on 2 rounds of antibiotics and finished completely two days ago and now thinks she has a yeast infections. Soreness and redness in her vulva. No discharge present.

## 2017-12-17 NOTE — Discharge Instructions (Signed)
No yeast on test collection today, but concerning still for external yeast infection.  I have sent medication cream that you can apply to the vulva twice a day at least for the next 5 days.  Please follow up with your primary care provider for recheck, especially if no improvement or worsening of symptoms.

## 2017-12-23 ENCOUNTER — Encounter: Payer: Self-pay | Admitting: Emergency Medicine

## 2017-12-23 ENCOUNTER — Ambulatory Visit
Admission: EM | Admit: 2017-12-23 | Discharge: 2017-12-23 | Disposition: A | Discharge status: Home / Self Care | Payer: Medicare Other | Attending: Family Medicine | Admitting: Family Medicine

## 2017-12-23 ENCOUNTER — Other Ambulatory Visit: Payer: Self-pay

## 2017-12-23 DIAGNOSIS — R300 Dysuria: Secondary | ICD-10-CM

## 2017-12-23 DIAGNOSIS — R030 Elevated blood-pressure reading, without diagnosis of hypertension: Secondary | ICD-10-CM

## 2017-12-23 DIAGNOSIS — N898 Other specified noninflammatory disorders of vagina: Secondary | ICD-10-CM

## 2017-12-23 DIAGNOSIS — I10 Essential (primary) hypertension: Secondary | ICD-10-CM

## 2017-12-23 DIAGNOSIS — R350 Frequency of micturition: Secondary | ICD-10-CM | POA: Diagnosis not present

## 2017-12-23 LAB — WET PREP, GENITAL
Clue Cells Wet Prep HPF POC: NONE SEEN
Sperm: NONE SEEN
TRICH WET PREP: NONE SEEN
Yeast Wet Prep HPF POC: NONE SEEN

## 2017-12-23 LAB — URINALYSIS, COMPLETE (UACMP) WITH MICROSCOPIC
BILIRUBIN URINE: NEGATIVE
Glucose, UA: NEGATIVE mg/dL
Ketones, ur: NEGATIVE mg/dL
NITRITE: NEGATIVE
PROTEIN: NEGATIVE mg/dL
SPECIFIC GRAVITY, URINE: 1.015 (ref 1.005–1.030)
SQUAMOUS EPITHELIAL / LPF: NONE SEEN (ref 0–5)
WBC, UA: >50 WBC/hpf (ref 0–5)
pH: 7 (ref 5.0–8.0)

## 2017-12-23 MED ORDER — FLUCONAZOLE 150 MG PO TABS: 150.0000 mg | ORAL_TABLET | Freq: Once | ORAL | 0 refills | Status: DC | PRN

## 2017-12-23 MED ORDER — CEPHALEXIN 500 MG PO CAPS: 500.0000 mg | ORAL_CAPSULE | Freq: Two times a day (BID) | ORAL | 0 refills | Status: AC

## 2017-12-23 NOTE — Discharge Instructions (Addendum)
Take medication as prescribed. Rest. Drink plenty of fluids.   Follow up with your urologist this week.   Follow up with your primary care physician this week as needed. Return to Urgent care for new or worsening concerns.

## 2017-12-23 NOTE — ED Triage Notes (Signed)
Patient states she has had a recurrent UTI and ended up with a yeast infection used the last of her monstat yesterday and started having UTI symptoms again last night.  Patient states her pain is intensified this afternoon and is having frequent urination as well

## 2017-12-23 NOTE — ED Provider Notes (Signed)
[facility] ____________________________________________  Time seen: Approximately 7:43 PM  I have reviewed the triage vital signs and the nursing notes.   HISTORY  Chief Complaint Recurrent UTI  HPI Barbara Hernandez is a 77 y.o. female presenting for reevaluation of recent vaginal irritation which has been improving, but reports today she began with urinary frequency and burning with urination.  Denies abdominal pain or atypical back pain.  No fevers.  States that she was seen in urgent care less than 1 week ago and was treated for vaginal yeast infection, and states that she has been using the topical cream which has been helping.  States that she feels like her vaginal skin irritation is much improved.  Patient states that she has previously had UTIs that feels similar.  Patient states that yesterday when she went to insert the Monistat she saw blood on the inserter and was not sure if it was urinary or vaginal.  Denies any other bleeding, but states that she may have seen some blood in her urine.  Has not seen any other vaginal or concern of vaginal bleeding.  Continues to eat and drink well.  States that she is scheduled for a cystoscopy with urology in July already.  Denies other aggravating alleviating factors.  Reports otherwise feels well. Denies chest pain, shortness of breath, abdominal pain. Denies recent sickness. Denies recent antibiotic use.    Hortencia Pilar, MD: PCP Urology: Erlene Quan    Past Medical History:  Diagnosis Date  . Anemia   . Arthritis   . Barrett's esophagus 2012   2010 upper endoscopy showed only reflux. 2012 biopsies suggested Barrett's epithelial changes.  . Bladder tumor   . BPPV (benign paroxysmal positional vertigo) 2004  . Family history of adverse reaction to anesthesia    sister had memory problems after delivery of child  . GERD (gastroesophageal reflux disease)   . H/O measles   . Heart murmur   . Hematuria   . Hematuria   .  Hemorrhoid   . Hemorrhoids   . History of chicken pox   . Hyperlipidemia   . Hypertension   . Hypothyroidism   . Joint pain   . Low sodium levels   . Mild left ventricular hypertrophy   . Onychomycosis   . Primary localized osteoarthritis of left knee 09/06/2017  . Reflux   . Reflux esophagitis   . 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 Sleep apnea 2011   Uses C-Pap machine  . Thyroid disease     Patient Active Problem List   Diagnosis Date Noted  . Perforation of sigmoid colon due to diverticulitis 09/26/2017  . Primary localized osteoarthritis of left knee 09/06/2017  . Primary localized osteoarthrosis of the knee, right 07/28/2016  . Benign essential hypertension 03/18/2015  . 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 Arthritis, degenerative 11/22/2014  . VFD (visual field defect) 11/22/2014  . Hypothyroidism 10/08/2014  . Barrett's esophagus 10/08/2014  . Hypercholesteremia 10/08/2014  . Hypertension 10/08/2014  . Hematuria 10/08/2014  . Onychomycosis 10/08/2014  . Mild left ventricular hypertrophy 10/08/2014  . Pernicious anemia 10/08/2014  . Vitamin D deficiency 10/08/2014  . Vertigo, benign paroxysmal 10/08/2014  . Systolic murmur 19/50/9326  . Sleep apnea 10/08/2014  . Arthritis of shoulder region, right, degenerative 10/08/2014  . Allergic rhinitis 10/08/2014  . 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 MI (mitral incompetence) 08/14/2014  . Esophageal reflux 09/07/2012  . Hemorrhoid     Past Surgical History:  Procedure Laterality Date  .  BLEPHAROPLASTY Bilateral 2015  . BREAST EXCISIONAL BIOPSY Left 1988   benign  . COLONOSCOPY W/ BIOPSIES N/A 01/16/2013   No source for GI blood loss, mild lymphatic prominence in the rectum.  . ESOPHAGOGASTRODUODENOSCOPY (EGD) WITH PROPOFOL N/A 03/15/2016   Procedure: ESOPHAGOGASTRODUODENOSCOPY (EGD) WITH PROPOFOL;  Surgeon: Robert Bellow, MD;  Location: [facility];  Service: Endoscopy;  Laterality: N/A;  . EYE  SURGERY Bilateral 2006   cataract  . Left wrist fracture    . POPLITEAL SYNOVIAL CYST EXCISION Right   . TOTAL KNEE ARTHROPLASTY Right 08/09/2016   Procedure: TOTAL KNEE ARTHROPLASTY;  Surgeon: Elsie Saas, MD;  Location: [place];  Service: Orthopedics;  Laterality: Right;  . TOTAL KNEE ARTHROPLASTY Left 09/19/2017   Procedure: TOTAL KNEE ARTHROPLASTY;  Surgeon: Elsie Saas, MD;  Location: [place];  Service: Orthopedics;  Laterality: Left;  . TRANSURETHRAL RESECTION OF BLADDER TUMOR N/A 11/20/2014   Procedure: TRANSURETHRAL RESECTION OF BLADDER TUMOR (TURBT);  Surgeon: Hollice Espy, MD;  Location: [facility];  Service: Urology;  Laterality: N/A;  . UPPER GI ENDOSCOPY  2012, 2014     No current facility-administered medications for this encounter.   Current Outpatient Medications:  .  acetaminophen (TYLENOL) 500 MG tablet, Take 500 mg by mouth every 6 (six) hours as needed for mild pain. , Disp: , Rfl:  .  Artificial Tear Solution (GENTEAL TEARS OP), Apply 1 application to eye at bedtime., Disp: , Rfl:  .  aspirin EC 81 MG tablet, Take 81 mg by mouth daily., Disp: , Rfl:  .  Calcium Carbonate-Vit D-Min (CALCIUM 1200 PO), Take 1 tablet by mouth daily. , Disp: , Rfl:  .  carboxymethylcellulose (REFRESH PLUS) 0.5 % SOLN, Place 1 drop into both eyes every morning. , Disp: , Rfl:  .  carvedilol (COREG) 6.25 MG tablet, Take 6.25 mg by mouth 2 (two) times daily with a meal., Disp: , Rfl:  .  celecoxib (CELEBREX) 200 MG capsule, Take 200 mg by mouth 2 (two) times daily., Disp: , Rfl:  .  Cholecalciferol (VITAMIN D3) 2000 UNITS TABS, Take 1 tablet by mouth every evening. bedtime, Disp: , Rfl:  .  docusate sodium (COLACE) 100 MG capsule, 1 tab 2 times a day while on narcotics.  STOOL SOFTENER, Disp: 60 capsule, Rfl: 0 .  levothyroxine (SYNTHROID, LEVOTHROID) 125 MCG tablet, Take 1 tablet (125 mcg total) by mouth daily., Disp: 30 tablet, Rfl: 3 .  lisinopril (PRINIVIL,ZESTRIL) 40 MG tablet, Take 1 tablet  (40 mg total) by mouth daily. bedtime, Disp: 90 tablet, Rfl: 3 .  omeprazole (PRILOSEC) 20 MG capsule, Take 1 capsule (20 mg total) by mouth daily., Disp: 90 capsule, Rfl: 3 .  cephALEXin (KEFLEX) 500 MG capsule, Take 1 capsule (500 mg total) by mouth 2 (two) times daily for 7 days., Disp: 14 capsule, Rfl: 0 .  fluconazole (DIFLUCAN) 150 MG tablet, Take 1 tablet (150 mg total) by mouth once as needed for up to 1 dose. Take one pill orally, then Repeat in one week as needed., Disp: 1 tablet, Rfl: 0 .  simvastatin (ZOCOR) 20 MG tablet, Take 1 tablet (20 mg total) by mouth daily. bedtime (Patient taking differently: Take 20 mg by mouth daily at 6 PM. bedtime), Disp: 90 tablet, Rfl: 3 .  vitamin B-12 (CYANOCOBALAMIN) 100 MCG tablet, Take 100 mcg by mouth daily., Disp: , Rfl:   Allergies Carvedilol; Hctz [hydrochlorothiazide]; and Spironolactone  Family History  Problem Relation Age of Onset  . Heart disease Mother  CHF, CAD  . Hypertension Mother   . Mental illness Mother        Dementia  . Cancer Mother        breast, Brain Cancer  . Breast cancer Mother 27  . Cancer Father        lung  . Heart disease Father        MI  . Heart disease Sister   . Cancer Sister        Breast  . Barrett's esophagus Sister   . Hyperlipidemia Sister   . Breast cancer Sister 66  . Asthma Brother   . Heart disease Brother   . Diabetes Brother   . Anxiety disorder Brother   . Neuropathy Brother   . Neuropathy Sister   . Anxiety disorder Sister   . Mental illness Sister        Dementia  . Fibromyalgia Sister     Social History Social History   Tobacco Use  . Smoking status: Former Smoker    Packs/day: 0.50    Years: 20.00    Pack years: 10.00    Types: Cigarettes    Last attempt to quit: 06/20/1977    Years since quitting: 40.5  . Smokeless tobacco: Never Used  Substance Use Topics  . Alcohol use: Yes    Alcohol/week: 0.6 - 1.8 oz    Types: 1 - 2 Standard drinks or equivalent per  week  . Drug use: No    Review of Systems Constitutional: No fever/chills Cardiovascular: Denies chest pain. Respiratory: Denies shortness of breath. Gastrointestinal: No abdominal pain.  No nausea, no vomiting.  No diarrhea.  No constipation. Genitourinary: As above.  Musculoskeletal: Negative for atypical back pain.   ____________________________________________   PHYSICAL EXAM:  VITAL SIGNS: ED Triage Vitals  Enc Vitals Group     BP 12/23/17 1915 (!) 170/82     Pulse Rate 12/23/17 1915 79     Resp 12/23/17 1915 18     Temp 12/23/17 1915 98.1 F (36.7 C)     Temp Source 12/23/17 1915 Oral     SpO2 12/23/17 1915 98 %     Weight 12/23/17 1918 218 lb (98.9 kg)     Height 12/23/17 1918 5\' 2"  (1.575 m)     Head Circumference --      Peak Flow --      Pain Score 12/23/17 1918 7     Pain Loc --      Pain Edu? --      Excl. in [place]? --     Constitutional: Alert and oriented. Well appearing and in no acute distress. ENT      Head: Normocephalic and atraumatic. Cardiovascular: Normal rate, regular rhythm. Grossly normal heart sounds.  Good peripheral circulation. Respiratory: Normal respiratory effort without tachypnea nor retractions. Breath sounds are clear and equal bilaterally. No wheezes, rales, rhonchi. Gastrointestinal: Soft and nontender.  No CVA tenderness. Pelvic: Exam completed with Paula centimeters at bedside as chaperone. External: Minimal vulvar irritation, no surrounding erythema, no excoriation, no discharge, no edema.  Speculum: Cervical os visible, no bleeding, no discharge noted. Musculoskeletal:  Steady gait.  Neurologic:  Normal speech and language. Speech is normal. No gait instability.  Skin:  Skin is warm, dry.  Psychiatric: Mood and affect are normal. Speech and behavior are normal. Patient exhibits appropriate insight and judgment   ___________________________________________   LABS (all labs ordered are listed, but only abnormal results are  displayed)  Labs Reviewed  WET  PREP, GENITAL - Abnormal; Notable for the following components:      Result Value   WBC, Wet Prep HPF POC MODERATE (*)    All other components within normal limits  URINALYSIS, COMPLETE (UACMP) WITH MICROSCOPIC - Abnormal; Notable for the following components:   Color, Urine STRAW (*)    APPearance CLOUDY (*)    Hgb urine dipstick MODERATE (*)    Leukocytes, UA LARGE (*)    Bacteria, UA FEW (*)    All other components within normal limits  URINE CULTURE    PROCEDURES Procedures    INITIAL IMPRESSION / ASSESSMENT AND PLAN / ED COURSE  Pertinent labs & imaging results that were available during my care of the patient were reviewed by me and considered in my medical decision making (see chart for details).  Well-appearing patient.  No acute distress.  Patient has recently been seen and treated for suspected yeast vaginitis in which she reports is improving, however today more dysuria all symptoms.  Urinalysis reviewed, suspect UTI.  Will culture.  As patient had reported she had one episode yesterday of unclear hematuria versus vaginal bleeding, pelvic exam was completed.  No vaginal bleeding noted.  Will treat patient on oral Keflex, Diflucan single tablet as needed.  Close follow-up with primary care and urology.  Patient blood pressure was elevated tonight, in office, but patient reports she has not yet taken her carvedilol and will do so soon as she returns home.  Counseled regarding monitoring keeping a journal, states that she has been doing this and her blood pressure is been fine except for tonight missing medicine.Discussed indication, risks and benefits of medications with patient.  Discussed follow up with Primary care physician this week. Discussed follow up and return parameters including no resolution or any worsening concerns. Patient verbalized understanding and agreed to plan.   ____________________________________________   FINAL CLINICAL  IMPRESSION(S) / ED DIAGNOSES  Final diagnoses:  Dysuria  Vaginal irritation  Elevated blood pressure reading     ED Discharge Orders        Ordered    cephALEXin (KEFLEX) 500 MG capsule  2 times daily     12/23/17 2006    fluconazole (DIFLUCAN) 150 MG tablet  Once PRN     12/23/17 2006       Note: This dictation was prepared with Dragon dictation along with smaller phrase technology. Any transcriptional errors that result from this process are unintentional.         Marylene Land, NP 12/23/17 2019

## 2017-12-23 NOTE — ED Triage Notes (Signed)
I rechecked patient's bp with a manual large cuff and it was 172/78. Patient states that she hasn't taken her 2nd dose of Carvedilol tonight. Her last dose was at [place], NP notified of result.

## 2017-12-24 ENCOUNTER — Encounter: Payer: Self-pay | Admitting: Urology

## 2017-12-26 ENCOUNTER — Telehealth (HOSPITAL_COMMUNITY): Payer: Self-pay

## 2017-12-26 LAB — URINE CULTURE: Culture: 20000 — AB

## 2017-12-26 NOTE — Telephone Encounter (Signed)
Urine culture positive for E.Coli, this was treated with Keflex at urgent care visit. Pt contacted and made aware.

## 2017-12-27 ENCOUNTER — Other Ambulatory Visit: Payer: Self-pay

## 2017-12-27 DIAGNOSIS — N952 Postmenopausal atrophic vaginitis: Secondary | ICD-10-CM

## 2017-12-27 MED ORDER — ESTROGENS, CONJUGATED 0.625 MG/GM VA CREA: TOPICAL_CREAM | VAGINAL | 12 refills | Status: DC

## 2017-12-28 ENCOUNTER — Ambulatory Visit: Payer: Medicare Other | Admitting: Gastroenterology

## 2017-12-28 ENCOUNTER — Encounter: Payer: Self-pay | Admitting: Gastroenterology

## 2017-12-28 VITALS — BP 155/79 | HR 58 | Ht 64.0 in | Wt 220.0 lb

## 2017-12-28 DIAGNOSIS — K5720 Diverticulitis of large intestine with perforation and abscess without bleeding: Secondary | ICD-10-CM

## 2017-12-28 DIAGNOSIS — K22719 Barrett's esophagus with dysplasia, unspecified: Secondary | ICD-10-CM

## 2017-12-28 DIAGNOSIS — K5792 Diverticulitis of intestine, part unspecified, without perforation or abscess without bleeding: Secondary | ICD-10-CM

## 2017-12-28 NOTE — Patient Instructions (Addendum)
You are scheduled for a CT abdomen/pelvis with contrast at [facility] on Friday, July 19th at 8:00am. Please arrive at the medical mall registration desk at 7:45am. You cannot have anything to eat or drink 4 hours prior to the scan. You will need to pick up your contrast media today. They will instruct you on how to take prior to your scan.   If you need to reschedule this appointment for any reason, please contact central scheduling at [phone].

## 2017-12-28 NOTE — Progress Notes (Signed)
Gastroenterology Consultation  Referring Provider:     Hortencia Pilar, MD Primary Care Physician:  Hortencia Pilar, MD Primary Gastroenterologist:  Dr. Allen Norris     Reason for Consultation:     Diverticulitis        HPI:   Barbara Hernandez is a 77 y.o. y/o female referred for consultation & management of Diverticulitis by Dr. Hortencia Pilar, MD.  This patient comes in today after being followed by Dr. Vira Agar in 2010 and 2012.  The patient then had a colonoscopy by Dr. Bary Castilla in 2014. In 2017 the patient had an upper endoscopy again by Dr. Bary Castilla for a history of Barrett's esophagus.  At that time the patient had biopsies of the esophagus and was reported to have findings consistent with short segment Barrett's. Upper endoscopy in 2014 there was intestinal metaplasia but none of the most recent upper endoscopy biopsy showed any sign of intestinal metaplasia and in fact one of the biopsies showed normal esophageal tissue. In April the patient had increased constipation and was sent for a CT scan.  The CT scan showed:  IMPRESSION: 1. Segmental thickening of the sigmoid colon with a few scattered colonic diverticula noted. Suspect this is the cause of the patient's free intraperitoneal air. Pertinent negatives would include no bowel obstruction or abscess. No free fluid.  The patient was treated conservatively with antibiotics and hydration.  The patient now reports that she has been having passage of gas when she urinates and is set up for a cystoscopy at the end of July.  She reports that it was happening frequently and then she was put on antibiotics and this was done because she was found to have a UTI.  The patient states that the passage of gas when she urinates is less frequent now and only happens every few days and seems to be coming less frequent.  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  The patient was told by her surgeon to follow-up with GI for repeat colonoscopy  and EGD due to her history of Barrett's and recent history of a diverticular perforation  Past Medical History:  Diagnosis Date  . Anemia   . Arthritis   . Barrett's esophagus 2012   2010 upper endoscopy showed only reflux. 2012 biopsies suggested Barrett's epithelial changes.  . Bladder tumor   . BPPV (benign paroxysmal positional vertigo) 2004  . Family history of adverse reaction to anesthesia    sister had memory problems after delivery of child  . GERD (gastroesophageal reflux disease)   . H/O measles   . Heart murmur   . Hematuria   . Hematuria   . Hemorrhoid   . Hemorrhoids   . History of chicken pox   . Hyperlipidemia   . Hypertension   . Hypothyroidism   . Joint pain   . Low sodium levels   . Mild left ventricular hypertrophy   . Onychomycosis   . Primary localized osteoarthritis of left knee 09/06/2017  . Reflux   . Reflux esophagitis   . 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 Sleep apnea 2011   Uses C-Pap machine  . Thyroid disease     Past Surgical History:  Procedure Laterality Date  . BLEPHAROPLASTY Bilateral 2015  . BREAST EXCISIONAL BIOPSY Left 1988   benign  . COLONOSCOPY W/ BIOPSIES N/A 01/16/2013   No source for GI blood loss, mild lymphatic prominence in the rectum.  . ESOPHAGOGASTRODUODENOSCOPY (EGD) WITH PROPOFOL  N/A 03/15/2016   Procedure: ESOPHAGOGASTRODUODENOSCOPY (EGD) WITH PROPOFOL;  Surgeon: Robert Bellow, MD;  Location: [facility];  Service: Endoscopy;  Laterality: N/A;  . EYE SURGERY Bilateral 2006   cataract  . Left wrist fracture    . POPLITEAL SYNOVIAL CYST EXCISION Right   . TOTAL KNEE ARTHROPLASTY Right 08/09/2016   Procedure: TOTAL KNEE ARTHROPLASTY;  Surgeon: Elsie Saas, MD;  Location: [place];  Service: Orthopedics;  Laterality: Right;  . TOTAL KNEE ARTHROPLASTY Left 09/19/2017   Procedure: TOTAL KNEE ARTHROPLASTY;  Surgeon: Elsie Saas, MD;  Location: [place];  Service: Orthopedics;  Laterality: Left;  .  TRANSURETHRAL RESECTION OF BLADDER TUMOR N/A 11/20/2014   Procedure: TRANSURETHRAL RESECTION OF BLADDER TUMOR (TURBT);  Surgeon: Hollice Espy, MD;  Location: [facility];  Service: Urology;  Laterality: N/A;  . UPPER GI ENDOSCOPY  2012, 2014    Prior to Admission medications   Medication Sig Start Date End Date Taking? Authorizing Provider  acetaminophen (TYLENOL) 500 MG tablet Take 500 mg by mouth every 6 (six) hours as needed for mild pain.     [provider]  Artificial Tear Solution (GENTEAL TEARS OP) Apply 1 application to eye at bedtime.    [provider]  aspirin EC 81 MG tablet Take 81 mg by mouth daily.    [provider]  Calcium Carbonate-Vit D-Min (CALCIUM 1200 PO) Take 1 tablet by mouth daily.     [provider]  carboxymethylcellulose (REFRESH PLUS) 0.5 % SOLN Place 1 drop into both eyes every morning.     [provider]  carvedilol (COREG) 6.25 MG tablet Take 6.25 mg by mouth 2 (two) times daily with a meal.    [provider]  celecoxib (CELEBREX) 200 MG capsule Take 200 mg by mouth 2 (two) times daily.    [provider]  cephALEXin (KEFLEX) 500 MG capsule Take 1 capsule (500 mg total) by mouth 2 (two) times daily for 7 days. 12/23/17 12/30/17  Marylene Land, NP  Cholecalciferol (VITAMIN D3) 2000 UNITS TABS Take 1 tablet by mouth every evening. bedtime    [provider]  conjugated estrogens (PREMARIN) vaginal cream Apply pea sized amount 3 times weekly 12/27/17   Hollice Espy, MD  docusate sodium (COLACE) 100 MG capsule 1 tab 2 times a day while on narcotics.  STOOL SOFTENER 09/20/17   Shepperson, Kirstin, PA-C  fluconazole (DIFLUCAN) 150 MG tablet Take 1 tablet (150 mg total) by mouth once as needed for up to 1 dose. Take one pill orally, then Repeat in one week as needed. 12/23/17   Marylene Land, NP  levothyroxine (SYNTHROID, LEVOTHROID) 125 MCG tablet Take 1 tablet (125 mcg total) by mouth daily. 09/11/15    Margarita Rana, MD  lisinopril (PRINIVIL,ZESTRIL) 40 MG tablet Take 1 tablet (40 mg total) by mouth daily. bedtime 05/13/15   Margarita Rana, MD  omeprazole (PRILOSEC) 20 MG capsule Take 1 capsule (20 mg total) by mouth daily. 04/15/15   Margarita Rana, MD  simvastatin (ZOCOR) 20 MG tablet Take 1 tablet (20 mg total) by mouth daily. bedtime Patient taking differently: Take 20 mg by mouth daily at 6 PM. bedtime 05/13/15   Margarita Rana, MD  vitamin B-12 (CYANOCOBALAMIN) 100 MCG tablet Take 100 mcg by mouth daily.    [provider]    Family History  Problem Relation Age of Onset  . Heart disease Mother        CHF, CAD  . Hypertension Mother   .  Mental illness Mother        Dementia  . Cancer Mother        breast, Brain Cancer  . Breast cancer Mother 70  . Cancer Father        lung  . Heart disease Father        MI  . Heart disease Sister   . Cancer Sister        Breast  . Barrett's esophagus Sister   . Hyperlipidemia Sister   . Breast cancer Sister 59  . Asthma Brother   . Heart disease Brother   . Diabetes Brother   . Anxiety disorder Brother   . Neuropathy Brother   . Neuropathy Sister   . Anxiety disorder Sister   . Mental illness Sister        Dementia  . Fibromyalgia Sister      Social History   Tobacco Use  . Smoking status: Former Smoker    Packs/day: 0.50    Years: 20.00    Pack years: 10.00    Types: Cigarettes    Last attempt to quit: 06/20/1977    Years since quitting: 40.5  . Smokeless tobacco: Never Used  Substance Use Topics  . Alcohol use: Yes    Alcohol/week: 0.6 - 1.8 oz    Types: 1 - 2 Standard drinks or equivalent per week  . Drug use: No    Allergies as of 12/28/2017 - Review Complete 12/23/2017  Allergen Reaction Noted  . Carvedilol Other (See Comments) 08/30/2017  . Hctz [hydrochlorothiazide] Other (See Comments) 08/30/2017  . Spironolactone Other (See Comments) 12/01/2017    Review of Systems:    All systems reviewed  and negative except where noted in HPI.   Physical Exam:  There were no vitals taken for this visit. No LMP recorded. Patient is postmenopausal. General:   Alert,  Well-developed, well-nourished, pleasant and cooperative in NAD Head:  Normocephalic and atraumatic. Eyes:  Sclera clear, no icterus.   Conjunctiva pink. Ears:  Normal auditory acuity. Nose:  No deformity, discharge, or lesions. Mouth:  No deformity or lesions,oropharynx pink & moist. Neck:  Supple; no masses or thyromegaly. Lungs:  Respirations even and unlabored.  Clear throughout to auscultation.   No wheezes, crackles, or rhonchi. No acute distress. Heart:  Regular rate and rhythm; no murmurs, clicks, rubs, or gallops. Abdomen:  Normal bowel sounds.  No bruits.  Soft, non-tender and non-distended without masses, hepatosplenomegaly or hernias noted.  No guarding or rebound tenderness.  Negative Carnett sign.   Rectal:  Deferred.  Msk:  Symmetrical without gross deformities.  Good, equal movement & strength bilaterally. Pulses:  Normal pulses noted. Extremities:  No clubbing or edema.  No cyanosis. Neurologic:  Alert and oriented x3;  grossly normal neurologically. Skin:  Intact without significant lesions or rashes.  No jaundice. Lymph Nodes:  No significant cervical adenopathy. Psych:  Alert and cooperative. Normal mood and affect.  Imaging Studies: No results found.  Assessment and Plan:   Barbara Hernandez is a 77 y.o. y/o female who was in the hospital and April for a diverticular perforation with free air and she was treated conservatively.  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  The patient has also been treated with antibiotics for recurrent UTIs since the diverticulitis attack.  The patient will be set up for CT scan of the abdomen to make sure she has not  developed a abscess prior to doing any endoscopic procedures on her.  The patient is  also set up for a cystoscopy at the end of July.  Depending on the results of the cystoscopy and CT scan the patient should undergo a colonoscopy and EGD due to her history of Barrett's esophagus and recent bout of diverticulitis.  I believe these should be done after she has been cleared by urology for a possible fistula and that the CT scan does not show any intra-abdominal abscess formation after her recent diverticulitis attack.  The patient has been explained the plan and agrees with it.  Lucilla Lame, MD. Marval Regal    Note: This dictation was prepared with Dragon dictation along with smaller phrase technology. Any transcriptional errors that result from this process are unintentional.

## 2018-01-02 ENCOUNTER — Other Ambulatory Visit: Payer: Self-pay

## 2018-01-02 MED ORDER — CEPHALEXIN 500 MG PO CAPS: 500.0000 mg | ORAL_CAPSULE | Freq: Once | ORAL | 0 refills | Status: AC

## 2018-01-04 ENCOUNTER — Telehealth: Payer: Self-pay

## 2018-01-04 NOTE — Telephone Encounter (Signed)
Pt called office stating that she received Premarin cream, she states she never asked for it and she is being charged. I informed pt that I believe the medication was sent in accidentally to her instead of another pt. I called and spoke with [facility] they are initiating a return for the pt to correct this mistake. I called pt to inform her I have initiated this corrective action, she needs to contact [facility] to verify information and they will help her.

## 2018-01-04 NOTE — Telephone Encounter (Signed)
Opened in error

## 2018-01-06 ENCOUNTER — Ambulatory Visit
Admission: RE | Admit: 2018-01-06 | Discharge: 2018-01-06 | Disposition: A | Discharge status: Home / Self Care | Payer: Medicare Other | Source: Ambulatory Visit | Attending: Gastroenterology | Admitting: Gastroenterology

## 2018-01-06 DIAGNOSIS — K5792 Diverticulitis of intestine, part unspecified, without perforation or abscess without bleeding: Secondary | ICD-10-CM | POA: Diagnosis not present

## 2018-01-06 DIAGNOSIS — K5730 Diverticulosis of large intestine without perforation or abscess without bleeding: Secondary | ICD-10-CM | POA: Insufficient documentation

## 2018-01-06 DIAGNOSIS — N321 Vesicointestinal fistula: Secondary | ICD-10-CM | POA: Diagnosis not present

## 2018-01-06 DIAGNOSIS — N3081 Other cystitis with hematuria: Secondary | ICD-10-CM | POA: Diagnosis not present

## 2018-01-06 LAB — POCT I-STAT CREATININE: Creatinine, Ser: 0.7 mg/dL (ref 0.44–1.00)

## 2018-01-06 MED ORDER — IOHEXOL 300 MG/ML  SOLN
100.0000 mL | Freq: Once | INTRAMUSCULAR | Status: AC | PRN
  Administered 2018-01-06: 100 mL via INTRAVENOUS

## 2018-01-09 ENCOUNTER — Telehealth: Payer: Self-pay | Admitting: Urology

## 2018-01-09 ENCOUNTER — Telehealth: Payer: Self-pay

## 2018-01-09 DIAGNOSIS — N321 Vesicointestinal fistula: Secondary | ICD-10-CM

## 2018-01-09 MED ORDER — METRONIDAZOLE 500 MG PO TABS: 500.0000 mg | ORAL_TABLET | Freq: Three times a day (TID) | ORAL | 0 refills | Status: DC

## 2018-01-09 MED ORDER — CIPROFLOXACIN HCL 500 MG PO TABS: 500.0000 mg | ORAL_TABLET | Freq: Two times a day (BID) | ORAL | 0 refills | Status: DC

## 2018-01-09 NOTE — Telephone Encounter (Signed)
Thank you.  Case discussed with Dr. Dahlia Byes today will be seeing her on Wednesday.    Hollice Espy, MD

## 2018-01-09 NOTE — Telephone Encounter (Signed)
Patient notified

## 2018-01-09 NOTE — Telephone Encounter (Signed)
Pt notified of CT scan results.

## 2018-01-09 NOTE — Telephone Encounter (Signed)
Pt states she had a CT on Friday, the ordering Dr Allen Norris advised pt to be seen before her upcoming appt on Tues 7/30 with Dr. Erlene Quan. Pt asking about being seen before this coming Tuesday, Pt states she also has questions for the nurse and would like to speak to Dr. Cherrie Gauze assistant. Pt didn't want to give specifics. Please advise. Thanks.

## 2018-01-09 NOTE — Telephone Encounter (Signed)
Patient has been notified and app has been made ° ° °Barbara Hernandez °

## 2018-01-09 NOTE — Telephone Encounter (Signed)
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  I'd like to go ahead and place the patient on Cipro/Flagyl which I will send to her pharmacy and should be seen urgently, possibly this week by general surgery to assess whether or not we should pursue surgical intervention immediately versus percutaneous of the abscess.  If she develops fevers or chills in the interim, she should go to the emergency room for more urgent evaluation.  Hollice Espy, MD

## 2018-01-09 NOTE — Telephone Encounter (Signed)
-----   Message from Lucilla Lame, MD sent at 01/09/2018  8:06 AM EDT ----- Hi, Got a CT scan on this patient of ours and saw the abscess on the report. I think she is due for a procedure with you in the future. Did not know how you wanted to proceed. Thanks Darren ----- Message ----- From: Interface, Rad Results In Sent: 01/06/2018   1:44 PM To: Lucilla Lame, MD

## 2018-01-09 NOTE — Telephone Encounter (Signed)
-----   Message from Lucilla Lame, MD sent at 01/09/2018  8:06 AM EDT ----- Let the patient know that the CT showed the fistula and an abscess in the bladder. I will be forwarding the report to her urologist. If the patient does nothear from them in a few days she should call them.

## 2018-01-11 ENCOUNTER — Ambulatory Visit: Payer: Medicare Other | Admitting: Surgery

## 2018-01-11 ENCOUNTER — Other Ambulatory Visit: Payer: Self-pay

## 2018-01-11 ENCOUNTER — Encounter: Payer: Self-pay | Admitting: Surgery

## 2018-01-11 ENCOUNTER — Ambulatory Visit (INDEPENDENT_AMBULATORY_CARE_PROVIDER_SITE_OTHER): Payer: Medicare Other | Admitting: Surgery

## 2018-01-11 VITALS — BP 157/83 | HR 67 | Temp 97.7°F | Ht 64.0 in | Wt 217.6 lb

## 2018-01-11 DIAGNOSIS — N321 Vesicointestinal fistula: Secondary | ICD-10-CM

## 2018-01-11 DIAGNOSIS — K632 Fistula of intestine: Secondary | ICD-10-CM

## 2018-01-11 MED ORDER — NA SULFATE-K SULFATE-MG SULF 17.5-3.13-1.6 GM/177ML PO SOLN
1.0000 | ORAL | 0 refills | Status: DC
Start: 2018-01-11 — End: 2018-02-15

## 2018-01-11 NOTE — Patient Instructions (Signed)
Please pick up your bowel prep at the pharmacy. Please see your instructions for the bowel prep.    Please see your follow up appointment listed below.

## 2018-01-12 ENCOUNTER — Encounter: Payer: Self-pay | Admitting: *Deleted

## 2018-01-12 ENCOUNTER — Other Ambulatory Visit: Payer: Self-pay

## 2018-01-13 ENCOUNTER — Encounter: Payer: Self-pay | Admitting: Surgery

## 2018-01-13 NOTE — Progress Notes (Signed)
Patient ID: Barbara Hernandez, female   DOB: May 24, 1941, 77 y.o.   MRN: [id number]  HPI Barbara Hernandez is a 77 y.o. female seen in consultation at the request of Dr. Erlene Quan for a colovesical fistula.  He reports that she has been having recurrent UTIs and more recent has experienced some pneumaturia.  She denies any abdominal pain no nausea no vomiting.  She did have an episode of diverticulitis several years ago w contained perforation requiring hospitalization.  Did have a colonoscopy approximately 5 years ago without any major findings.  He recently underwent a CT scan of the abdomen and pelvis that I have personally review showing a small abscess between the sigmoid colon and the urinary bladder.  There is also evidence of air within the bladder consistent with a fistula.  No free air. Her CMP was normal except the sodium that was in the 130s.  Her CBC was normal  HPI  Past Medical History:  Diagnosis Date  . Anemia   . Arthritis   . Barrett's esophagus 2012   2010 upper endoscopy showed only reflux. 2012 biopsies suggested Barrett's epithelial changes.  . Bladder tumor   . BPPV (benign paroxysmal positional vertigo) 2004  . 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 Family history of adverse reaction to anesthesia    sister had memory problems after delivery of child  . GERD (gastroesophageal reflux disease)   . H/O measles   . Heart murmur   . Hematuria   . Hematuria   . Hemorrhoid   . Hemorrhoids   . History of chicken pox   . Hyperlipidemia   . Hypertension   . Hypothyroidism   . Joint pain   . Low sodium levels   . Mild left ventricular hypertrophy   . Onychomycosis   . Perforation of sigmoid colon due to diverticulitis 09/26/2017  . Primary localized osteoarthritis of left knee 09/06/2017  . Reflux   . Reflux esophagitis   . 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 Sleep apnea 2011   Uses C-Pap machine  . Thyroid disease   . Wears dentures    full upper and lower     Past Surgical History:  Procedure Laterality Date  . BLEPHAROPLASTY Bilateral 2015  . BREAST EXCISIONAL BIOPSY Left 1988   benign  . COLONOSCOPY W/ BIOPSIES N/A 01/16/2013   No source for GI blood loss, mild lymphatic prominence in the rectum.  . ESOPHAGOGASTRODUODENOSCOPY (EGD) WITH PROPOFOL N/A 03/15/2016   Procedure: ESOPHAGOGASTRODUODENOSCOPY (EGD) WITH PROPOFOL;  Surgeon: Robert Bellow, MD;  Location: [facility];  Service: Endoscopy;  Laterality: N/A;  . EYE SURGERY Bilateral 2006   cataract  . Left wrist fracture    . POPLITEAL SYNOVIAL CYST EXCISION Right   . TOTAL KNEE ARTHROPLASTY Right 08/09/2016   Procedure: TOTAL KNEE ARTHROPLASTY;  Surgeon: Elsie Saas, MD;  Location: [place];  Service: Orthopedics;  Laterality: Right;  . TOTAL KNEE ARTHROPLASTY Left 09/19/2017   Procedure: TOTAL KNEE ARTHROPLASTY;  Surgeon: Elsie Saas, MD;  Location: [place];  Service: Orthopedics;  Laterality: Left;  . TRANSURETHRAL RESECTION OF BLADDER TUMOR N/A 11/20/2014   Procedure: TRANSURETHRAL RESECTION OF BLADDER TUMOR (TURBT);  Surgeon: Hollice Espy, MD;  Location: [facility];  Service: Urology;  Laterality: N/A;  . UPPER GI ENDOSCOPY  2012, 2014    Family History  Problem Relation Age of Onset  . Heart disease Mother        CHF, CAD  . Hypertension Mother   .  Mental illness Mother        Dementia  . Cancer Mother        breast, Brain Cancer  . Breast cancer Mother 65  . Cancer Father        lung  . Heart disease Father        MI  . Heart disease Sister   . Cancer Sister        Breast  . Barrett's esophagus Sister   . Hyperlipidemia Sister   . Breast cancer Sister 8  . Asthma Brother   . Heart disease Brother   . Diabetes Brother   . Anxiety disorder Brother   . Neuropathy Brother   . Neuropathy Sister   . Anxiety disorder Sister   . Mental illness Sister        Dementia  . Fibromyalgia Sister     Social History Social History   Tobacco Use  . Smoking  status: Former Smoker    Packs/day: 0.50    Years: 20.00    Pack years: 10.00    Types: Cigarettes    Last attempt to quit: 06/20/1977    Years since quitting: 40.5  . Smokeless tobacco: Never Used  Substance Use Topics  . Alcohol use: Yes    Alcohol/week: 0.6 - 1.8 oz    Types: 1 - 2 Standard drinks or equivalent per week  . Drug use: No    Allergies  Allergen Reactions  . Carvedilol Other (See Comments)    No energy, fatigued - higher doses  . Hctz [Hydrochlorothiazide] Other (See Comments)    Abnormal labs  . Spironolactone Other (See Comments)    Hyponatremia    Current Outpatient Medications  Medication Sig Dispense Refill  . acetaminophen (TYLENOL) 500 MG tablet Take 500 mg by mouth every 6 (six) hours as needed for mild pain.     . Artificial Tear Solution (GENTEAL TEARS OP) Apply 1 application to eye at bedtime.    Marland Kitchen aspirin EC 81 MG tablet Take 81 mg by mouth daily.    . Calcium Carbonate-Vit D-Min (CALCIUM 1200 PO) Take 1 tablet by mouth daily.     . carboxymethylcellulose (REFRESH PLUS) 0.5 % SOLN Place 1 drop into both eyes every morning.     . carvedilol (COREG) 6.25 MG tablet Take 6.25 mg by mouth 2 (two) times daily with a meal.    . celecoxib (CELEBREX) 200 MG capsule Take 200 mg by mouth 2 (two) times daily.    . Cholecalciferol (VITAMIN D3) 2000 UNITS TABS Take 1 tablet by mouth every evening. bedtime    . ciprofloxacin (CIPRO) 500 MG tablet Take 1 tablet (500 mg total) by mouth 2 (two) times daily. 14 tablet 0  . conjugated estrogens (PREMARIN) vaginal cream Apply pea sized amount 3 times weekly (Patient not taking: Reported on 01/12/2018) 42.5 g 12  . docusate sodium (COLACE) 100 MG capsule 1 tab 2 times a day while on narcotics.  STOOL SOFTENER 60 capsule 0  . fluconazole (DIFLUCAN) 150 MG tablet Take 1 tablet (150 mg total) by mouth once as needed for up to 1 dose. Take one pill orally, then Repeat in one week as needed. 1 tablet 0  . levothyroxine  (SYNTHROID, LEVOTHROID) 125 MCG tablet Take 1 tablet (125 mcg total) by mouth daily. 30 tablet 3  . lisinopril (PRINIVIL,ZESTRIL) 40 MG tablet Take 1 tablet (40 mg total) by mouth daily. bedtime 90 tablet 3  . metroNIDAZOLE (FLAGYL) 500 MG tablet Take  1 tablet (500 mg total) by mouth 3 (three) times daily. 21 tablet 0  . Na Sulfate-K Sulfate-Mg Sulf (SUPREP BOWEL PREP KIT) 17.5-3.13-1.6 GM/177ML SOLN Take 1 kit by mouth as directed. 1 Bottle 0  . omeprazole (PRILOSEC) 20 MG capsule Take 1 capsule (20 mg total) by mouth daily. 90 capsule 3  . simvastatin (ZOCOR) 20 MG tablet Take 1 tablet (20 mg total) by mouth daily. bedtime (Patient taking differently: Take 20 mg by mouth daily at 6 PM. bedtime) 90 tablet 3  . vitamin B-12 (CYANOCOBALAMIN) 100 MCG tablet Take 100 mcg by mouth daily.     No current facility-administered medications for this visit.      Review of Systems Full ROS  was asked and was negative except for the information on the HPI  Physical Exam Blood pressure (!) 157/83, pulse 67, temperature 97.7 F (36.5 C), temperature source Oral, height '5\' 4"'  (1.626 m), weight 217 lb 9.6 oz (98.7 kg). CONSTITUTIONAL: NAD EYES: Pupils are equal, round, and reactive to light, Sclera are non-icteric. EARS, NOSE, MOUTH AND THROAT: The oropharynx is clear. The oral mucosa is pink and moist. Hearing is intact to voice. LYMPH NODES:  Lymph nodes in the neck are normal. RESPIRATORY:  Lungs are clear. There is normal respiratory effort, with equal breath sounds bilaterally, and without pathologic use of accessory muscles. CARDIOVASCULAR: Heart is regular without murmurs, gallops, or rubs. GI: The abdomen is  soft, nontender, and nondistended. There are no palpable masses. There is no hepatosplenomegaly. There are normal bowel sounds in all quadrants. GU: Rectal deferred.   MUSCULOSKELETAL: Normal muscle strength and tone. No cyanosis or edema.   SKIN: Turgor is good and there are no pathologic  skin lesions or ulcers. NEUROLOGIC: Motor and sensation is grossly normal. Cranial nerves are grossly intact. PSYCH:  Oriented to person, place and time. Affect is normal.  Data Reviewed  I have personally reviewed the patient's imaging, laboratory findings and medical records.    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 Discussed the case in detail with both Dr. Erlene Quan and Dr.Wohl.  Definitely a sigmoid colectomy is indicated at this time.  We will arrange for full colonoscopy to rule out any synchronous lesions.  We will perform this as a combined case with Dr. Erlene Quan from urology in case the needs to be a bladder resection to address the fistula.  Discussed with the patient in detail about the procedure.  Risk benefit and possible complications including but not limited to: Bleeding, infection, injury to adjacent structures including ureter, bladder and intestines.  She understands and wishes to proceed.  We will tentatively schedule her for upper scopic sigmoid colectomy pending the full colonoscopy report. A copy of this report will be sent to the referring provider  Caroleen Hamman, MD FACS General Surgeon 01/13/2018, 1:25 PM

## 2018-01-17 ENCOUNTER — Encounter: Payer: Self-pay | Admitting: Urology

## 2018-01-17 ENCOUNTER — Ambulatory Visit: Payer: Medicare Other | Admitting: Urology

## 2018-01-17 ENCOUNTER — Telehealth: Payer: Self-pay | Admitting: Surgery

## 2018-01-17 VITALS — BP 164/45 | HR 73 | Ht 62.0 in | Wt 217.0 lb

## 2018-01-17 DIAGNOSIS — Z8551 Personal history of malignant neoplasm of bladder: Secondary | ICD-10-CM

## 2018-01-17 DIAGNOSIS — N321 Vesicointestinal fistula: Secondary | ICD-10-CM | POA: Diagnosis not present

## 2018-01-17 MED ORDER — CIPROFLOXACIN HCL 250 MG PO TABS: 250.0000 mg | ORAL_TABLET | Freq: Every day | ORAL | 0 refills | Status: DC

## 2018-01-17 NOTE — Telephone Encounter (Signed)
Patient is calling when she had surgery back in April for knee surgery she had to get clearance from her cardiologist patient is asking will Dr. Dahlia Byes need clearance from her cardiologist as well and if so she wanted to go ahead and make the appointment also asking about her primary care physician please call patient and advise patient can be reached at [phone].

## 2018-01-17 NOTE — Telephone Encounter (Signed)
Spoke with patient at this time. She recently had knee surgery and cardiac clearance was obtained in April. She wanted to know if we would need to see her Cardiologist again and if so she would make an appointment. I let her know that the clearance was still good, unless the provider says otherwise.  She is scheduled to have Colonoscopy 01/20/18 and follow up with Dr.Pabon.

## 2018-01-18 LAB — URINALYSIS, COMPLETE
BILIRUBIN UA: NEGATIVE
GLUCOSE, UA: NEGATIVE
KETONES UA: NEGATIVE
Nitrite, UA: NEGATIVE
Protein, UA: NEGATIVE
SPEC GRAV UA: 1.02 (ref 1.005–1.030)
Urobilinogen, Ur: 0.2 mg/dL (ref 0.2–1.0)
pH, UA: 6.5 (ref 5.0–7.5)

## 2018-01-18 LAB — MICROSCOPIC EXAMINATION

## 2018-01-18 NOTE — Discharge Instructions (Signed)
General Anesthesia, Adult, Care After °These instructions provide you with information about caring for yourself after your procedure. Your health care provider may also give you more specific instructions. Your treatment has been planned according to current medical practices, but problems sometimes occur. Call your health care provider if you have any problems or questions after your procedure. °What can I expect after the procedure? °After the procedure, it is common to have: °· Vomiting. °· A sore throat. °· Mental slowness. ° °It is common to feel: °· Nauseous. °· Cold or shivery. °· Sleepy. °· Tired. °· Sore or achy, even in parts of your body where you did not have surgery. ° °Follow these instructions at home: °For at least 24 hours after the procedure: °· Do not: °? Participate in activities where you could fall or become injured. °? Drive. °? Use heavy machinery. °? Drink alcohol. °? Take sleeping pills or medicines that cause drowsiness. °? Make important decisions or sign legal documents. °? Take care of children on your own. °· Rest. °Eating and drinking °· If you vomit, drink water, juice, or soup when you can drink without vomiting. °· Drink enough fluid to keep your urine clear or pale yellow. °· Make sure you have little or no nausea before eating solid foods. °· Follow the diet recommended by your health care provider. °General instructions °· Have a responsible adult stay with you until you are awake and alert. °· Return to your normal activities as told by your health care provider. Ask your health care provider what activities are safe for you. °· Take over-the-counter and prescription medicines only as told by your health care provider. °· If you smoke, do not smoke without supervision. °· Keep all follow-up visits as told by your health care provider. This is important. °Contact a health care provider if: °· You continue to have nausea or vomiting at home, and medicines are not helpful. °· You  cannot drink fluids or start eating again. °· You cannot urinate after 8-12 hours. °· You develop a skin rash. °· You have fever. °· You have increasing redness at the site of your procedure. °Get help right away if: °· You have difficulty breathing. °· You have chest pain. °· You have unexpected bleeding. °· You feel that you are having a life-threatening or urgent problem. °This information is not intended to replace advice given to you by your health care provider. Make sure you discuss any questions you have with your health care provider. °Document Released: 09/13/2000 Document Revised: 11/10/2015 Document Reviewed: 05/22/2015 °Elsevier Interactive Patient Education © 2018 [employer] ° °

## 2018-01-18 NOTE — Anesthesia Preprocedure Evaluation (Addendum)
Anesthesia Evaluation  Patient identified by MRN, date of birth, ID band Patient awake    Reviewed: Allergy & Precautions, NPO status , Patient's Chart, lab work & pertinent test results  History of Anesthesia Complications Negative for: history of anesthetic complications  Airway Mallampati: I  TM Distance: >3 FB Neck ROM: Full    Dental  (+) Lower Dentures, Upper Dentures   Pulmonary sleep apnea and Continuous Positive Airway Pressure Ventilation , former smoker (quit 1978),    Pulmonary exam normal breath sounds clear to auscultation       Cardiovascular Exercise Tolerance: Good hypertension, + Peripheral Vascular Disease   Rhythm:Regular Rate:Normal + Systolic murmurs Innocent murmur  ECG 09/29/17: normal  Echo 08/05/17:  NORMAL LEFT VENTRICULAR SYSTOLIC FUNCTION WITH AN ESTIMATED EF = 55 % NORMAL RIGHT VENTRICULAR SYSTOLIC FUNCTION MILD-TO-MODERATE MITRAL VALVE INSUFFICIENCY MILD TRICUSPID VALVE INSUFFICIENCY NO VALVULAR STENOSIS MILD BIATRIAL ENLARGEMENT    Neuro/Psych Vertigo     GI/Hepatic GERD  ,Colovesical fistula   Endo/Other  Hypothyroidism   Renal/GU negative Renal ROS     Musculoskeletal  (+) Arthritis , Osteoarthritis,    Abdominal   Peds  Hematology  (+) Blood dyscrasia, anemia ,   Anesthesia Other Findings Cardiology note 11/21/17:  Well managed and stable on Lisinopril and Carvedilol. Patient monitoring her BP at home and reports 130s/70s for her BP average. Her PCP following and disc Spironolactone as concern of hyponatremia.  - Continue with both ACEI and BBs. - Monitor home BP - Monitor salt intake - watch for Edema, SOB, will return to clinic or call if concerns of fluid overload -The patient is to continue diligent use CPAP machine for reduction of sleep apnea signs and symptoms, shortness of breath, hypertension, lower extremity edema  -Continue moderate to high intensive cholesterol  therapy for further future risk reduction in cardiovascular disease  -Continue current medical regimen of anti-platelet medication management for peripheral vascular disease and stroke risk reduction without change today which appears to be stable  - f/u in 1 yr on patient request as she wanted a longer time in between follow ups in the clinic. -The patient was counseled on the importance of monitoring closely for any cardiovascular symptoms, for which she will notify our office immediately.  No orders of the defined types were placed in this encounter.  Return in about 1 year (around 11/22/2018).    Reproductive/Obstetrics                            Anesthesia Physical Anesthesia Plan  ASA: III  Anesthesia Plan: General   Post-op Pain Management:    Induction: Intravenous  PONV Risk Score and Plan: 3 and TIVA and Propofol infusion  Airway Management Planned: Natural Airway  Additional Equipment:   Intra-op Plan:   Post-operative Plan:   Informed Consent: I have reviewed the patients History and Physical, chart, labs and discussed the procedure including the risks, benefits and alternatives for the proposed anesthesia with the patient or authorized representative who has indicated his/her understanding and acceptance.     Plan Discussed with: CRNA  Anesthesia Plan Comments:         Anesthesia Quick Evaluation

## 2018-01-19 DIAGNOSIS — I2510 Atherosclerotic heart disease of native coronary artery without angina pectoris: Secondary | ICD-10-CM

## 2018-01-19 DIAGNOSIS — C801 Malignant (primary) neoplasm, unspecified: Secondary | ICD-10-CM

## 2018-01-19 HISTORY — DX: Atherosclerotic heart disease of native coronary artery without angina pectoris: I25.10

## 2018-01-19 HISTORY — DX: Malignant (primary) neoplasm, unspecified: C80.1

## 2018-01-20 ENCOUNTER — Encounter
Admission: RE | Disposition: A | Discharge status: Home / Self Care | Payer: Self-pay | Source: Ambulatory Visit | Attending: Gastroenterology

## 2018-01-20 ENCOUNTER — Ambulatory Visit
Admission: RE | Admit: 2018-01-20 | Discharge: 2018-01-20 | Disposition: A | Discharge status: Home / Self Care | Payer: Medicare Other | Source: Ambulatory Visit | Attending: Gastroenterology | Admitting: Gastroenterology

## 2018-01-20 ENCOUNTER — Ambulatory Visit: Payer: Medicare Other | Admitting: Anesthesiology

## 2018-01-20 DIAGNOSIS — K219 Gastro-esophageal reflux disease without esophagitis: Secondary | ICD-10-CM | POA: Diagnosis not present

## 2018-01-20 DIAGNOSIS — K5730 Diverticulosis of large intestine without perforation or abscess without bleeding: Secondary | ICD-10-CM | POA: Insufficient documentation

## 2018-01-20 DIAGNOSIS — Z8249 Family history of ischemic heart disease and other diseases of the circulatory system: Secondary | ICD-10-CM | POA: Diagnosis not present

## 2018-01-20 DIAGNOSIS — R933 Abnormal findings on diagnostic imaging of other parts of digestive tract: Secondary | ICD-10-CM | POA: Diagnosis present

## 2018-01-20 DIAGNOSIS — G4730 Sleep apnea, unspecified: Secondary | ICD-10-CM | POA: Insufficient documentation

## 2018-01-20 DIAGNOSIS — K632 Fistula of intestine: Secondary | ICD-10-CM

## 2018-01-20 DIAGNOSIS — I739 Peripheral vascular disease, unspecified: Secondary | ICD-10-CM | POA: Insufficient documentation

## 2018-01-20 DIAGNOSIS — E039 Hypothyroidism, unspecified: Secondary | ICD-10-CM | POA: Diagnosis not present

## 2018-01-20 DIAGNOSIS — Z96651 Presence of right artificial knee joint: Secondary | ICD-10-CM | POA: Diagnosis not present

## 2018-01-20 DIAGNOSIS — I10 Essential (primary) hypertension: Secondary | ICD-10-CM | POA: Diagnosis not present

## 2018-01-20 DIAGNOSIS — Z01818 Encounter for other preprocedural examination: Secondary | ICD-10-CM

## 2018-01-20 DIAGNOSIS — Z7982 Long term (current) use of aspirin: Secondary | ICD-10-CM | POA: Diagnosis not present

## 2018-01-20 DIAGNOSIS — Z888 Allergy status to other drugs, medicaments and biological substances status: Secondary | ICD-10-CM | POA: Diagnosis not present

## 2018-01-20 DIAGNOSIS — R011 Cardiac murmur, unspecified: Secondary | ICD-10-CM | POA: Diagnosis not present

## 2018-01-20 DIAGNOSIS — E785 Hyperlipidemia, unspecified: Secondary | ICD-10-CM | POA: Diagnosis not present

## 2018-01-20 DIAGNOSIS — Z9989 Dependence on other enabling machines and devices: Secondary | ICD-10-CM | POA: Diagnosis not present

## 2018-01-20 DIAGNOSIS — Z79899 Other long term (current) drug therapy: Secondary | ICD-10-CM | POA: Insufficient documentation

## 2018-01-20 DIAGNOSIS — K640 First degree hemorrhoids: Secondary | ICD-10-CM | POA: Insufficient documentation

## 2018-01-20 DIAGNOSIS — I081 Rheumatic disorders of both mitral and tricuspid valves: Secondary | ICD-10-CM | POA: Diagnosis not present

## 2018-01-20 DIAGNOSIS — M1990 Unspecified osteoarthritis, unspecified site: Secondary | ICD-10-CM | POA: Insufficient documentation

## 2018-01-20 DIAGNOSIS — Z87891 Personal history of nicotine dependence: Secondary | ICD-10-CM | POA: Insufficient documentation

## 2018-01-20 HISTORY — PX: COLONOSCOPY WITH PROPOFOL: SHX5780

## 2018-01-20 HISTORY — DX: Presence of dental prosthetic device (complete) (partial): Z97.2

## 2018-01-20 LAB — CULTURE, URINE COMPREHENSIVE

## 2018-01-20 SURGERY — COLONOSCOPY WITH PROPOFOL
Anesthesia: General | Site: Rectum | Wound class: Contaminated

## 2018-01-20 MED ORDER — SODIUM CHLORIDE 0.9 % IV SOLN: INTRAVENOUS | Status: DC

## 2018-01-20 MED ORDER — PROPOFOL 10 MG/ML IV BOLUS
INTRAVENOUS | Status: DC | PRN
  Administered 2018-01-20 (×3): 20 mg via INTRAVENOUS
  Administered 2018-01-20: 60 mg via INTRAVENOUS
  Administered 2018-01-20 (×4): 20 mg via INTRAVENOUS

## 2018-01-20 MED ORDER — LIDOCAINE HCL (CARDIAC) PF 100 MG/5ML IV SOSY
PREFILLED_SYRINGE | INTRAVENOUS | Status: DC | PRN
  Administered 2018-01-20: 50 mg via INTRAVENOUS

## 2018-01-20 MED ORDER — ONDANSETRON HCL 4 MG/2ML IJ SOLN: 4.0000 mg | Freq: Once | INTRAMUSCULAR | Status: DC | PRN

## 2018-01-20 MED ORDER — ACETAMINOPHEN 160 MG/5ML PO SOLN: 325.0000 mg | ORAL | Status: DC | PRN

## 2018-01-20 MED ORDER — STERILE WATER FOR IRRIGATION IR SOLN
Status: DC | PRN
  Administered 2018-01-20: .5 mL

## 2018-01-20 MED ORDER — ACETAMINOPHEN 325 MG PO TABS: 650.0000 mg | ORAL_TABLET | Freq: Once | ORAL | Status: DC | PRN

## 2018-01-20 MED ORDER — LACTATED RINGERS IV SOLN
INTRAVENOUS | Status: DC
  Administered 2018-01-20: 09:00:00 via INTRAVENOUS

## 2018-01-20 SURGICAL SUPPLY — 5 items
CANISTER SUCT 1200ML W/VALVE (MISCELLANEOUS) ×2 IMPLANT
GOWN CVR UNV OPN BCK APRN NK (MISCELLANEOUS) ×2 IMPLANT
GOWN ISOL THUMB LOOP REG UNIV (MISCELLANEOUS) ×4
KIT ENDO PROCEDURE OLY (KITS) ×2 IMPLANT
WATER STERILE IRR 250ML POUR (IV SOLUTION) ×2 IMPLANT

## 2018-01-20 NOTE — Anesthesia Procedure Notes (Signed)
Performed by: Valeriano Bain, CRNA Pre-anesthesia Checklist: Patient identified, Emergency Drugs available, Suction available, Timeout performed and Patient being monitored Patient Re-evaluated:Patient Re-evaluated prior to induction Oxygen Delivery Method: Nasal cannula Placement Confirmation: positive ETCO2       

## 2018-01-20 NOTE — Transfer of Care (Signed)
Immediate Anesthesia Transfer of Care Note  Patient: Barbara Hernandez  Procedure(s) Performed: COLONOSCOPY WITH PROPOFOL (N/A Rectum)  Patient Location: PACU  Anesthesia Type: General  Level of Consciousness: awake, alert  and patient cooperative  Airway and Oxygen Therapy: Patient Spontanous Breathing and Patient connected to supplemental oxygen  Post-op Assessment: Post-op Vital signs reviewed, Patient's Cardiovascular Status Stable, Respiratory Function Stable, Patent Airway and No signs of Nausea or vomiting  Post-op Vital Signs: Reviewed and stable  Complications: No apparent anesthesia complications

## 2018-01-20 NOTE — Anesthesia Postprocedure Evaluation (Signed)
Anesthesia Post Note  Patient: Barbara Hernandez  Procedure(s) Performed: COLONOSCOPY WITH PROPOFOL (N/A Rectum)  Patient location during evaluation: PACU Anesthesia Type: General Level of consciousness: awake and alert, oriented and patient cooperative Pain management: pain level controlled Vital Signs Assessment: post-procedure vital signs reviewed and stable Respiratory status: spontaneous breathing, nonlabored ventilation and respiratory function stable Cardiovascular status: blood pressure returned to baseline and stable Postop Assessment: adequate PO intake Anesthetic complications: no    Darrin Nipper

## 2018-01-20 NOTE — Op Note (Signed)
[facility] Gastroenterology Patient Name: Barbara Hernandez Procedure Date: 01/20/2018 9:29 AM MRN: [id number] Account #: [id number] Date of Birth: 04/13/1941 Admit Type: Outpatient Age: 77 Room: [facility] OR ROOM 01 Gender: Female Note Status: Finalized Procedure:            Colonoscopy Indications:          Evaluation on imaging study of clinically significant                        abnormality, Preoperative assessment Providers:            Lucilla Lame MD, MD Referring MD:         Kerin Perna MD, MD (Referring MD) Medicines:            Propofol per Anesthesia Complications:        No immediate complications. Procedure:            Pre-Anesthesia Assessment:                       - Prior to the procedure, a History and Physical was                        performed, and patient medications and allergies were                        reviewed. The patient's tolerance of previous                        anesthesia was also reviewed. The risks and benefits of                        the procedure and the sedation options and risks were                        discussed with the patient. All questions were                        answered, and informed consent was obtained. Prior                        Anticoagulants: The patient has taken no previous                        anticoagulant or antiplatelet agents. ASA Grade                        Assessment: II - A patient with mild systemic disease.                        After reviewing the risks and benefits, the patient was                        deemed in satisfactory condition to undergo the                        procedure.                       After obtaining informed consent, the colonoscope was  passed under direct vision. Throughout the procedure,                        the patient's blood pressure, pulse, and oxygen                        saturations were monitored continuously. The                         Colonoscope was introduced through the anus and                        advanced to the the cecum, identified by appendiceal                        orifice and ileocecal valve. The colonoscopy was                        performed without difficulty. The patient tolerated the                        procedure well. The quality of the bowel preparation                        was excellent. Findings:      The perianal and digital rectal examinations were normal.      Multiple small-mouthed diverticula were found in the sigmoid colon.      Non-bleeding internal hemorrhoids were found during retroflexion. The       hemorrhoids were Grade I (internal hemorrhoids that do not prolapse).      The fistula was not seen. Impression:           - Diverticulosis in the sigmoid colon.                       - Non-bleeding internal hemorrhoids.                       - No specimens collected. Recommendation:       - Discharge patient to home.                       - Resume previous diet.                       - Continue present medications.                       - Repeat colonoscopy in 10 years for screening unless                        any change in family history or lower GI problems. Procedure Code(s):    --- Professional ---                       [phone], Colonoscopy, flexible; diagnostic, including                        collection of specimen(s) by brushing or washing, when                        performed (separate procedure) Diagnosis Code(s):    ---  Professional ---                       R93.3, Abnormal findings on diagnostic imaging of other                        parts of digestive tract                       Z01.818, Encounter for other preprocedural examination CPT copyright 2017 [employer]. All rights reserved. The codes documented in this report are preliminary and upon coder review may  be revised to meet current compliance requirements. Lucilla Lame MD,  MD 01/20/2018 9:55:04 AM This report has been signed electronically. Number of Addenda: 0 Note Initiated On: 01/20/2018 9:29 AM Scope Withdrawal Time: 0 hours 6 minutes 56 seconds  Total Procedure Duration: 0 hours 11 minutes 9 seconds       [facility]

## 2018-01-20 NOTE — H&P (Signed)
Lucilla Lame, MD [address] [place], [place] [address] [phone] Fax : [phone]  Primary Care Physician:  Hortencia Pilar, MD Primary Gastroenterologist:  Dr. Allen Norris  Pre-Procedure History & Physical: HPI:  Barbara Hernandez is a 77 y.o. female is here for an colonoscopy.   Past Medical History:  Diagnosis Date  . Anemia   . Arthritis   . Barrett's esophagus 2012   2010 upper endoscopy showed only reflux. 2012 biopsies suggested Barrett's epithelial changes.  . Bladder tumor   . BPPV (benign paroxysmal positional vertigo) 2004  . 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 Family history of adverse reaction to anesthesia    sister had memory problems after delivery of child  . GERD (gastroesophageal reflux disease)   . H/O measles   . Heart murmur   . Hematuria   . Hematuria   . Hemorrhoid   . Hemorrhoids   . History of chicken pox   . Hyperlipidemia   . Hypertension   . Hypothyroidism   . Joint pain   . Low sodium levels   . Mild left ventricular hypertrophy   . Onychomycosis   . Perforation of sigmoid colon due to diverticulitis 09/26/2017  . Primary localized osteoarthritis of left knee 09/06/2017  . Reflux   . Reflux esophagitis   . 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 Sleep apnea 2011   Uses C-Pap machine  . Thyroid disease   . Wears dentures    full upper and lower    Past Surgical History:  Procedure Laterality Date  . BLEPHAROPLASTY Bilateral 2015  . BREAST EXCISIONAL BIOPSY Left 1988   benign  . COLONOSCOPY W/ BIOPSIES N/A 01/16/2013   No source for GI blood loss, mild lymphatic prominence in the rectum.  . ESOPHAGOGASTRODUODENOSCOPY (EGD) WITH PROPOFOL N/A 03/15/2016   Procedure: ESOPHAGOGASTRODUODENOSCOPY (EGD) WITH PROPOFOL;  Surgeon: Robert Bellow, MD;  Location: [facility];  Service: Endoscopy;  Laterality: N/A;  . EYE SURGERY Bilateral 2006   cataract  . Left wrist fracture    . POPLITEAL SYNOVIAL CYST  EXCISION Right   . TOTAL KNEE ARTHROPLASTY Right 08/09/2016   Procedure: TOTAL KNEE ARTHROPLASTY;  Surgeon: Elsie Saas, MD;  Location: [place];  Service: Orthopedics;  Laterality: Right;  . TOTAL KNEE ARTHROPLASTY Left 09/19/2017   Procedure: TOTAL KNEE ARTHROPLASTY;  Surgeon: Elsie Saas, MD;  Location: [place];  Service: Orthopedics;  Laterality: Left;  . TRANSURETHRAL RESECTION OF BLADDER TUMOR N/A 11/20/2014   Procedure: TRANSURETHRAL RESECTION OF BLADDER TUMOR (TURBT);  Surgeon: Hollice Espy, MD;  Location: [facility];  Service: Urology;  Laterality: N/A;  . UPPER GI ENDOSCOPY  2012, 2014    Prior to Admission medications   Medication Sig Start Date End Date Taking? Authorizing Provider  acetaminophen (TYLENOL) 500 MG tablet Take 500 mg by mouth every 6 (six) hours as needed for mild pain.    Yes [provider]  Artificial Tear Solution (GENTEAL TEARS OP) Apply 1 application to eye at bedtime.   Yes [provider]  aspirin EC 81 MG tablet Take 81 mg by mouth daily.   Yes [provider]  Calcium Carbonate-Vit D-Min (CALCIUM 1200 PO) Take 1 tablet by mouth daily.    Yes [provider]  carboxymethylcellulose (REFRESH PLUS) 0.5 % SOLN Place 1 drop into both eyes every morning.    Yes [provider]  carvedilol (COREG) 6.25 MG tablet Take 6.25 mg by mouth 2 (two) times daily with  a meal.   Yes [provider]  celecoxib (CELEBREX) 200 MG capsule Take 200 mg by mouth 2 (two) times daily.   Yes [provider]  Cholecalciferol (VITAMIN D3) 2000 UNITS TABS Take 1 tablet by mouth every evening. bedtime   Yes [provider]  ciprofloxacin (CIPRO) 250 MG tablet Take 1 tablet (250 mg total) by mouth daily. 01/17/18  Yes Hollice Espy, MD  docusate sodium (COLACE) 100 MG capsule 1 tab 2 times a day while on narcotics.  STOOL SOFTENER 09/20/17  Yes Shepperson, Kirstin, PA-C  fluconazole (DIFLUCAN) 150 MG tablet Take 1 tablet  (150 mg total) by mouth once as needed for up to 1 dose. Take one pill orally, then Repeat in one week as needed. 12/23/17  Yes Marylene Land, NP  levothyroxine (SYNTHROID, LEVOTHROID) 125 MCG tablet Take 1 tablet (125 mcg total) by mouth daily. 09/11/15  Yes Margarita Rana, MD  lisinopril (PRINIVIL,ZESTRIL) 40 MG tablet Take 1 tablet (40 mg total) by mouth daily. bedtime 05/13/15  Yes Margarita Rana, MD  omeprazole (PRILOSEC) 20 MG capsule Take 1 capsule (20 mg total) by mouth daily. 04/15/15  Yes Margarita Rana, MD  simvastatin (ZOCOR) 20 MG tablet Take 1 tablet (20 mg total) by mouth daily. bedtime Patient taking differently: Take 20 mg by mouth daily at 6 PM. bedtime 05/13/15  Yes Margarita Rana, MD  vitamin B-12 (CYANOCOBALAMIN) 100 MCG tablet Take 100 mcg by mouth daily.   Yes [provider]  Na Sulfate-K Sulfate-Mg Sulf (SUPREP BOWEL PREP KIT) 17.5-3.13-1.6 GM/177ML SOLN Take 1 kit by mouth as directed. 01/11/18   Lucilla Lame, MD    Allergies as of 01/11/2018 - Review Complete 01/10/2018  Allergen Reaction Noted  . Carvedilol Other (See Comments) 08/30/2017  . Hctz [hydrochlorothiazide] Other (See Comments) 08/30/2017  . Spironolactone Other (See Comments) 12/01/2017    Family History  Problem Relation Age of Onset  . Heart disease Mother        CHF, CAD  . Hypertension Mother   . Mental illness Mother        Dementia  . Cancer Mother        breast, Brain Cancer  . Breast cancer Mother 53  . Cancer Father        lung  . Heart disease Father        MI  . Heart disease Sister   . Cancer Sister        Breast  . Barrett's esophagus Sister   . Hyperlipidemia Sister   . Breast cancer Sister 62  . Asthma Brother   . Heart disease Brother   . Diabetes Brother   . Anxiety disorder Brother   . Neuropathy Brother   . Neuropathy Sister   . Anxiety disorder Sister   . Mental illness Sister        Dementia  . Fibromyalgia Sister     Social History   Socioeconomic  History  . Marital status: Widowed    Spouse name: Not on file  . Number of children: 2  . Years of education: Not on file  . Highest education level: Not on file  Occupational History  . Occupation: Retired  Scientific laboratory technician  . Financial resource strain: Not on file  . Food insecurity:    Worry: Not on file    Inability: Not on file  . Transportation needs:    Medical: Not on file    Non-medical: Not on file  Tobacco Use  .  Smoking status: Former Smoker    Packs/day: 0.50    Years: 20.00    Pack years: 10.00    Types: Cigarettes    Last attempt to quit: 06/20/1977    Years since quitting: 40.6  . Smokeless tobacco: Never Used  Substance and Sexual Activity  . Alcohol use: Yes    Alcohol/week: 0.6 - 1.8 oz    Types: 1 - 2 Standard drinks or equivalent per week  . Drug use: No  . Sexual activity: Not on file  Lifestyle  . Physical activity:    Days per week: Not on file    Minutes per session: Not on file  . Stress: Not on file  Relationships  . Social connections:    Talks on phone: Not on file    Gets together: Not on file    Attends religious service: Not on file    Active member of club or organization: Not on file    Attends meetings of clubs or organizations: Not on file    Relationship status: Not on file  . Intimate partner violence:    Fear of current or ex partner: Not on file    Emotionally abused: Not on file    Physically abused: Not on file    Forced sexual activity: Not on file  Other Topics Concern  . Not on file  Social History Narrative  . Not on file    Review of Systems: See HPI, otherwise negative ROS  Physical Exam: BP (!) 177/80   Pulse 74   Temp (!) 97 F (36.1 C) (Temporal)   Resp 16   Ht '5\' 2"'  (1.575 m)   Wt 213 lb (96.6 kg)   SpO2 99%   BMI 38.96 kg/m  General:   Alert,  pleasant and cooperative in NAD Head:  Normocephalic and atraumatic. Neck:  Supple; no masses or thyromegaly. Lungs:  Clear throughout to auscultation.      Heart:  Regular rate and rhythm. Abdomen:  Soft, nontender and nondistended. Normal bowel sounds, without guarding, and without rebound.   Neurologic:  Alert and  oriented x4;  grossly normal neurologically.  Impression/Plan: Barbara Hernandez is here for an colonoscopy to be performed for pre-op for colectomy  Risks, benefits, limitations, and alternatives regarding  colonoscopy have been reviewed with the patient.  Questions have been answered.  All parties agreeable.   Lucilla Lame, MD  01/20/2018, 9:01 AM

## 2018-01-23 ENCOUNTER — Telehealth: Payer: Self-pay | Admitting: Surgery

## 2018-01-23 NOTE — Telephone Encounter (Signed)
Pt advised of pre op date/time and sx date. Sx: 02/13/18 with Dr Andria Frames assisting laparoscopic sigmoid colectomy.  Pre op: 02/06/18 @ 9:00am--office interview.   Patient is aware of the follow up appt with Dr Dahlia Byes on 01/25/18.  Patient made aware to call [phone], between 1-3:00pm the day before surgery, to find out what time to arrive.

## 2018-01-23 NOTE — Telephone Encounter (Signed)
Patient left message that she would like nurse to call her

## 2018-01-25 ENCOUNTER — Ambulatory Visit (INDEPENDENT_AMBULATORY_CARE_PROVIDER_SITE_OTHER): Payer: Medicare Other | Admitting: Surgery

## 2018-01-25 ENCOUNTER — Other Ambulatory Visit: Payer: Self-pay

## 2018-01-25 ENCOUNTER — Encounter: Payer: Self-pay | Admitting: Surgery

## 2018-01-25 VITALS — BP 132/82 | HR 72 | Temp 97.7°F | Ht 62.0 in | Wt 216.0 lb

## 2018-01-25 DIAGNOSIS — N321 Vesicointestinal fistula: Secondary | ICD-10-CM | POA: Diagnosis not present

## 2018-01-25 MED ORDER — POLYETHYLENE GLYCOL 3350 17 G PO PACK
17.0000 g | PACK | Freq: Every day | ORAL | 0 refills | Status: DC
Start: 2018-01-25 — End: 2018-03-09

## 2018-01-25 MED ORDER — NEOMYCIN SULFATE 500 MG PO TABS: 1000.0000 mg | ORAL_TABLET | Freq: Three times a day (TID) | ORAL | 0 refills | Status: DC

## 2018-01-25 MED ORDER — BISACODYL 5 MG PO TBEC: 5.0000 mg | DELAYED_RELEASE_TABLET | Freq: Once | ORAL | 0 refills | Status: AC

## 2018-01-25 MED ORDER — ERYTHROMYCIN BASE 500 MG PO TABS: 1000.0000 mg | ORAL_TABLET | Freq: Three times a day (TID) | ORAL | 0 refills | Status: DC

## 2018-01-25 NOTE — Patient Instructions (Signed)
Please see your bowel prep instruction sheet.  Please see your blue -pre-care sheet for surgery information.   Please pick up your medication at the pharmacy.

## 2018-01-27 ENCOUNTER — Encounter: Payer: Self-pay | Admitting: Surgery

## 2018-01-27 NOTE — Progress Notes (Signed)
Outpatient Surgical Follow Up  01/27/2018  Barbara Hernandez is an 77 y.o. female.   Chief Complaint  Patient presents with  . 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  She had a completion colonoscopy by Dr.Wohl, with no evidence of additional cancer or other concerning lesions.  She has been doing well continues to have pneumaturia but no abdominal pain no fevers no chills.  She is able to perform more than 4 METS of activity with out any shortness of breath or chest pain. Did have a lengthy discussion with the patient about my proposed operation and expectations as well as hospital course.    Past Medical History:  Diagnosis Date  . Anemia   . Arthritis   . Barrett's esophagus 2012   2010 upper endoscopy showed only reflux. 2012 biopsies suggested Barrett's epithelial changes.  . Bladder tumor   . BPPV (benign paroxysmal positional vertigo) 2004  . 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 Family history of adverse reaction to anesthesia    sister had memory problems after delivery of child  . GERD (gastroesophageal reflux disease)   . H/O measles   . Heart murmur   . Hematuria   . Hematuria   . Hemorrhoid   . Hemorrhoids   . History of chicken pox   . Hyperlipidemia   . Hypertension   . Hypothyroidism   . Joint pain   . Low sodium levels   . Mild left ventricular hypertrophy   . Onychomycosis   . Perforation of sigmoid colon due to diverticulitis 09/26/2017  . Primary localized osteoarthritis of left knee 09/06/2017  . Reflux   . Reflux esophagitis   . 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 Sleep apnea 2011   Uses C-Pap machine  . Thyroid disease   . Wears dentures    full upper and lower    Past Surgical History:  Procedure Laterality Date  . BLEPHAROPLASTY Bilateral 2015  . BREAST EXCISIONAL BIOPSY Left 1988   benign  . COLONOSCOPY W/ BIOPSIES N/A 01/16/2013   No source for GI blood loss, mild lymphatic prominence in  the rectum.  . COLONOSCOPY WITH PROPOFOL N/A 01/20/2018   Procedure: COLONOSCOPY WITH PROPOFOL;  Surgeon: Lucilla Lame, MD;  Location: [place];  Service: Endoscopy;  Laterality: N/A;  sleep apnea  . ESOPHAGOGASTRODUODENOSCOPY (EGD) WITH PROPOFOL N/A 03/15/2016   Procedure: ESOPHAGOGASTRODUODENOSCOPY (EGD) WITH PROPOFOL;  Surgeon: Robert Bellow, MD;  Location: [facility];  Service: Endoscopy;  Laterality: N/A;  . EYE SURGERY Bilateral 2006   cataract  . Left wrist fracture    . POPLITEAL SYNOVIAL CYST EXCISION Right   . TOTAL KNEE ARTHROPLASTY Right 08/09/2016   Procedure: TOTAL KNEE ARTHROPLASTY;  Surgeon: Elsie Saas, MD;  Location: [place];  Service: Orthopedics;  Laterality: Right;  . TOTAL KNEE ARTHROPLASTY Left 09/19/2017   Procedure: TOTAL KNEE ARTHROPLASTY;  Surgeon: Elsie Saas, MD;  Location: [place];  Service: Orthopedics;  Laterality: Left;  . TRANSURETHRAL RESECTION OF BLADDER TUMOR N/A 11/20/2014   Procedure: TRANSURETHRAL RESECTION OF BLADDER TUMOR (TURBT);  Surgeon: Hollice Espy, MD;  Location: [facility];  Service: Urology;  Laterality: N/A;  . UPPER GI ENDOSCOPY  2012, 2014    Family History  Problem Relation Age of Onset  . Heart disease Mother        CHF, CAD  . Hypertension Mother   . Mental illness Mother        Dementia  .  Cancer Mother        breast, Brain Cancer  . Breast cancer Mother 53  . Cancer Father        lung  . Heart disease Father        MI  . Heart disease Sister   . Cancer Sister        Breast  . Barrett's esophagus Sister   . Hyperlipidemia Sister   . Breast cancer Sister 61  . Asthma Brother   . Heart disease Brother   . Diabetes Brother   . Anxiety disorder Brother   . Neuropathy Brother   . Neuropathy Sister   . Anxiety disorder Sister   . Mental illness Sister        Dementia  . Fibromyalgia Sister     Social History:  reports that she quit smoking about 40 years ago. Her smoking use included cigarettes. She has  a 10.00 pack-year smoking history. She has never used smokeless tobacco. She reports that she drinks about 1.0 - 3.0 standard drinks of alcohol per week. She reports that she does not use drugs.  Allergies:  Allergies  Allergen Reactions  . Carvedilol Other (See Comments)    No energy, fatigued - higher doses  . Hctz [Hydrochlorothiazide] Other (See Comments)    Abnormal labs  . Spironolactone Other (See Comments)    Hyponatremia    Medications reviewed.    ROS Full ROS performed and is otherwise negative other than what is stated in HPI   BP 132/82   Pulse 72   Temp 97.7 F (36.5 C) (Oral)   Ht 5\' 2"  (1.575 m)   Wt 216 lb (98 kg)   BMI 39.51 kg/m   Physical Exam  Constitutional: She is oriented to person, place, and time. She appears well-developed and well-nourished. No distress.  Cardiovascular: Normal rate, regular rhythm and normal heart sounds.  Pulmonary/Chest: Effort normal and breath sounds normal. No stridor. No respiratory distress. She has no wheezes.  Abdominal: Soft. She exhibits no distension and no mass. There is no tenderness. There is no rebound and no guarding.  Musculoskeletal: She exhibits no edema or deformity.  Neurological: She is alert and oriented to person, place, and time. No cranial nerve deficit.  Skin: Skin is warm and dry. Capillary refill takes less than 2 seconds.  Psychiatric: She has a normal mood and affect. Her behavior is normal. Judgment and thought content normal.  Nursing note and vitals reviewed.    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  I do recommend elective sigmoid colectomy.  Given the fact that she is got a colovesical fistula we will do this combined case with Dr. Erlene Quan from urology.  Procedure discussed with the patient in detail.  Risk benefit and possible complications including but not limited to: Bleeding, infection, injury to adjacent structures,  need for diverting ostomy, prolonged hospitalization and chronic pain were discussed with the patient in detail.  She understands and wishes to proceed. We are going to place her on the schedule on Monday the 26 given the fact that Dr. Erlene Quan is only available that day. Greater than 50% of the 25 minutes  visit was spent in counseling/coordination of care   Caroleen Hamman, MD [facility]

## 2018-01-27 NOTE — H&P (View-Only) (Signed)
Outpatient Surgical Follow Up  01/27/2018  Barbara Hernandez is an 77 y.o. female.   Chief Complaint  Patient presents with  . 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  She had a completion colonoscopy by Dr.Wohl, with no evidence of additional cancer or other concerning lesions.  She has been doing well continues to have pneumaturia but no abdominal pain no fevers no chills.  She is able to perform more than 4 METS of activity with out any shortness of breath or chest pain. Did have a lengthy discussion with the patient about my proposed operation and expectations as well as hospital course.    Past Medical History:  Diagnosis Date  . Anemia   . Arthritis   . Barrett's esophagus 2012   2010 upper endoscopy showed only reflux. 2012 biopsies suggested Barrett's epithelial changes.  . Bladder tumor   . BPPV (benign paroxysmal positional vertigo) 2004  . 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 Family history of adverse reaction to anesthesia    sister had memory problems after delivery of child  . GERD (gastroesophageal reflux disease)   . H/O measles   . Heart murmur   . Hematuria   . Hematuria   . Hemorrhoid   . Hemorrhoids   . History of chicken pox   . Hyperlipidemia   . Hypertension   . Hypothyroidism   . Joint pain   . Low sodium levels   . Mild left ventricular hypertrophy   . Onychomycosis   . Perforation of sigmoid colon due to diverticulitis 09/26/2017  . Primary localized osteoarthritis of left knee 09/06/2017  . Reflux   . Reflux esophagitis   . 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 Sleep apnea 2011   Uses C-Pap machine  . Thyroid disease   . Wears dentures    full upper and lower    Past Surgical History:  Procedure Laterality Date  . BLEPHAROPLASTY Bilateral 2015  . BREAST EXCISIONAL BIOPSY Left 1988   benign  . COLONOSCOPY W/ BIOPSIES N/A 01/16/2013   No source for GI blood loss, mild lymphatic prominence in  the rectum.  . COLONOSCOPY WITH PROPOFOL N/A 01/20/2018   Procedure: COLONOSCOPY WITH PROPOFOL;  Surgeon: Lucilla Lame, MD;  Location: [place];  Service: Endoscopy;  Laterality: N/A;  sleep apnea  . ESOPHAGOGASTRODUODENOSCOPY (EGD) WITH PROPOFOL N/A 03/15/2016   Procedure: ESOPHAGOGASTRODUODENOSCOPY (EGD) WITH PROPOFOL;  Surgeon: Robert Bellow, MD;  Location: [facility];  Service: Endoscopy;  Laterality: N/A;  . EYE SURGERY Bilateral 2006   cataract  . Left wrist fracture    . POPLITEAL SYNOVIAL CYST EXCISION Right   . TOTAL KNEE ARTHROPLASTY Right 08/09/2016   Procedure: TOTAL KNEE ARTHROPLASTY;  Surgeon: Elsie Saas, MD;  Location: [place];  Service: Orthopedics;  Laterality: Right;  . TOTAL KNEE ARTHROPLASTY Left 09/19/2017   Procedure: TOTAL KNEE ARTHROPLASTY;  Surgeon: Elsie Saas, MD;  Location: [place];  Service: Orthopedics;  Laterality: Left;  . TRANSURETHRAL RESECTION OF BLADDER TUMOR N/A 11/20/2014   Procedure: TRANSURETHRAL RESECTION OF BLADDER TUMOR (TURBT);  Surgeon: Hollice Espy, MD;  Location: [facility];  Service: Urology;  Laterality: N/A;  . UPPER GI ENDOSCOPY  2012, 2014    Family History  Problem Relation Age of Onset  . Heart disease Mother        CHF, CAD  . Hypertension Mother   . Mental illness Mother        Dementia  .  Cancer Mother        breast, Brain Cancer  . Breast cancer Mother 84  . Cancer Father        lung  . Heart disease Father        MI  . Heart disease Sister   . Cancer Sister        Breast  . Barrett's esophagus Sister   . Hyperlipidemia Sister   . Breast cancer Sister 64  . Asthma Brother   . Heart disease Brother   . Diabetes Brother   . Anxiety disorder Brother   . Neuropathy Brother   . Neuropathy Sister   . Anxiety disorder Sister   . Mental illness Sister        Dementia  . Fibromyalgia Sister     Social History:  reports that she quit smoking about 40 years ago. Her smoking use included cigarettes. She has  a 10.00 pack-year smoking history. She has never used smokeless tobacco. She reports that she drinks about 1.0 - 3.0 standard drinks of alcohol per week. She reports that she does not use drugs.  Allergies:  Allergies  Allergen Reactions  . Carvedilol Other (See Comments)    No energy, fatigued - higher doses  . Hctz [Hydrochlorothiazide] Other (See Comments)    Abnormal labs  . Spironolactone Other (See Comments)    Hyponatremia    Medications reviewed.    ROS Full ROS performed and is otherwise negative other than what is stated in HPI   BP 132/82   Pulse 72   Temp 97.7 F (36.5 C) (Oral)   Ht 5\' 2"  (1.575 m)   Wt 216 lb (98 kg)   BMI 39.51 kg/m   Physical Exam  Constitutional: She is oriented to person, place, and time. She appears well-developed and well-nourished. No distress.  Cardiovascular: Normal rate, regular rhythm and normal heart sounds.  Pulmonary/Chest: Effort normal and breath sounds normal. No stridor. No respiratory distress. She has no wheezes.  Abdominal: Soft. She exhibits no distension and no mass. There is no tenderness. There is no rebound and no guarding.  Musculoskeletal: She exhibits no edema or deformity.  Neurological: She is alert and oriented to person, place, and time. No cranial nerve deficit.  Skin: Skin is warm and dry. Capillary refill takes less than 2 seconds.  Psychiatric: She has a normal mood and affect. Her behavior is normal. Judgment and thought content normal.  Nursing note and vitals reviewed.    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  I do recommend elective sigmoid colectomy.  Given the fact that she is got a colovesical fistula we will do this combined case with Dr. Erlene Quan from urology.  Procedure discussed with the patient in detail.  Risk benefit and possible complications including but not limited to: Bleeding, infection, injury to adjacent structures,  need for diverting ostomy, prolonged hospitalization and chronic pain were discussed with the patient in detail.  She understands and wishes to proceed. We are going to place her on the schedule on Monday the 26 given the fact that Dr. Erlene Quan is only available that day. Greater than 50% of the 25 minutes  visit was spent in counseling/coordination of care   Caroleen Hamman, MD [place] [facility]

## 2018-02-06 ENCOUNTER — Encounter
Admission: RE | Admit: 2018-02-06 | Discharge: 2018-02-06 | Disposition: A | Discharge status: Home / Self Care | Payer: Medicare Other | Source: Ambulatory Visit | Attending: Surgery | Admitting: Surgery

## 2018-02-06 ENCOUNTER — Other Ambulatory Visit: Payer: Self-pay

## 2018-02-06 DIAGNOSIS — N321 Vesicointestinal fistula: Secondary | ICD-10-CM | POA: Diagnosis not present

## 2018-02-06 DIAGNOSIS — Z01818 Encounter for other preprocedural examination: Secondary | ICD-10-CM | POA: Diagnosis present

## 2018-02-06 HISTORY — DX: Atherosclerotic heart disease of native coronary artery without angina pectoris: I25.10

## 2018-02-06 HISTORY — DX: Malignant (primary) neoplasm, unspecified: C80.1

## 2018-02-06 LAB — BASIC METABOLIC PANEL
ANION GAP: 7 (ref 5–15)
BUN: 13 mg/dL (ref 8–23)
CHLORIDE: 101 mmol/L (ref 98–111)
CO2: 29 mmol/L (ref 22–32)
Calcium: 9.2 mg/dL (ref 8.9–10.3)
Creatinine, Ser: 0.52 mg/dL (ref 0.44–1.00)
GFR calc non Af Amer: >60 mL/min (ref >60)
Glucose, Bld: 83 mg/dL (ref 70–99)
Potassium: 4.9 mmol/L (ref 3.5–5.1)
Sodium: 137 mmol/L (ref 135–145)

## 2018-02-06 LAB — CBC
HEMATOCRIT: 36.4 % (ref 35.0–47.0)
HEMOGLOBIN: 12.1 g/dL (ref 12.0–16.0)
MCH: 29.2 pg (ref 26.0–34.0)
MCHC: 33.3 g/dL (ref 32.0–36.0)
MCV: 87.6 fL (ref 80.0–100.0)
Platelets: 245 10*3/uL (ref 150–440)
RBC: 4.16 MIL/uL (ref 3.80–5.20)
RDW: 14.6 % — ABNORMAL HIGH (ref 11.5–14.5)
WBC: 5.2 10*3/uL (ref 3.6–11.0)

## 2018-02-06 NOTE — Patient Instructions (Signed)
Your procedure is scheduled on: Monday, February 10, 2018  Report to [place].   DO NOT STOP ON THE FIRST FLOOR TO REGISTER  To find out your arrival time please call [phone] between 1PM - 3PM on Friday, February 10, 2018  Remember: Instructions that are not followed completely may result in serious medical risk,  up to and including death, or upon the discretion of your surgeon and anesthesiologist your  surgery may need to be rescheduled.     _X__ 1. Do not eat food after midnight the night before your procedure.                 No gum chewing or hard candies. ABSOLUTELY NOTHING SOLID IN YOUR MOUTH AFTER MIDNIGHT                  You may drink clear liquids up to 2 hours before you are scheduled to arrive for your surgery-                   DO not drink clear liquids within 2 hours of the start of your surgery.                  Clear Liquids include:  water, apple juice without pulp, clear carbohydrate                 drink such as Clearfast of Gatorade, Black Coffee or Tea (Do not add                 anything to coffee or tea).  __X__2.  On the morning of surgery brush your teeth with toothpaste and water,                   You may rinse your mouth with mouthwash if you wish.                        Do not swallow any toothpaste of mouthwash.     _X__ 3.  No Alcohol for 24 hours before or after surgery.   _X__ 4.  Do Not Smoke or use e-cigarettes For 24 Hours Prior to Your Surgery.                 Do not use any chewable tobacco products for at least 6 hours prior to                 surgery.  ____  5.  Bring all medications with you on the day of surgery if instructed.   ____  6.  Notify your doctor if there is any change in your medical condition      (cold, fever, infections).     Do not wear jewelry, make-up, hairpins, clips or nail polish. Do not wear lotions, powders, or perfumes. You may wear deodorant. Do not shave 48  hours prior to surgery. Men may shave face and neck. Do not bring valuables to the hospital.    [facility] is not responsible for any belongings or valuables.  Contacts, dentures or bridgework may not be worn into surgery. Leave your suitcase in the car. After surgery it may be brought to your room. For patients admitted to the hospital, discharge time is determined by your treatment team.   Patients discharged the day of surgery will not be allowed to drive home.   Please read over the following fact sheets that  you were given:   PREPARING FOR SURGERY   ____ Take these medicines the morning of surgery with A SIP OF WATER:    1.CARVEDILOL (COREG)  2. PRILOSEC  3. SYNTHROID  4.   5.  6.  _X___ Fleet Enema (as directed) COMPLETE BOWEL PREP AS PER DR. PABON  _X___ Use CHG Soap as directed  ____ Use inhalers on the day of surgery  X____ Stop ASPIRIN AS OF TODAY  __X__ Stop Anti-inflammatories AS OF TODAY              THIS INCLUDES CELEBREX   _X___ Stop supplements until after surgery.                YOU MAY CONTINUE THE VITAMIN D3 AND B12 BUT DO NOT TAKE THESE ON THE DAY OF SURGERY  __X__ Bring C-Pap to the hospital. BRING IT IN WITH YOU ON THE MORNING OF SURGERY  YOU MAY CONTINUE TO USE THE EYE DROPS AND CONTINUE TAKING LISINOPRIL BUT NOT ON THE MORNING OF SURGERY.  STOP TAKING TUMS AS OF TODAY.  TAKE THE ZOCOR AND CIPRO IN THE EVENING AS USUAL.  TYLENOL IS OKAY TO TAKE AT ANY TIME.  WEAR LOOSE FITTING CLOTHES AND HAVE STURDY SHOES FOR YOUR OVERNIGHT STAY!!

## 2018-02-07 ENCOUNTER — Telehealth: Payer: Self-pay | Admitting: *Deleted

## 2018-02-07 ENCOUNTER — Other Ambulatory Visit: Payer: Self-pay | Admitting: Family Medicine

## 2018-02-07 DIAGNOSIS — Z780 Asymptomatic menopausal state: Secondary | ICD-10-CM

## 2018-02-07 NOTE — Telephone Encounter (Signed)
Patient is calling and has some questions regarding her FMLA paperwork, she is wanting a status on when the paperwork will be done. Please advise

## 2018-02-07 NOTE — Telephone Encounter (Signed)
FMLA paperwork questions answered.  Assured patient FMLA paperwork would be completed 02/08/18.

## 2018-02-09 NOTE — H&P (View-Only) (Signed)
01/17/2018 5:14 PM   Barbara Hernandez [birth year] [id number]  Referring provider: Hortencia Pilar, [place], [place] [address]  Chief Complaint  Patient presents with  . Cysto    HPI: 77 year old female with history of pneumaturia and recurrent urinary tract infections recently found to have evidence of a significant colovesical fistula on CT abdomen pelvis with contrast on 01/06/2018 as ordered by gastroenterology, Dr. Allen Norris.    In addition to the above, she does have what appears to be an abscess/fluid-filled collection measuring 2.4 x 4.6 x 3.8 adjacent and possibly involving the bladder.  She has been seen and evaluated by Dr. Allen Norris and will be undergoing a colonoscopy to rule out underlying malignancy later this week.  She was also referred to Dr. Dahlia Byes of general surgery for sigmoid colectomy and repair of the fistula.  She was started on Cipro/Flagyl by myself on 01/09/2018 with improvement in her symptoms.  She continues to have mild irritative voiding symptoms and pneumaturia.  Her symptoms are improving with antibiotics.  No fevers or chills.  No significant abdominal pain.   PMH: Past Medical History:  Diagnosis Date  . Anemia   . Arthritis   . Barrett's esophagus 2012   2010 upper endoscopy showed only reflux. 2012 biopsies suggested Barrett's epithelial changes.  . Bladder tumor   . BPPV (benign paroxysmal positional vertigo) 2004  . Cancer ([place]) 01/2018   bladder with low potential malignancy. tumor removed and did not require further treatment  . 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 Coronary artery disease 01/2018   sees Dr. Nehemiah Massed  . Family history of adverse reaction to anesthesia    sister had memory problems after delivery of child  . GERD (gastroesophageal reflux disease)   . H/O measles   . Heart murmur   . Hematuria   . Hematuria   . Hemorrhoid   . Hemorrhoids   . History of chicken pox   . Hyperlipidemia   . Hypertension   .  Hypothyroidism   . Joint pain   . Low sodium levels   . Mild left ventricular hypertrophy   . Onychomycosis   . Perforation of sigmoid colon due to diverticulitis 09/26/2017  . Primary localized osteoarthritis of left knee 09/06/2017  . Reflux   . Reflux esophagitis   . 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 Sleep apnea 2011   Uses C-Pap machine  . Thyroid disease   . Wears dentures    full upper and lower    Surgical History: Past Surgical History:  Procedure Laterality Date  . BLEPHAROPLASTY Bilateral 2015  . BREAST EXCISIONAL BIOPSY Left 1988   benign  . COLONOSCOPY W/ BIOPSIES N/A 01/16/2013   No source for GI blood loss, mild lymphatic prominence in the rectum.  . COLONOSCOPY WITH PROPOFOL N/A 01/20/2018   Procedure: COLONOSCOPY WITH PROPOFOL;  Surgeon: Lucilla Lame, MD;  Location: [facility];  Service: Endoscopy;  Laterality: N/A;  sleep apnea  . ESOPHAGOGASTRODUODENOSCOPY (EGD) WITH PROPOFOL N/A 03/15/2016   Procedure: ESOPHAGOGASTRODUODENOSCOPY (EGD) WITH PROPOFOL;  Surgeon: Robert Bellow, MD;  Location: [facility];  Service: Endoscopy;  Laterality: N/A;  . EYE SURGERY Bilateral 2006   cataract  . Left wrist fracture Left 1980   pins removed  . POPLITEAL SYNOVIAL CYST EXCISION Right   . TOTAL KNEE ARTHROPLASTY Right 08/09/2016   Procedure: TOTAL KNEE ARTHROPLASTY;  Surgeon: Elsie Saas, MD;  Location: [facility];  Service: Orthopedics;  Laterality: Right;  . TOTAL KNEE ARTHROPLASTY Left 09/19/2017   Procedure: TOTAL KNEE ARTHROPLASTY;  Surgeon: Elsie Saas, MD;  Location: [facility];  Service: Orthopedics;  Laterality: Left;  . TRANSURETHRAL RESECTION OF BLADDER TUMOR N/A 11/20/2014   Procedure: TRANSURETHRAL RESECTION OF BLADDER TUMOR (TURBT);  Surgeon: Hollice Espy, MD;  Location: [facility];  Service: Urology;  Laterality: N/A;  . UPPER GI ENDOSCOPY  2012, 2014    Home Medications:  Allergies as of 01/17/2018       Reactions   Carvedilol Other (See Comments)   No energy, fatigued - higher doses   Hctz [hydrochlorothiazide] Other (See Comments)   Abnormal labs   Spironolactone Other (See Comments)   Hyponatremia      Medication List        Accurate as of 01/17/18 11:59 PM. Always use your most recent med list.          acetaminophen 500 MG tablet Commonly known as:  TYLENOL Take 500 mg by mouth 2 (two) times daily as needed for mild pain.   aspirin EC 81 MG tablet Take 81 mg by mouth at bedtime.   CALCIUM 1200 PO Take 1 tablet by mouth daily.   carboxymethylcellulose 0.5 % Soln Commonly known as:  REFRESH PLUS Place 1 drop into both eyes at bedtime.   carvedilol 6.25 MG tablet Commonly known as:  COREG Take 6.25 mg by mouth 2 (two) times daily with a meal.   celecoxib 200 MG capsule Commonly known as:  CELEBREX Take 200 mg by mouth daily.   ciprofloxacin 250 MG tablet Commonly known as:  CIPRO Take 1 tablet (250 mg total) by mouth daily.   docusate sodium 100 MG capsule Commonly known as:  COLACE 1 tab 2 times a day while on narcotics.  STOOL SOFTENER   fluconazole 150 MG tablet Commonly known as:  DIFLUCAN Take 1 tablet (150 mg total) by mouth once as needed for up to 1 dose. Take one pill orally, then Repeat in one week as needed.   GENTEAL TEARS OP Apply 1 application to eye at bedtime.   levothyroxine 125 MCG tablet Commonly known as:  SYNTHROID, LEVOTHROID Take 1 tablet (125 mcg total) by mouth daily.   lisinopril 40 MG tablet Commonly known as:  PRINIVIL,ZESTRIL Take 1 tablet (40 mg total) by mouth daily. bedtime   Na Sulfate-K Sulfate-Mg Sulf 17.5-3.13-1.6 GM/177ML Soln Take 1 kit by mouth as directed.   omeprazole 20 MG capsule Commonly known as:  PRILOSEC Take 1 capsule (20 mg total) by mouth daily.   simvastatin 20 MG tablet Commonly known as:  ZOCOR Take 1 tablet (20 mg total) by mouth daily. bedtime   vitamin B-12 100 MCG tablet Commonly known  as:  CYANOCOBALAMIN Take 100 mcg by mouth daily.   Vitamin D3 2000 units Tabs Take 2,000 Units by mouth every evening.       Allergies:  Allergies  Allergen Reactions  . Carvedilol Other (See Comments)    No energy, fatigued - higher doses  . Hctz [Hydrochlorothiazide] Other (See Comments)    Abnormal labs hyponatremia  . Spironolactone Other (See Comments)    Hyponatremia    Family History: Family History  Problem Relation Age of Onset  . Heart disease Mother        CHF, CAD  . Hypertension Mother   . Mental illness Mother        Dementia  . Cancer Mother  breast, Brain Cancer  . Breast cancer Mother 24  . Cancer Father        lung  . Heart disease Father        MI  . Heart disease Sister   . Cancer Sister        Breast  . Barrett's esophagus Sister   . Hyperlipidemia Sister   . Breast cancer Sister 36  . Asthma Brother   . Heart disease Brother   . Diabetes Brother   . Anxiety disorder Brother   . Neuropathy Brother   . Neuropathy Sister   . Anxiety disorder Sister   . Mental illness Sister        Dementia  . Fibromyalgia Sister     Social History:  reports that she quit smoking about 40 years ago. Her smoking use included cigarettes. She has a 10.00 pack-year smoking history. She has never used smokeless tobacco. She reports that she drinks about 1.0 - 3.0 standard drinks of alcohol per week. She reports that she does not use drugs.  Physical Exam: BP (!) 164/45   Pulse 73   Ht _0  (1.575 m)   Wt 217 lb (98.4 kg)   BMI 39.69 kg/m   Constitutional:  Alert and oriented, No acute distress. HEENT: [place] AT, moist mucus membranes.  Trachea midline, no masses. Cardiovascular: No clubbing, cyanosis, or edema. Respiratory: Normal respiratory effort, no increased work of breathing. GI: Abdomen is soft, nontender, nondistended, no abdominal masses GU: No CVA tenderness Skin: No rashes, bruises or suspicious lesions. Neurologic: Grossly intact, no  focal deficits, moving all 4 extremities. Psychiatric: Normal mood and affect.  Laboratory Data: Lab Results  Component Value Date   WBC 5.2 02/06/2018   HGB 12.1 02/06/2018   HCT 36.4 02/06/2018   MCV 87.6 02/06/2018   PLT 245 02/06/2018    Lab Results  Component Value Date   CREATININE 0.52 02/06/2018   Urinalysis    Component Value Date/Time   COLORURINE STRAW (A) 12/23/2017 1922   APPEARANCEUR Cloudy (A) 01/17/2018 1059   LABSPEC 1.015 12/23/2017 1922   LABSPEC 1.015 06/17/2014 1204   PHURINE 7.0 12/23/2017 1922   GLUCOSEU Negative 01/17/2018 1059   GLUCOSEU NEGATIVE 06/17/2014 1204   HGBUR MODERATE (A) 12/23/2017 1922   BILIRUBINUR Negative 01/17/2018 1059   BILIRUBINUR NEGATIVE 06/17/2014 1204   [place] 12/23/2017 1922   PROTEINUR Negative 01/17/2018 1059   PROTEINUR NEGATIVE 12/23/2017 1922   NITRITE Negative 01/17/2018 1059   NITRITE NEGATIVE 12/23/2017 1922   LEUKOCYTESUR 2+ (A) 01/17/2018 1059   LEUKOCYTESUR NEGATIVE 06/17/2014 1204    Lab Results  Component Value Date   LABMICR See below: 01/17/2018   WBCUA 11-30 (A) 01/17/2018   RBCUA 0-2 01/17/2018   LABEPIT 0-10 01/17/2018   MUCUS Present (A) 01/17/2018   BACTERIA Many (A) 01/17/2018    Pertinent Imaging: CLINICAL DATA:  Gross hematuria, pneumaturia, history of diverticulitis. Evaluate for colovesical fistula.  EXAM: CT ABDOMEN AND PELVIS WITH CONTRAST  TECHNIQUE: Multidetector CT imaging of the abdomen and pelvis was performed using the standard protocol following bolus administration of intravenous contrast.  CONTRAST:  199m OMNIPAQUE IOHEXOL 300 MG/ML  SOLN  COMPARISON:  09/23/2017  FINDINGS: Lower chest: Lung bases are clear.  Hepatobiliary: Liver is within normal limits.  Gallbladder is unremarkable. No intrahepatic or extrahepatic duct dilatation.  Pancreas: Within normal limits.  Spleen: Within normal limits.  Adrenals/Urinary Tract: Adrenal glands  are within normal limits.  Kidneys are within normal  limits. Partially duplicated right renal collecting system. No hydronephrosis.  Nondependent gas in the anterior bladder. Wall thickening along the bladder dome with associated 2.4 x 4.6 x 3.8 cm fluid in glass collection (series 6/image 64), reflecting an intramural bladder abscess, likely with fistulous communication between the sigmoid colon and bladder (coronal image 47).  Stomach/Bowel: Stomach is within normal limits.  No evidence of bowel obstruction.  Normal appendix (series 2/image 53).  Sigmoid diverticulosis, without evidence of diverticulitis. Mild mucosal hypertrophy directly above the region of fistulous communication (series 2/image 70).  Vascular/Lymphatic: No evidence of abdominal aortic aneurysm.  Atherosclerotic calcifications of the abdominal aorta and branch vessels.  No suspicious abdominopelvic lymphadenopathy.  Reproductive: Uterus is within normal limits.  Bilateral ovaries are within normal limits.  Other: No abdominopelvic ascites.  No pneumoperitoneum or free air.  Musculoskeletal: Degenerative changes of the visualized thoracolumbar spine.  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  Sigmoid diverticulosis, without evidence of diverticulitis.  No free air.   Electronically Signed   By: Julian Hy M.D.   On: 01/06/2018 13:41  CT scan personally reviewed today and with the patient.  Agree with radiologic interpretation.  Is unclear whether or not the intramural bladder abscess is truly an abscess versus adjacent fluid at the site of the fistulous tract.  It does not appear to be amenable to drainage.  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 Dahlia Byes, agree that she will need sigmoid colectomy with excision of her fistulous tract/abscess on bladder closure. Case was also discussed with Dr. Allen Norris, agree with plan for colonoscopy prior to any surgical intervention to rule out underlying malignancy She has a personal history of bladder cancer although recent cystoscopy was negative for any recurrence, unlikely to be related to bladder malignancy, see below We did go ahead and discussed risk and benefits of surgery today, need for Foley catheter for at least a week postoperatively with cystogram prior to rule out, amongst others.  All questions were answered. - Urinalysis, Complete - CULTURE, URINE COMPREHENSIVE  2. 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

## 2018-02-09 NOTE — Progress Notes (Signed)
01/17/2018 5:14 PM   Barbara Hernandez [birth year] [id number]  Referring provider: Hortencia Pilar, [place], [place] [address]  Chief Complaint  Patient presents with  . Cysto    HPI: 77 year old female with history of pneumaturia and recurrent urinary tract infections recently found to have evidence of a significant colovesical fistula on CT abdomen pelvis with contrast on 01/06/2018 as ordered by gastroenterology, Dr. Allen Norris.    In addition to the above, she does have what appears to be an abscess/fluid-filled collection measuring 2.4 x 4.6 x 3.8 adjacent and possibly involving the bladder.  She has been seen and evaluated by Dr. Allen Norris and will be undergoing a colonoscopy to rule out underlying malignancy later this week.  She was also referred to Dr. Dahlia Byes of general surgery for sigmoid colectomy and repair of the fistula.  She was started on Cipro/Flagyl by myself on 01/09/2018 with improvement in her symptoms.  She continues to have mild irritative voiding symptoms and pneumaturia.  Her symptoms are improving with antibiotics.  No fevers or chills.  No significant abdominal pain.   PMH: Past Medical History:  Diagnosis Date  . Anemia   . Arthritis   . Barrett's esophagus 2012   2010 upper endoscopy showed only reflux. 2012 biopsies suggested Barrett's epithelial changes.  . Bladder tumor   . BPPV (benign paroxysmal positional vertigo) 2004  . Cancer ([facility]) 01/2018   bladder with low potential malignancy. tumor removed and did not require further treatment  . 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 Coronary artery disease 01/2018   sees Dr. Nehemiah Massed  . Family history of adverse reaction to anesthesia    sister had memory problems after delivery of child  . GERD (gastroesophageal reflux disease)   . H/O measles   . Heart murmur   . Hematuria   . Hematuria   . Hemorrhoid   . Hemorrhoids   . History of chicken pox   . Hyperlipidemia   . Hypertension   .  Hypothyroidism   . Joint pain   . Low sodium levels   . Mild left ventricular hypertrophy   . Onychomycosis   . Perforation of sigmoid colon due to diverticulitis 09/26/2017  . Primary localized osteoarthritis of left knee 09/06/2017  . Reflux   . Reflux esophagitis   . 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 Sleep apnea 2011   Uses C-Pap machine  . Thyroid disease   . Wears dentures    full upper and lower    Surgical History: Past Surgical History:  Procedure Laterality Date  . BLEPHAROPLASTY Bilateral 2015  . BREAST EXCISIONAL BIOPSY Left 1988   benign  . COLONOSCOPY W/ BIOPSIES N/A 01/16/2013   No source for GI blood loss, mild lymphatic prominence in the rectum.  . COLONOSCOPY WITH PROPOFOL N/A 01/20/2018   Procedure: COLONOSCOPY WITH PROPOFOL;  Surgeon: Lucilla Lame, MD;  Location: [facility];  Service: Endoscopy;  Laterality: N/A;  sleep apnea  . ESOPHAGOGASTRODUODENOSCOPY (EGD) WITH PROPOFOL N/A 03/15/2016   Procedure: ESOPHAGOGASTRODUODENOSCOPY (EGD) WITH PROPOFOL;  Surgeon: Robert Bellow, MD;  Location: [facility];  Service: Endoscopy;  Laterality: N/A;  . EYE SURGERY Bilateral 2006   cataract  . Left wrist fracture Left 1980   pins removed  . POPLITEAL SYNOVIAL CYST EXCISION Right   . TOTAL KNEE ARTHROPLASTY Right 08/09/2016   Procedure: TOTAL KNEE ARTHROPLASTY;  Surgeon: Elsie Saas, MD;  Location: [facility];  Service: Orthopedics;  Laterality: Right;  . TOTAL KNEE ARTHROPLASTY Left 09/19/2017   Procedure: TOTAL KNEE ARTHROPLASTY;  Surgeon: Elsie Saas, MD;  Location: [facility];  Service: Orthopedics;  Laterality: Left;  . TRANSURETHRAL RESECTION OF BLADDER TUMOR N/A 11/20/2014   Procedure: TRANSURETHRAL RESECTION OF BLADDER TUMOR (TURBT);  Surgeon: Hollice Espy, MD;  Location: [facility];  Service: Urology;  Laterality: N/A;  . UPPER GI ENDOSCOPY  2012, 2014    Home Medications:  Allergies as of 01/17/2018       Reactions   Carvedilol Other (See Comments)   No energy, fatigued - higher doses   Hctz [hydrochlorothiazide] Other (See Comments)   Abnormal labs   Spironolactone Other (See Comments)   Hyponatremia      Medication List        Accurate as of 01/17/18 11:59 PM. Always use your most recent med list.          acetaminophen 500 MG tablet Commonly known as:  TYLENOL Take 500 mg by mouth 2 (two) times daily as needed for mild pain.   aspirin EC 81 MG tablet Take 81 mg by mouth at bedtime.   CALCIUM 1200 PO Take 1 tablet by mouth daily.   carboxymethylcellulose 0.5 % Soln Commonly known as:  REFRESH PLUS Place 1 drop into both eyes at bedtime.   carvedilol 6.25 MG tablet Commonly known as:  COREG Take 6.25 mg by mouth 2 (two) times daily with a meal.   celecoxib 200 MG capsule Commonly known as:  CELEBREX Take 200 mg by mouth daily.   ciprofloxacin 250 MG tablet Commonly known as:  CIPRO Take 1 tablet (250 mg total) by mouth daily.   docusate sodium 100 MG capsule Commonly known as:  COLACE 1 tab 2 times a day while on narcotics.  STOOL SOFTENER   fluconazole 150 MG tablet Commonly known as:  DIFLUCAN Take 1 tablet (150 mg total) by mouth once as needed for up to 1 dose. Take one pill orally, then Repeat in one week as needed.   GENTEAL TEARS OP Apply 1 application to eye at bedtime.   levothyroxine 125 MCG tablet Commonly known as:  SYNTHROID, LEVOTHROID Take 1 tablet (125 mcg total) by mouth daily.   lisinopril 40 MG tablet Commonly known as:  PRINIVIL,ZESTRIL Take 1 tablet (40 mg total) by mouth daily. bedtime   Na Sulfate-K Sulfate-Mg Sulf 17.5-3.13-1.6 GM/177ML Soln Take 1 kit by mouth as directed.   omeprazole 20 MG capsule Commonly known as:  PRILOSEC Take 1 capsule (20 mg total) by mouth daily.   simvastatin 20 MG tablet Commonly known as:  ZOCOR Take 1 tablet (20 mg total) by mouth daily. bedtime   vitamin B-12 100 MCG tablet Commonly known  as:  CYANOCOBALAMIN Take 100 mcg by mouth daily.   Vitamin D3 2000 units Tabs Take 2,000 Units by mouth every evening.       Allergies:  Allergies  Allergen Reactions  . Carvedilol Other (See Comments)    No energy, fatigued - higher doses  . Hctz [Hydrochlorothiazide] Other (See Comments)    Abnormal labs hyponatremia  . Spironolactone Other (See Comments)    Hyponatremia    Family History: Family History  Problem Relation Age of Onset  . Heart disease Mother        CHF, CAD  . Hypertension Mother   . Mental illness Mother        Dementia  . Cancer Mother  breast, Brain Cancer  . Breast cancer Mother 24  . Cancer Father        lung  . Heart disease Father        MI  . Heart disease Sister   . Cancer Sister        Breast  . Barrett's esophagus Sister   . Hyperlipidemia Sister   . Breast cancer Sister 36  . Asthma Brother   . Heart disease Brother   . Diabetes Brother   . Anxiety disorder Brother   . Neuropathy Brother   . Neuropathy Sister   . Anxiety disorder Sister   . Mental illness Sister        Dementia  . Fibromyalgia Sister     Social History:  reports that she quit smoking about 40 years ago. Her smoking use included cigarettes. She has a 10.00 pack-year smoking history. She has never used smokeless tobacco. She reports that she drinks about 1.0 - 3.0 standard drinks of alcohol per week. She reports that she does not use drugs.  Physical Exam: BP (!) 164/45   Pulse 73   Ht _0  (1.575 m)   Wt 217 lb (98.4 kg)   BMI 39.69 kg/m   Constitutional:  Alert and oriented, No acute distress. HEENT: [place] AT, moist mucus membranes.  Trachea midline, no masses. Cardiovascular: No clubbing, cyanosis, or edema. Respiratory: Normal respiratory effort, no increased work of breathing. GI: Abdomen is soft, nontender, nondistended, no abdominal masses GU: No CVA tenderness Skin: No rashes, bruises or suspicious lesions. Neurologic: Grossly intact, no  focal deficits, moving all 4 extremities. Psychiatric: Normal mood and affect.  Laboratory Data: Lab Results  Component Value Date   WBC 5.2 02/06/2018   HGB 12.1 02/06/2018   HCT 36.4 02/06/2018   MCV 87.6 02/06/2018   PLT 245 02/06/2018    Lab Results  Component Value Date   CREATININE 0.52 02/06/2018   Urinalysis    Component Value Date/Time   COLORURINE STRAW (A) 12/23/2017 1922   APPEARANCEUR Cloudy (A) 01/17/2018 1059   LABSPEC 1.015 12/23/2017 1922   LABSPEC 1.015 06/17/2014 1204   PHURINE 7.0 12/23/2017 1922   GLUCOSEU Negative 01/17/2018 1059   GLUCOSEU NEGATIVE 06/17/2014 1204   HGBUR MODERATE (A) 12/23/2017 1922   BILIRUBINUR Negative 01/17/2018 1059   BILIRUBINUR NEGATIVE 06/17/2014 1204   [place] 12/23/2017 1922   PROTEINUR Negative 01/17/2018 1059   PROTEINUR NEGATIVE 12/23/2017 1922   NITRITE Negative 01/17/2018 1059   NITRITE NEGATIVE 12/23/2017 1922   LEUKOCYTESUR 2+ (A) 01/17/2018 1059   LEUKOCYTESUR NEGATIVE 06/17/2014 1204    Lab Results  Component Value Date   LABMICR See below: 01/17/2018   WBCUA 11-30 (A) 01/17/2018   RBCUA 0-2 01/17/2018   LABEPIT 0-10 01/17/2018   MUCUS Present (A) 01/17/2018   BACTERIA Many (A) 01/17/2018    Pertinent Imaging: CLINICAL DATA:  Gross hematuria, pneumaturia, history of diverticulitis. Evaluate for colovesical fistula.  EXAM: CT ABDOMEN AND PELVIS WITH CONTRAST  TECHNIQUE: Multidetector CT imaging of the abdomen and pelvis was performed using the standard protocol following bolus administration of intravenous contrast.  CONTRAST:  199m OMNIPAQUE IOHEXOL 300 MG/ML  SOLN  COMPARISON:  09/23/2017  FINDINGS: Lower chest: Lung bases are clear.  Hepatobiliary: Liver is within normal limits.  Gallbladder is unremarkable. No intrahepatic or extrahepatic duct dilatation.  Pancreas: Within normal limits.  Spleen: Within normal limits.  Adrenals/Urinary Tract: Adrenal glands  are within normal limits.  Kidneys are within normal  limits. Partially duplicated right renal collecting system. No hydronephrosis.  Nondependent gas in the anterior bladder. Wall thickening along the bladder dome with associated 2.4 x 4.6 x 3.8 cm fluid in glass collection (series 6/image 64), reflecting an intramural bladder abscess, likely with fistulous communication between the sigmoid colon and bladder (coronal image 47).  Stomach/Bowel: Stomach is within normal limits.  No evidence of bowel obstruction.  Normal appendix (series 2/image 53).  Sigmoid diverticulosis, without evidence of diverticulitis. Mild mucosal hypertrophy directly above the region of fistulous communication (series 2/image 70).  Vascular/Lymphatic: No evidence of abdominal aortic aneurysm.  Atherosclerotic calcifications of the abdominal aorta and branch vessels.  No suspicious abdominopelvic lymphadenopathy.  Reproductive: Uterus is within normal limits.  Bilateral ovaries are within normal limits.  Other: No abdominopelvic ascites.  No pneumoperitoneum or free air.  Musculoskeletal: Degenerative changes of the visualized thoracolumbar spine.  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  Sigmoid diverticulosis, without evidence of diverticulitis.  No free air.   Electronically Signed   By: Julian Hy M.D.   On: 01/06/2018 13:41  CT scan personally reviewed today and with the patient.  Agree with radiologic interpretation.  Is unclear whether or not the intramural bladder abscess is truly an abscess versus adjacent fluid at the site of the fistulous tract.  It does not appear to be amenable to drainage.  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 Dahlia Byes, agree that she will need sigmoid colectomy with excision of her fistulous tract/abscess on bladder closure. Case was also discussed with Dr. Allen Norris, agree with plan for colonoscopy prior to any surgical intervention to rule out underlying malignancy She has a personal history of bladder cancer although recent cystoscopy was negative for any recurrence, unlikely to be related to bladder malignancy, see below We did go ahead and discussed risk and benefits of surgery today, need for Foley catheter for at least a week postoperatively with cystogram prior to rule out, amongst others.  All questions were answered. - Urinalysis, Complete - CULTURE, URINE COMPREHENSIVE  2. 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

## 2018-02-12 MED ORDER — SODIUM CHLORIDE 0.9 % IV SOLN
1.0000 g | INTRAVENOUS | Status: AC
  Administered 2018-02-13: 1 g via INTRAVENOUS
  Filled 2018-02-12: qty 1

## 2018-02-13 ENCOUNTER — Inpatient Hospital Stay
Admission: RE | Admit: 2018-02-13 | Discharge: 2018-02-15 | DRG: 348 | Disposition: A | Discharge status: Home / Self Care | Payer: Medicare Other | Source: Ambulatory Visit | Attending: Surgery | Admitting: Surgery

## 2018-02-13 ENCOUNTER — Inpatient Hospital Stay: Payer: Medicare Other | Admitting: Anesthesiology

## 2018-02-13 ENCOUNTER — Other Ambulatory Visit: Payer: Self-pay

## 2018-02-13 ENCOUNTER — Encounter
Admission: RE | Disposition: A | Discharge status: Home / Self Care | Payer: Self-pay | Source: Ambulatory Visit | Attending: Surgery

## 2018-02-13 DIAGNOSIS — E785 Hyperlipidemia, unspecified: Secondary | ICD-10-CM | POA: Diagnosis present

## 2018-02-13 DIAGNOSIS — I10 Essential (primary) hypertension: Secondary | ICD-10-CM | POA: Diagnosis present

## 2018-02-13 DIAGNOSIS — Z888 Allergy status to other drugs, medicaments and biological substances status: Secondary | ICD-10-CM | POA: Diagnosis not present

## 2018-02-13 DIAGNOSIS — Z7982 Long term (current) use of aspirin: Secondary | ICD-10-CM | POA: Diagnosis not present

## 2018-02-13 DIAGNOSIS — I2510 Atherosclerotic heart disease of native coronary artery without angina pectoris: Secondary | ICD-10-CM | POA: Diagnosis present

## 2018-02-13 DIAGNOSIS — K660 Peritoneal adhesions (postprocedural) (postinfection): Secondary | ICD-10-CM | POA: Diagnosis present

## 2018-02-13 DIAGNOSIS — G4730 Sleep apnea, unspecified: Secondary | ICD-10-CM | POA: Diagnosis present

## 2018-02-13 DIAGNOSIS — K219 Gastro-esophageal reflux disease without esophagitis: Secondary | ICD-10-CM | POA: Diagnosis present

## 2018-02-13 DIAGNOSIS — Z79899 Other long term (current) drug therapy: Secondary | ICD-10-CM | POA: Diagnosis not present

## 2018-02-13 DIAGNOSIS — E039 Hypothyroidism, unspecified: Secondary | ICD-10-CM | POA: Diagnosis present

## 2018-02-13 DIAGNOSIS — D649 Anemia, unspecified: Secondary | ICD-10-CM | POA: Diagnosis present

## 2018-02-13 DIAGNOSIS — Z96653 Presence of artificial knee joint, bilateral: Secondary | ICD-10-CM | POA: Diagnosis present

## 2018-02-13 DIAGNOSIS — K2270 Barrett's esophagus without dysplasia: Secondary | ICD-10-CM | POA: Diagnosis present

## 2018-02-13 DIAGNOSIS — K5732 Diverticulitis of large intestine without perforation or abscess without bleeding: Secondary | ICD-10-CM | POA: Diagnosis present

## 2018-02-13 DIAGNOSIS — Z87891 Personal history of nicotine dependence: Secondary | ICD-10-CM

## 2018-02-13 DIAGNOSIS — Z9989 Dependence on other enabling machines and devices: Secondary | ICD-10-CM | POA: Diagnosis not present

## 2018-02-13 DIAGNOSIS — N321 Vesicointestinal fistula: Secondary | ICD-10-CM

## 2018-02-13 DIAGNOSIS — Z8551 Personal history of malignant neoplasm of bladder: Secondary | ICD-10-CM | POA: Diagnosis not present

## 2018-02-13 DIAGNOSIS — Z9049 Acquired absence of other specified parts of digestive tract: Secondary | ICD-10-CM

## 2018-02-13 HISTORY — PX: LAPAROSCOPIC SIGMOID COLECTOMY: SHX5928

## 2018-02-13 LAB — CBC
HCT: 43.8 % (ref 35.0–47.0)
Hemoglobin: 14.2 g/dL (ref 12.0–16.0)
MCH: 28.7 pg (ref 26.0–34.0)
MCHC: 32.5 g/dL (ref 32.0–36.0)
MCV: 88.1 fL (ref 80.0–100.0)
PLATELETS: 238 10*3/uL (ref 150–440)
RBC: 4.97 MIL/uL (ref 3.80–5.20)
RDW: 14.6 % — AB (ref 11.5–14.5)
WBC: 14.9 10*3/uL — AB (ref 3.6–11.0)

## 2018-02-13 LAB — CREATININE, SERUM
Creatinine, Ser: 0.53 mg/dL (ref 0.44–1.00)
GFR calc Af Amer: >60 mL/min (ref >60)
GFR calc non Af Amer: >60 mL/min (ref >60)

## 2018-02-13 SURGERY — COLECTOMY, SIGMOID, LAPAROSCOPIC
Anesthesia: General | Site: Abdomen | Wound class: Clean Contaminated

## 2018-02-13 MED ORDER — FENTANYL CITRATE (PF) 100 MCG/2ML IJ SOLN
INTRAMUSCULAR | Status: DC | PRN
  Administered 2018-02-13 (×2): 50 ug via INTRAVENOUS
  Administered 2018-02-13: 100 ug via INTRAVENOUS
  Administered 2018-02-13: 50 ug via INTRAVENOUS

## 2018-02-13 MED ORDER — PANTOPRAZOLE SODIUM 40 MG PO TBEC
40.0000 mg | DELAYED_RELEASE_TABLET | Freq: Every day | ORAL | Status: DC
  Administered 2018-02-13 – 2018-02-15 (×3): 40 mg via ORAL
  Filled 2018-02-13 (×3): qty 1

## 2018-02-13 MED ORDER — MORPHINE SULFATE (PF) 4 MG/ML IV SOLN: 4.0000 mg | INTRAVENOUS | Status: DC | PRN

## 2018-02-13 MED ORDER — PHENYLEPHRINE HCL 10 MG/ML IJ SOLN
INTRAMUSCULAR | Status: DC | PRN
  Administered 2018-02-13 (×5): 100 ug via INTRAVENOUS

## 2018-02-13 MED ORDER — ACETAMINOPHEN 500 MG PO TABS
1000.0000 mg | ORAL_TABLET | ORAL | Status: AC
  Administered 2018-02-13: 1000 mg via ORAL

## 2018-02-13 MED ORDER — ACETAMINOPHEN 10 MG/ML IV SOLN
1000.0000 mg | Freq: Once | INTRAVENOUS | Status: AC
  Administered 2018-02-13: 1000 mg via INTRAVENOUS

## 2018-02-13 MED ORDER — METHYLENE BLUE 0.5 % INJ SOLN
INTRAVENOUS | Status: AC
  Filled 2018-02-13: qty 10

## 2018-02-13 MED ORDER — DEXAMETHASONE SODIUM PHOSPHATE 10 MG/ML IJ SOLN
INTRAMUSCULAR | Status: DC | PRN
  Administered 2018-02-13: 10 mg via INTRAVENOUS

## 2018-02-13 MED ORDER — KETOROLAC TROMETHAMINE 15 MG/ML IJ SOLN
15.0000 mg | Freq: Four times a day (QID) | INTRAMUSCULAR | Status: DC
  Administered 2018-02-13 – 2018-02-15 (×7): 15 mg via INTRAVENOUS
  Filled 2018-02-13 (×7): qty 1

## 2018-02-13 MED ORDER — ACETAMINOPHEN 500 MG PO TABS
1000.0000 mg | ORAL_TABLET | Freq: Four times a day (QID) | ORAL | Status: DC
  Administered 2018-02-14 – 2018-02-15 (×6): 1000 mg via ORAL
  Filled 2018-02-13 (×7): qty 2

## 2018-02-13 MED ORDER — POLYMYXIN B-TRIMETHOPRIM 10000-0.1 UNIT/ML-% OP SOLN
1.0000 [drp] | Freq: Three times a day (TID) | OPHTHALMIC | Status: AC
  Administered 2018-02-13 – 2018-02-14 (×3): 1 [drp] via OPHTHALMIC
  Filled 2018-02-13: qty 10

## 2018-02-13 MED ORDER — CHLORHEXIDINE GLUCONATE CLOTH 2 % EX PADS: 6.0000 | MEDICATED_PAD | Freq: Once | CUTANEOUS | Status: DC

## 2018-02-13 MED ORDER — ONDANSETRON 4 MG PO TBDP: 4.0000 mg | ORAL_TABLET | Freq: Four times a day (QID) | ORAL | Status: DC | PRN

## 2018-02-13 MED ORDER — ONDANSETRON HCL 4 MG/2ML IJ SOLN
INTRAMUSCULAR | Status: DC | PRN
  Administered 2018-02-13: 4 mg via INTRAVENOUS

## 2018-02-13 MED ORDER — PROPOFOL 10 MG/ML IV BOLUS
INTRAVENOUS | Status: DC | PRN
  Administered 2018-02-13: 170 mg via INTRAVENOUS

## 2018-02-13 MED ORDER — LISINOPRIL 20 MG PO TABS
20.0000 mg | ORAL_TABLET | Freq: Once | ORAL | Status: AC
  Administered 2018-02-13: 20 mg via ORAL
  Filled 2018-02-13: qty 1

## 2018-02-13 MED ORDER — ONDANSETRON HCL 4 MG/2ML IJ SOLN
4.0000 mg | Freq: Once | INTRAMUSCULAR | Status: AC | PRN
  Administered 2018-02-13: 4 mg via INTRAVENOUS

## 2018-02-13 MED ORDER — SODIUM CHLORIDE 0.9 % IJ SOLN
INTRAMUSCULAR | Status: AC
  Filled 2018-02-13: qty 50

## 2018-02-13 MED ORDER — OXYCODONE HCL 5 MG PO TABS
5.0000 mg | ORAL_TABLET | ORAL | Status: DC | PRN
  Administered 2018-02-13 – 2018-02-14 (×2): 5 mg via ORAL
  Filled 2018-02-13 (×2): qty 1

## 2018-02-13 MED ORDER — BSS IO SOLN
15.0000 mL | Freq: Once | INTRAOCULAR | Status: DC
  Filled 2018-02-13: qty 15

## 2018-02-13 MED ORDER — GABAPENTIN 300 MG PO CAPS
300.0000 mg | ORAL_CAPSULE | ORAL | Status: AC
  Administered 2018-02-13: 300 mg via ORAL

## 2018-02-13 MED ORDER — SUGAMMADEX SODIUM 200 MG/2ML IV SOLN
INTRAVENOUS | Status: DC | PRN
  Administered 2018-02-13: 200 mg via INTRAVENOUS

## 2018-02-13 MED ORDER — HEPARIN SODIUM (PORCINE) 5000 UNIT/ML IJ SOLN
5000.0000 [IU] | Freq: Once | INTRAMUSCULAR | Status: AC
  Administered 2018-02-13: 5000 [IU] via SUBCUTANEOUS

## 2018-02-13 MED ORDER — LACTATED RINGERS IV SOLN
INTRAVENOUS | Status: DC
  Administered 2018-02-13 (×2): via INTRAVENOUS

## 2018-02-13 MED ORDER — LIDOCAINE HCL (PF) 2 % IJ SOLN
INTRAMUSCULAR | Status: AC
  Filled 2018-02-13: qty 10

## 2018-02-13 MED ORDER — FENTANYL CITRATE (PF) 100 MCG/2ML IJ SOLN
INTRAMUSCULAR | Status: AC
  Administered 2018-02-13: 25 ug via INTRAVENOUS
  Filled 2018-02-13: qty 2

## 2018-02-13 MED ORDER — FENTANYL CITRATE (PF) 100 MCG/2ML IJ SOLN
25.0000 ug | INTRAMUSCULAR | Status: DC | PRN
  Administered 2018-02-13 (×3): 25 ug via INTRAVENOUS

## 2018-02-13 MED ORDER — PHENYLEPHRINE HCL 10 MG/ML IJ SOLN
INTRAMUSCULAR | Status: AC
  Filled 2018-02-13: qty 1

## 2018-02-13 MED ORDER — FENTANYL CITRATE (PF) 250 MCG/5ML IJ SOLN
INTRAMUSCULAR | Status: AC
  Filled 2018-02-13: qty 5

## 2018-02-13 MED ORDER — LIDOCAINE HCL (CARDIAC) PF 100 MG/5ML IV SOSY
PREFILLED_SYRINGE | INTRAVENOUS | Status: DC | PRN
  Administered 2018-02-13: 100 mg via INTRAVENOUS

## 2018-02-13 MED ORDER — BUPIVACAINE LIPOSOME 1.3 % IJ SUSP
INTRAMUSCULAR | Status: DC | PRN
  Administered 2018-02-13: 20 mL

## 2018-02-13 MED ORDER — PROCHLORPERAZINE MALEATE 10 MG PO TABS
10.0000 mg | ORAL_TABLET | Freq: Four times a day (QID) | ORAL | Status: DC | PRN
  Filled 2018-02-13: qty 1

## 2018-02-13 MED ORDER — SUGAMMADEX SODIUM 200 MG/2ML IV SOLN
INTRAVENOUS | Status: AC
  Filled 2018-02-13: qty 2

## 2018-02-13 MED ORDER — BUPIVACAINE-EPINEPHRINE (PF) 0.25% -1:200000 IJ SOLN
INTRAMUSCULAR | Status: DC | PRN
  Administered 2018-02-13: 30 mL via PERINEURAL

## 2018-02-13 MED ORDER — BUPIVACAINE LIPOSOME 1.3 % IJ SUSP: 20.0000 mL | Freq: Once | INTRAMUSCULAR | Status: DC

## 2018-02-13 MED ORDER — LACTATED RINGERS IV SOLN
INTRAVENOUS | Status: DC
  Administered 2018-02-13 – 2018-02-14 (×2): via INTRAVENOUS

## 2018-02-13 MED ORDER — DEXAMETHASONE SODIUM PHOSPHATE 10 MG/ML IJ SOLN
INTRAMUSCULAR | Status: AC
  Filled 2018-02-13: qty 1

## 2018-02-13 MED ORDER — PROCHLORPERAZINE EDISYLATE 10 MG/2ML IJ SOLN
5.0000 mg | Freq: Four times a day (QID) | INTRAMUSCULAR | Status: DC | PRN
  Filled 2018-02-13: qty 2

## 2018-02-13 MED ORDER — SODIUM CHLORIDE 0.9 % IJ SOLN
INTRAMUSCULAR | Status: DC | PRN
  Administered 2018-02-13: 50 mL via INTRAVENOUS

## 2018-02-13 MED ORDER — ROCURONIUM BROMIDE 50 MG/5ML IV SOLN
INTRAVENOUS | Status: AC
  Filled 2018-02-13: qty 2

## 2018-02-13 MED ORDER — ROCURONIUM BROMIDE 100 MG/10ML IV SOLN
INTRAVENOUS | Status: DC | PRN
  Administered 2018-02-13: 50 mg via INTRAVENOUS
  Administered 2018-02-13: 30 mg via INTRAVENOUS

## 2018-02-13 MED ORDER — KETOROLAC TROMETHAMINE 0.5 % OP SOLN
1.0000 [drp] | Freq: Three times a day (TID) | OPHTHALMIC | Status: AC | PRN
  Administered 2018-02-13: 1 [drp] via OPHTHALMIC
  Filled 2018-02-13: qty 3

## 2018-02-13 MED ORDER — GABAPENTIN 300 MG PO CAPS
ORAL_CAPSULE | ORAL | Status: AC
  Administered 2018-02-13: 300 mg via ORAL
  Filled 2018-02-13: qty 1

## 2018-02-13 MED ORDER — POLYMYXIN B-TRIMETHOPRIM 10000-0.1 UNIT/ML-% OP SOLN
OPHTHALMIC | Status: AC
  Administered 2018-02-13: 1 [drp] via OPHTHALMIC
  Filled 2018-02-13: qty 10

## 2018-02-13 MED ORDER — ONDANSETRON HCL 4 MG/2ML IJ SOLN
INTRAMUSCULAR | Status: AC
  Filled 2018-02-13: qty 2

## 2018-02-13 MED ORDER — BUPIVACAINE LIPOSOME 1.3 % IJ SUSP
INTRAMUSCULAR | Status: AC
  Filled 2018-02-13: qty 20

## 2018-02-13 MED ORDER — ACETAMINOPHEN 10 MG/ML IV SOLN
INTRAVENOUS | Status: AC
  Administered 2018-02-13: 1000 mg via INTRAVENOUS
  Filled 2018-02-13: qty 100

## 2018-02-13 MED ORDER — ACETAMINOPHEN 500 MG PO TABS
ORAL_TABLET | ORAL | Status: AC
  Administered 2018-02-13: 1000 mg via ORAL
  Filled 2018-02-13: qty 2

## 2018-02-13 MED ORDER — CARVEDILOL 6.25 MG PO TABS
6.2500 mg | ORAL_TABLET | Freq: Two times a day (BID) | ORAL | Status: DC
  Administered 2018-02-13 – 2018-02-15 (×4): 6.25 mg via ORAL
  Filled 2018-02-13 (×3): qty 1
  Filled 2018-02-13: qty 0.5

## 2018-02-13 MED ORDER — HEPARIN SODIUM (PORCINE) 5000 UNIT/ML IJ SOLN
INTRAMUSCULAR | Status: AC
  Administered 2018-02-13: 5000 [IU] via SUBCUTANEOUS
  Filled 2018-02-13: qty 1

## 2018-02-13 MED ORDER — BUPIVACAINE-EPINEPHRINE (PF) 0.25% -1:200000 IJ SOLN
INTRAMUSCULAR | Status: AC
  Filled 2018-02-13: qty 30

## 2018-02-13 MED ORDER — ENOXAPARIN SODIUM 40 MG/0.4ML ~~LOC~~ SOLN
40.0000 mg | SUBCUTANEOUS | Status: DC
  Administered 2018-02-14 – 2018-02-15 (×2): 40 mg via SUBCUTANEOUS
  Filled 2018-02-13 (×2): qty 0.4

## 2018-02-13 MED ORDER — PROPOFOL 10 MG/ML IV BOLUS
INTRAVENOUS | Status: AC
  Filled 2018-02-13: qty 20

## 2018-02-13 MED ORDER — ONDANSETRON HCL 4 MG/2ML IJ SOLN: 4.0000 mg | Freq: Four times a day (QID) | INTRAMUSCULAR | Status: DC | PRN

## 2018-02-13 MED ORDER — MIDAZOLAM HCL 2 MG/2ML IJ SOLN
INTRAMUSCULAR | Status: AC
  Filled 2018-02-13: qty 2

## 2018-02-13 MED ORDER — CARVEDILOL 25 MG PO TABS
ORAL_TABLET | ORAL | Status: AC
  Filled 2018-02-13: qty 1

## 2018-02-13 SURGICAL SUPPLY — 90 items
ADH SKN CLS APL DERMABOND .7 (GAUZE/BANDAGES/DRESSINGS) ×1
APPLIER CLIP 13 LRG OPEN (CLIP)
APR CLP LRG 13 20 CLIP (CLIP)
BAG DECANTER FOR FLEXI CONT (MISCELLANEOUS) ×2 IMPLANT
BLADE CLIPPER SURG (BLADE) ×1 IMPLANT
BLADE SURG 15 STRL LF DISP TIS (BLADE) ×1 IMPLANT
BLADE SURG 15 STRL SS (BLADE) ×2
BLADE SURG SZ10 CARB STEEL (BLADE) ×2 IMPLANT
BLADE SURG SZ11 CARB STEEL (BLADE) ×2 IMPLANT
BNDG CMPR 75X21 PLY HI ABS (MISCELLANEOUS)
BULB RESERV EVAC DRAIN JP 100C (MISCELLANEOUS) ×1 IMPLANT
CANISTER SUCT 1200ML W/VALVE (MISCELLANEOUS) ×2 IMPLANT
CATH ROBINSON RED 14FR (CATHETERS) ×1 IMPLANT
CATH ROBINSON RED A/P 16FR (CATHETERS) ×1 IMPLANT
CATH URET ROBINSON RED 14FR (CATHETERS)
CHLORAPREP W/TINT 26ML (MISCELLANEOUS) ×2 IMPLANT
CLIP APPLIE 13 LRG OPEN (CLIP) ×1 IMPLANT
DEFOGGER SCOPE WARMER CLEARIFY (MISCELLANEOUS) ×2 IMPLANT
DERMABOND ADVANCED (GAUZE/BANDAGES/DRESSINGS) ×1
DERMABOND ADVANCED .7 DNX12 (GAUZE/BANDAGES/DRESSINGS) ×2 IMPLANT
DRAPE INCISE IOBAN 66X45 STRL (DRAPES) ×2 IMPLANT
DRAPE LEGGINS SURG 28X43 STRL (DRAPES) ×2 IMPLANT
DRAPE UNDER BUTTOCK W/FLU (DRAPES) ×2 IMPLANT
ELECT BLADE 6.5 EXT (BLADE) ×2 IMPLANT
ELECT REM PT RETURN 9FT ADLT (ELECTROSURGICAL) ×2
ELECTRODE REM PT RTRN 9FT ADLT (ELECTROSURGICAL) ×1 IMPLANT
GAUZE SPONGE 4X4 12PLY STRL (GAUZE/BANDAGES/DRESSINGS) ×1 IMPLANT
GAUZE STRETCH 2X75IN STRL (MISCELLANEOUS) ×1 IMPLANT
GLOVE BIO SURGEON STRL SZ 6.5 (GLOVE) ×2 IMPLANT
GLOVE BIO SURGEON STRL SZ7 (GLOVE) ×5 IMPLANT
GLOVE BIOGEL PI IND STRL 6.5 (GLOVE) ×1 IMPLANT
GLOVE BIOGEL PI INDICATOR 6.5 (GLOVE) ×1
GOWN STRL REUS W/ TWL LRG LVL3 (GOWN DISPOSABLE) ×1 IMPLANT
GOWN STRL REUS W/TWL LRG LVL3 (GOWN DISPOSABLE) ×2
GOWN STRL REUS W/TWL LRG LVL4 (GOWN DISPOSABLE) ×10 IMPLANT
GRADUATE 1200CC STRL 31836 (MISCELLANEOUS) ×1 IMPLANT
HANDLE SUCTION POOLE (INSTRUMENTS) ×1 IMPLANT
HANDLE YANKAUER SUCT BULB TIP (MISCELLANEOUS) ×2 IMPLANT
IRRIGATION STRYKERFLOW (MISCELLANEOUS) ×1 IMPLANT
IRRIGATOR STRYKERFLOW (MISCELLANEOUS) ×2
IV NS 1000ML (IV SOLUTION) ×2
IV NS 1000ML BAXH (IV SOLUTION) ×1 IMPLANT
JACKSON PRATT 10 (INSTRUMENTS) ×1 IMPLANT
L-HOOK LAP DISP 36CM (ELECTROSURGICAL) ×2
LHOOK LAP DISP 36CM (ELECTROSURGICAL) ×1 IMPLANT
MARKER SKIN DUAL TIP RULER LAB (MISCELLANEOUS) ×1 IMPLANT
NEEDLE HYPO 22GX1.5 SAFETY (NEEDLE) ×2 IMPLANT
NS IRRIG 1000ML POUR BTL (IV SOLUTION) ×2 IMPLANT
PACK COLON CLEAN CLOSURE (MISCELLANEOUS) ×2 IMPLANT
PACK LAP CHOLECYSTECTOMY (MISCELLANEOUS) ×2 IMPLANT
PENCIL ELECTRO HAND CTR (MISCELLANEOUS) ×2 IMPLANT
RELOAD STAPLE 60 2.6 WHT THN (STAPLE) IMPLANT
RELOAD STAPLE 60 3.6 BLU REG (STAPLE) ×1 IMPLANT
RELOAD STAPLER BLUE 60MM (STAPLE) ×2 IMPLANT
RELOAD STAPLER WHITE 60MM (STAPLE) ×1 IMPLANT
SCISSORS METZENBAUM CVD 33 (INSTRUMENTS) ×1 IMPLANT
SHEARS HARMONIC ACE PLUS 36CM (ENDOMECHANICALS) ×2 IMPLANT
SLEEVE ENDOPATH XCEL 5M (ENDOMECHANICALS) ×2 IMPLANT
SPONGE LAP 18X18 RF (DISPOSABLE) ×2 IMPLANT
STAPLE ECHEON FLEX 60 POW ENDO (STAPLE) ×2 IMPLANT
STAPLER CIRCULAR 29MM (STAPLE) IMPLANT
STAPLER ENDO ILS CVD 18 33 (STAPLE) IMPLANT
STAPLER PROX 25M (MISCELLANEOUS) IMPLANT
STAPLER RELOAD BLUE 60MM (STAPLE) ×4
STAPLER RELOAD WHITE 60MM (STAPLE) ×2
STAPLER SKIN PROX 35W (STAPLE) ×2 IMPLANT
STAPLER TA28 THK CONTR EEA XL (STAPLE) ×1 IMPLANT
SUCT SIGMOIDOSCOPE TIP 18 W/TU (SUCTIONS) ×1 IMPLANT
SUCTION POOLE HANDLE (INSTRUMENTS)
SUT MNCRL AB 4-0 PS2 18 (SUTURE) ×4 IMPLANT
SUT PDS AB 0 CT1 27 (SUTURE) ×4 IMPLANT
SUT SILK 2 0 (SUTURE) ×2
SUT SILK 2 0SH CR/8 30 (SUTURE) ×2 IMPLANT
SUT SILK 2-0 (SUTURE) ×2 IMPLANT
SUT SILK 2-0 18XBRD TIE 12 (SUTURE) ×1 IMPLANT
SUT SILK 3-0 (SUTURE) ×2 IMPLANT
SUT VIC AB 2-0 SH 27 (SUTURE) ×6
SUT VIC AB 2-0 SH 27XBRD (SUTURE) ×3 IMPLANT
SUT VIC AB 3-0 SH 27 (SUTURE) ×2
SUT VIC AB 3-0 SH 27X BRD (SUTURE) ×1 IMPLANT
SYR 20CC LL (SYRINGE) ×4 IMPLANT
SYR 50ML LL SCALE MARK (SYRINGE) ×2 IMPLANT
SYRINGE IRR TOOMEY STRL 70CC (SYRINGE) ×2 IMPLANT
SYS LAPSCP GELPORT 120MM (MISCELLANEOUS) ×2
SYSTEM LAPSCP GELPORT 120MM (MISCELLANEOUS) ×1 IMPLANT
TOWEL OR 17X26 4PK STRL BLUE (TOWEL DISPOSABLE) ×1 IMPLANT
TRAY FOLEY MTR SLVR 16FR STAT (SET/KITS/TRAYS/PACK) ×2 IMPLANT
TROCAR XCEL 12X100 BLDLESS (ENDOMECHANICALS) ×2 IMPLANT
TROCAR XCEL NON-BLD 5MMX100MML (ENDOMECHANICALS) ×2 IMPLANT
TUBING INSUFFLATION (TUBING) ×2 IMPLANT

## 2018-02-13 NOTE — Anesthesia Preprocedure Evaluation (Addendum)
Anesthesia Evaluation  Patient identified by MRN, date of birth, ID band Patient awake    Reviewed: Allergy & Precautions, NPO status , Patient's Chart, lab work & pertinent test results  History of Anesthesia Complications Negative for: history of anesthetic complications  Airway Mallampati: II       Dental   Pulmonary sleep apnea and Continuous Positive Airway Pressure Ventilation , neg COPD, former smoker,           Cardiovascular hypertension, Pt. on medications + CAD  (-) CHF (-) dysrhythmias (-) Valvular Problems/Murmurs     Neuro/Psych neg Seizures    GI/Hepatic Neg liver ROS, GERD  Medicated and Controlled,  Endo/Other  neg diabetesHypothyroidism   Renal/GU negative Renal ROS     Musculoskeletal   Abdominal   Peds  Hematology  (+) anemia ,   Anesthesia Other Findings   Reproductive/Obstetrics                            Anesthesia Physical Anesthesia Plan  ASA: III  Anesthesia Plan: General   Post-op Pain Management:    Induction:   PONV Risk Score and Plan: 3 and Dexamethasone, Ondansetron and Midazolam  Airway Management Planned: Oral ETT  Additional Equipment:   Intra-op Plan:   Post-operative Plan:   Informed Consent: I have reviewed the patients History and Physical, chart, labs and discussed the procedure including the risks, benefits and alternatives for the proposed anesthesia with the patient or authorized representative who has indicated his/her understanding and acceptance.     Plan Discussed with:   Anesthesia Plan Comments:         Anesthesia Quick Evaluation

## 2018-02-13 NOTE — Addendum Note (Signed)
Addendum  created 02/13/18 1832 by Durenda Hurt, MD   Order Reconciliation Section accessed, Order list changed, Order sets accessed, Sign clinical note

## 2018-02-13 NOTE — Anesthesia Post-op Follow-up Note (Signed)
Anesthesia QCDR form completed.        

## 2018-02-13 NOTE — Anesthesia Procedure Notes (Signed)
Procedure Name: Intubation Date/Time: 02/13/2018 2:35 PM Performed by: Aline Brochure, CRNA Pre-anesthesia Checklist: Patient identified, Emergency Drugs available, Suction available and Patient being monitored Patient Re-evaluated:Patient Re-evaluated prior to induction Oxygen Delivery Method: Circle system utilized Preoxygenation: Pre-oxygenation with 100% oxygen Induction Type: IV induction Laryngoscope Size: Mac and 3 Grade View: Grade I Tube type: Oral Tube size: 7.0 mm Number of attempts: 1 Airway Equipment and Method: Stylet Placement Confirmation: ETT inserted through vocal cords under direct vision,  positive ETCO2 and breath sounds checked- equal and bilateral Secured at: 22 cm Tube secured with: Tape Dental Injury: Teeth and Oropharynx as per pre-operative assessment

## 2018-02-13 NOTE — Anesthesia Postprocedure Evaluation (Addendum)
Anesthesia Post Note  Patient: MARAKI MACQUARRIE  Procedure(s) Performed: LAPAROSCOPIC SIGMOID COLECTOMY (N/A Abdomen) COLOVESICAL REPAIR (N/A Abdomen)  Patient location during evaluation: PACU Anesthesia Type: General Level of consciousness: awake and alert Pain management: pain level controlled Vital Signs Assessment: post-procedure vital signs reviewed and stable Respiratory status: spontaneous breathing, nonlabored ventilation and respiratory function stable Cardiovascular status: blood pressure returned to baseline and stable Postop Assessment: no apparent nausea or vomiting Anesthetic complications: yes Anesthetic complication details: anesthesia complicationsComments:  Pt c/o that it "feels like something is in my right eye."  No foreign body seen.  Sterile saline eye drops offer temporary relief only.  Corneal abrasion suspected.  Sterile saline, Toradol and Polymyxin eye drops ordered per corneal abrasion orderset, and inpatient ophthalmology consult placed for tomorrow. BP also elevated to 180/95 (pre-op BP 173/83).  While in PACU pt will receive her usual PM carvedilol dose and half of her home lisinopril dose (which she did not take this morning).      Last Vitals:  Vitals:   02/13/18 1711 02/13/18 1726  BP: (!) 170/88 (!) 186/95  Pulse: 81 71  Resp: 15 15  Temp:    SpO2: 99% 99%    Last Pain:  Vitals:   02/13/18 1740  TempSrc:   PainSc: Ossian Fitzgerald

## 2018-02-13 NOTE — Interval H&P Note (Signed)
History and Physical Interval Note:  02/13/2018 12:02 PM  XOE HOE  has presented today for surgery, with the diagnosis of colovesical fistula  The various methods of treatment have been discussed with the patient and family. After consideration of risks, benefits and other options for treatment, the patient has consented to  Procedure(s): LAPAROSCOPIC SIGMOID COLECTOMY (N/A) COLOVESICAL REPAIR (N/A) as a surgical intervention .  The patient's history has been reviewed, patient examined, no change in status, stable for surgery.  I have reviewed the patient's chart and labs.  Questions were answered to the patient's satisfaction.     [place]

## 2018-02-13 NOTE — Transfer of Care (Signed)
Immediate Anesthesia Transfer of Care Note  Patient: Barbara Hernandez  Procedure(s) Performed: LAPAROSCOPIC SIGMOID COLECTOMY (N/A Abdomen) COLOVESICAL REPAIR (N/A Abdomen)  Patient Location: PACU  Anesthesia Type:General  Level of Consciousness: sedated  Airway & Oxygen Therapy: Patient connected to face mask oxygen  Post-op Assessment: Post -op Vital signs reviewed and stable  Post vital signs: stable  Last Vitals:  Vitals Value Taken Time  BP 170/88 02/13/2018  5:11 PM  Temp    Pulse 81 02/13/2018  5:11 PM  Resp 13 02/13/2018  5:11 PM  SpO2 100 % 02/13/2018  5:11 PM  Vitals shown include unvalidated device data.  Last Pain:  Vitals:   02/13/18 1141  TempSrc: Temporal  PainSc: 0-No pain         Complications: No apparent anesthesia complications

## 2018-02-13 NOTE — Interval H&P Note (Signed)
History and Physical Interval Note:  02/13/2018 2:04 PM  Barbara Hernandez  has presented today for surgery, with the diagnosis of colovesical fistula  The various methods of treatment have been discussed with the patient and family. After consideration of risks, benefits and other options for treatment, the patient has consented to  Procedure(s): LAPAROSCOPIC SIGMOID COLECTOMY (N/A) COLOVESICAL REPAIR (N/A) as a surgical intervention .  The patient's history has been reviewed, patient examined, no change in status, stable for surgery.  I have reviewed the patient's chart and labs.  Questions were answered to the patient's satisfaction.    [facility] CTAB  Hollice Espy

## 2018-02-13 NOTE — Op Note (Addendum)
PROCEDURES: 1. Laparoscopic lysis of adhesions 2. Laparoscopic sigmoid colectomy 3. Laparoscopic takedown of splenic flexure   Pre-operative Diagnosis: Chronic diverticulitis with colovesical fistula  Post-operative Diagnosis: same  Surgeon: Marjory Lies Pabon   Assistants: Dr. Erlene Quan required for the anastomosis and Evaluation of colovesical fistula  Anesthesia: General endotracheal anesthesia  ASA Class: 2   Surgeon: Caroleen Hamman , MD FACS  Anesthesia: Gen. with endotracheal tube  Findings: Chronic diverticulitis No evidence of blue dye from the bladder No evidence of Anastomotic leak  Estimated Blood Loss: 50cc         Drains: 15 FR pelvis         Specimens: sigmoid colon       Complications: none               Condition: stable  Procedure Details  The patient was seen again in the Holding Room. The benefits, complications, treatment options, and expected outcomes were discussed with the patient. The risks of bleeding, infection, recurrence of symptoms, failure to resolve symptoms,  bowel injury, any of which could require further surgery were reviewed with the patient.   The patient was taken to Operating Room, identified as Barbara Hernandez and the procedure verified.  A Time Out was held and the above information confirmed.  Prior to the induction of general anesthesia, antibiotic prophylaxis was administered. VTE prophylaxis was in place. General endotracheal anesthesia was then administered and tolerated well. After the induction, the abdomen was prepped with Chloraprep and draped in the sterile fashion. The patient was positioned in the supine position. Prior to the induction of general anesthesia, antibiotic prophylaxis was administered. VTE prophylaxis was in place. General endotracheal anesthesia was then administered and tolerated well. After the induction, the abdomen was prepped with Chloraprep and draped in the sterile fashion. The patient was positioned in  lithotomy position. 7 cm incision was created as a midline mini laparotomy. The abdominal cavity was entered under direct visualization and the GelPort device was placed. A 5 mm port was placed in the suprapubic area under direct visualization and pneumoperitoneum was obtained. There were dense adhesions from the omentum to the abdominal wall that where lysed in the standard fashion with the Harmonic scalpel. We also were able to place a 12 mm port in the right lower quadrant and a 5 mm port in the left lower quadrant under direct visualization. There was significant adhesive disease in the pelvis from the sigmoid to the pelvic wall and also from the sigmoid to the ovary and the uterus. These adhesions were lysed with a combination of finger fracturing and Harmonic scalpel. The white line of Toldt was identified and divided and we mobilized the descending colon IN a lateral to medial fashion. We preserved the ureter at all times. We were also able to mobilize the splenic flexure using Harmonic scalpel in the standard fashion. We identified the takeoff of the inferior mesenteric artery dissected the pedicle and divided using a 60 mm vascular echelon stapler in the standard fashion. Using the Harmonic's scalpel were able to divide the mesorectum and and also divided proximal to the mesentery of the descending colon. Once we have an adequate visualization and mobilization we divided the sigmoid colon distally at the junction of the rectosigmoid area with multiple blue loads using the echelon stapler. Blu dye was instilled via the three way foley and no evidence of dye extravasation was observed.   We removed the GelPort and visualized the colon in a direct fashion.  An divided at the mid descending colon with standard 60 mm blue load. We opened the bowel and measure the diameter of the bowel. A 25 mm dilator was perfect size. A pursestring was used after inserting the anvil device. Dr. Erlene Quan was able to pass a  25 mm standard EEA stapler device through the anus and we had a little bit of difficulty but were able to finally pass the device through the end of the rectal stump. Under direct visualization we perform an end to end anastomosis with the EEA device. A leak test was performed inflating the colon with a Toomey syringe and a rubber catheter. No evidence of leak was observed. There was also adequate hemostasis. A 15 Blake drain was placed in the pelvis. We were able to mobilize the omentum and I created an omental flap to attach it to the anastomosis. The drain was sutured in place with a 3-0  Nylon. All the laparoscopic ports were removed and a second look showed no evidence of any bleeding or any other injuries. We changed gloves and place a new tray to close the abdomen with a 0 PDS suture in a running fashion and the skin was closed with 4-0 Monocryl. Liposomal Marcaine was injected on all incision sites under direct visualization. Dermabond was used to coat all the skin incisions. Needle and laparotomy count were correct and there were no immediate occasions  Caroleen Hamman, MD, FACS

## 2018-02-13 NOTE — Addendum Note (Signed)
Addendum  created 02/13/18 1747 by Durenda Hurt, MD   Sign clinical note

## 2018-02-13 NOTE — Addendum Note (Signed)
Addendum  created 02/13/18 1852 by Durenda Hurt, MD   Sign clinical note

## 2018-02-13 NOTE — Progress Notes (Signed)
RN called RT to assess patients home CPAP. RT found patient home CPAP in good condition with no frayed wires or cords. Will pass along in AM report for Maintenance to assess and sign off on.

## 2018-02-13 NOTE — Interval H&P Note (Signed)
History and Physical Interval Note:  02/13/2018 12:02 PM  Barbara Hernandez  has presented today for surgery, with the diagnosis of colovesical fistula  The various methods of treatment have been discussed with the patient and family. After consideration of risks, benefits and other options for treatment, the patient has consented to  Procedure(s): LAPAROSCOPIC SIGMOID COLECTOMY (N/A) COLOVESICAL REPAIR (N/A) as a surgical intervention .  The patient's history has been reviewed, patient examined, no change in status, stable for surgery.  I have reviewed the patient's chart and labs.  Questions were answered to the patient's satisfaction.     [place]

## 2018-02-13 NOTE — Anesthesia Postprocedure Evaluation (Signed)
Anesthesia Post Note  Patient: TOSCA PLETZ  Procedure(s) Performed: LAPAROSCOPIC SIGMOID COLECTOMY (N/A Abdomen) COLOVESICAL REPAIR (N/A Abdomen)  Patient location during evaluation: PACU Anesthesia Type: General Level of consciousness: awake and alert Pain management: pain level controlled Vital Signs Assessment: post-procedure vital signs reviewed and stable Respiratory status: spontaneous breathing, nonlabored ventilation, respiratory function stable and patient connected to face mask oxygen Cardiovascular status: blood pressure returned to baseline and stable Postop Assessment: no apparent nausea or vomiting Anesthetic complications: no     Last Vitals:  Vitals:   02/13/18 1711 02/13/18 1726  BP: (!) 170/88 (!) 186/95  Pulse: 81 71  Resp: 15 15  Temp:    SpO2: 99% 99%    Last Pain:  Vitals:   02/13/18 1740  TempSrc:   PainSc: [place]

## 2018-02-14 ENCOUNTER — Encounter: Payer: Self-pay | Admitting: Surgery

## 2018-02-14 LAB — MAGNESIUM: MAGNESIUM: 1.7 mg/dL (ref 1.7–2.4)

## 2018-02-14 LAB — CBC
HEMATOCRIT: 38.1 % (ref 35.0–47.0)
HEMOGLOBIN: 12.9 g/dL (ref 12.0–16.0)
MCH: 29.6 pg (ref 26.0–34.0)
MCHC: 34 g/dL (ref 32.0–36.0)
MCV: 86.9 fL (ref 80.0–100.0)
Platelets: 256 10*3/uL (ref 150–440)
RBC: 4.38 MIL/uL (ref 3.80–5.20)
RDW: 14.2 % (ref 11.5–14.5)
WBC: 12.8 10*3/uL — ABNORMAL HIGH (ref 3.6–11.0)

## 2018-02-14 LAB — BASIC METABOLIC PANEL
ANION GAP: 8 (ref 5–15)
BUN: 8 mg/dL (ref 8–23)
CHLORIDE: 99 mmol/L (ref 98–111)
CO2: 26 mmol/L (ref 22–32)
Calcium: 8.5 mg/dL — ABNORMAL LOW (ref 8.9–10.3)
Creatinine, Ser: 0.73 mg/dL (ref 0.44–1.00)
GFR calc non Af Amer: >60 mL/min (ref >60)
Glucose, Bld: 168 mg/dL — ABNORMAL HIGH (ref 70–99)
POTASSIUM: 3.9 mmol/L (ref 3.5–5.1)
SODIUM: 133 mmol/L — AB (ref 135–145)

## 2018-02-14 MED ORDER — ERYTHROMYCIN 5 MG/GM OP OINT
TOPICAL_OINTMENT | Freq: Four times a day (QID) | OPHTHALMIC | Status: DC
  Administered 2018-02-14 – 2018-02-15 (×5): 1 via OPHTHALMIC
  Filled 2018-02-14: qty 1

## 2018-02-14 MED ORDER — LEVOTHYROXINE SODIUM 50 MCG PO TABS
125.0000 ug | ORAL_TABLET | ORAL | Status: DC
  Administered 2018-02-14 – 2018-02-15 (×2): 125 ug via ORAL
  Filled 2018-02-14 (×2): qty 1

## 2018-02-14 MED ORDER — KCL IN DEXTROSE-NACL 20-5-0.45 MEQ/L-%-% IV SOLN
INTRAVENOUS | Status: DC
  Administered 2018-02-14: 16:00:00 via INTRAVENOUS
  Filled 2018-02-14 (×2): qty 1000

## 2018-02-14 MED ORDER — MORPHINE SULFATE (PF) 2 MG/ML IV SOLN: 2.0000 mg | INTRAVENOUS | Status: DC | PRN

## 2018-02-14 NOTE — Progress Notes (Signed)
Dr. George Ina called and talked to this RN and gave order for pt. New order for eye was administered as ordered. Pt. stated that her Right eye feels better after two doses of erythromycin. Per MD, the eye ointment can be administered for 48 hours.  Ophthalmology Consult will be cancelled. Md does not need to see pt.

## 2018-02-14 NOTE — Plan of Care (Signed)
Resting quietly at present,callbell within reach.

## 2018-02-14 NOTE — Progress Notes (Signed)
Urology Consult Follow Up  Subjective: Doing well this morning, no significant comfort.  Foley draining well.  Anti-infectives: Anti-infectives (From admission, onward)   Start     Dose/Rate Route Frequency Ordered Stop   02/13/18 0600  ertapenem (INVANZ) 1,000 mg in sodium chloride 0.9 % 100 mL IVPB     1 g 200 mL/hr over 30 Minutes Intravenous On call to O.R. 02/12/18 2145 02/13/18 1505      Current Facility-Administered Medications  Medication Dose Route Frequency Provider Last Rate Last Dose  . acetaminophen (TYLENOL) tablet 1,000 mg  1,000 mg Oral Q6H Pabon, Diego F, MD   1,000 mg at 02/14/18 0607  . balanced salts (BSS) ophthalmic solution 15 mL  15 mL Irrigation Once Durenda Hurt, MD      . carvedilol (COREG) tablet 6.25 mg  6.25 mg Oral BID WC Pabon, Diego F, MD   6.25 mg at 02/14/18 0950  . enoxaparin (LOVENOX) injection 40 mg  40 mg Subcutaneous Q24H Pabon, [place] F, MD   40 mg at 02/14/18 0607  . erythromycin ophthalmic ointment   Right Eye QID Birder Robson, MD   1 application at 98/33/82 1202  . ketorolac (ACULAR) 0.5 % ophthalmic solution 1 drop  1 drop Right Eye Q8H PRN Durenda Hurt, MD   1 drop at 02/13/18 1848  . ketorolac (TORADOL) 15 MG/ML injection 15 mg  15 mg Intravenous Q6H Pabon, Diego F, MD   15 mg at 02/14/18 1159  . lactated ringers infusion   Intravenous Continuous Pabon, Diego F, MD 100 mL/hr at 02/14/18 1100    . levothyroxine (SYNTHROID, LEVOTHROID) tablet 125 mcg  125 mcg Oral Sunny Schlein, [place]   125 mcg at 02/14/18 1221  . morphine 4 MG/ML injection 4 mg  4 mg Intravenous Q3H PRN Pabon, Diego F, MD      . ondansetron (ZOFRAN-ODT) disintegrating tablet 4 mg  4 mg Oral Q6H PRN Pabon, Diego F, MD       Or  . ondansetron (ZOFRAN) injection 4 mg  4 mg Intravenous Q6H PRN Pabon, Diego F, MD      . oxyCODONE (Oxy IR/ROXICODONE) immediate release tablet 5-10 mg  5-10 mg Oral Q4H PRN Pabon, Diego F, MD   5 mg at 02/14/18 0300  .  pantoprazole (PROTONIX) EC tablet 40 mg  40 mg Oral Daily Pabon, [place] F, MD   40 mg at 02/14/18 0949  . prochlorperazine (COMPAZINE) tablet 10 mg  10 mg Oral Q6H PRN Pabon, Diego F, MD       Or  . prochlorperazine (COMPAZINE) injection 5-10 mg  5-10 mg Intravenous Q6H PRN Pabon, Diego F, MD         Objective: Vital signs in last 24 hours: Temp:  [97.5 F (36.4 C)-98.5 F (36.9 C)] 98.1 F (36.7 C) (08/27 1241) Pulse Rate:  [71-96] 77 (08/27 1241) Resp:  [10-20] 18 (08/27 1241) BP: (140-186)/(69-95) 171/69 (08/27 1241) SpO2:  [92 %-100 %] 97 % (08/27 1241)  Intake/Output from previous day: 08/26 0701 - 08/27 0700 In: 2689.7 [P.O.:600; I.V.:1869.7; IV Piggyback:100] Out: 1655 [Urine:1525; Drains:130] Intake/Output this shift: Total I/O In: 816.8 [P.O.:360; I.V.:456.8] Out: -    Physical Exam  Constitutional: She is oriented to person, place, and time. She appears well-developed and well-nourished.  Pulmonary/Chest: Effort normal. No respiratory distress.  Abdominal: Soft. She exhibits no distension.  Wound clean dry and intact  Genitourinary:  Genitourinary Comments: Foley catheter draining clear yellow urine  Neurological: She is alert and oriented to person, place, and time.  Skin: Skin is warm and dry.  Psychiatric: She has a normal mood and affect.    Lab Results:  Recent Labs    02/13/18 2004 02/14/18 0344  WBC 14.9* 12.8*  HGB 14.2 12.9  HCT 43.8 38.1  PLT 238 256   BMET Recent Labs    02/13/18 2004 02/14/18 0344  NA  --  133*  K  --  3.9  CL  --  99  CO2  --  26  GLUCOSE  --  168*  BUN  --  8  CREATININE 0.53 0.73  CALCIUM  --  8.5*   PT/INR No results for input(s): LABPROT, INR in the last 72 hours. ABG No results for input(s): PHART, HCO3 in the last 72 hours.  Invalid input(s): PCO2, PO2  Studies/Results: No results found.   Assessment: s/p Procedure(s): LAPAROSCOPIC SIGMOID COLECTOMY COLOVESICAL REPAIR  Plan: -Recommend Foley  for 3 days postop as no leak was identified intraoperatively -Patient is scheduled for outpatient follow-up with me in December for her routine cystoscopy, she was advised to follow-up sooner as needed  Urology will sign off, please page with any questions or concerns    LOS: 1 day    Hollice Espy 02/14/2018

## 2018-02-14 NOTE — Progress Notes (Addendum)
SURGICAL PROGRESS NOTE  Patient seen and examined as described below with surgical PA-C, Ardell Isaacs.  Assessment/Plan: 77 y.o. female doing very well 1 Day Post-Op s/p laparoscopic-assisted sigmoid colectomy for diverticular disease with colovesical fistula.   - pain control prn (minimize narcotics)  - advanced to full liquids diet, decreased maintenance IV fluids  - continue Foley catheter for bladder decompression x 3 days per urology   - medical management of comorbidities   - DVT prophylaxis, ambulation  - pathology pending  All of the above findings and recommendations were discussed with the patient and her RN, and all of patient's questions were answered to her expressed satisfaction.  -- Marilynne Drivers Rosana Hoes, MD, [place]: [facility] - Partnering for exceptional care. Office: [phone]  [facility] Progress Note  1 Day Post-Op  Subjective: POD1. She is doing well. She notes some "discomfort" but no pain. She is tolerating her liquid diet well without nausea or emesis. She does note that she passed flatus today. No complaints of fever, chills, CP, or SOB. Foley in place with good UO  Objective: Vital signs in last 24 hours: Temp:  [96.9 F (36.1 C)-98.5 F (36.9 C)] 98.5 F (36.9 C) (08/27 0250) Pulse Rate:  [71-96] 96 (08/27 0250) Resp:  [10-20] 20 (08/27 0250) BP: (140-186)/(73-95) 140/73 (08/27 0250) SpO2:  [92 %-99 %] 96 % (08/27 0250)    Intake/Output from previous day: 08/26 0701 - 08/27 0700 In: 2689.7 [P.O.:600; I.V.:1869.7; IV Piggyback:100] Out: 5093 [Urine:1525; Drains:130] Intake/Output this shift: No intake/output data recorded.  PE: Gen:  Alert, NAD, pleasant Card:  Regular rate and rhythm, pedal pulses 2+ BL Pulm:  Normal effort, clear to auscultation bilaterally Abd: Soft, non-tender, non-distended, bowel sounds present in all 4 quadrants, incisions C/D/I without erythema or  purulence GU: Foley in place MSK: No peripheral edema, no calf tenderness, SCDs in place Skin: warm and dry, no rashes  Psych: A&Ox3   Lab Results:  Recent Labs    02/13/18 2004 02/14/18 0344  WBC 14.9* 12.8*  HGB 14.2 12.9  HCT 43.8 38.1  PLT 238 256   BMET Recent Labs    02/13/18 2004 02/14/18 0344  NA  --  133*  K  --  3.9  CL  --  99  CO2  --  26  GLUCOSE  --  168*  BUN  --  8  CREATININE 0.53 0.73  CALCIUM  --  8.5*   PT/INR No results for input(s): LABPROT, INR in the last 72 hours. CMP     Component Value Date/Time   NA 133 (L) 02/14/2018 0344   NA 135 09/09/2015 0843   K 3.9 02/14/2018 0344   CL 99 02/14/2018 0344   CO2 26 02/14/2018 0344   GLUCOSE 168 (H) 02/14/2018 0344   BUN 8 02/14/2018 0344   BUN 12 09/09/2015 0843   CREATININE 0.73 02/14/2018 0344   CALCIUM 8.5 (L) 02/14/2018 0344   PROT 6.4 (L) 09/28/2017 1803   PROT 6.4 09/09/2015 0843   ALBUMIN 3.2 (L) 09/28/2017 1803   ALBUMIN 4.3 09/09/2015 0843   AST 27 09/28/2017 1803   ALT 16 09/28/2017 1803   ALKPHOS 92 09/28/2017 1803   BILITOT 1.0 09/28/2017 1803   BILITOT 1.0 09/09/2015 0843   GFRNONAA >60 02/14/2018 0344   GFRAA >60 02/14/2018 0344   Lipase     Component Value Date/Time   LIPASE 25 09/28/2017 1803       Studies/Results:  No results found.  Anti-infectives: Anti-infectives (From admission, onward)   Start     Dose/Rate Route Frequency Ordered Stop   02/13/18 0600  ertapenem (INVANZ) 1,000 mg in sodium chloride 0.9 % 100 mL IVPB     1 g 200 mL/hr over 30 Minutes Intravenous On call to O.R. 02/12/18 2145 02/13/18 1505       Assessment/Plan  Complicated Diverticulitis with Colovesical Fistula   - POD1. Doing well clinically with minimal discomfort, tolerating liquid diet, and passing flatus - Plan for now to remain on liquid diet today and she how she does, and reassess this afternoon and tomorrow.  - Continue IVF, pain control. Encouraged mobilization and IS  use.    LOS: 1 day    Edison Simon , PA-C [facility] 02/14/2018, 9:32 AM [phone] M-F: 7am - 4pm

## 2018-02-15 LAB — CBC
HCT: 31.1 % — ABNORMAL LOW (ref 35.0–47.0)
HEMOGLOBIN: 10.8 g/dL — AB (ref 12.0–16.0)
MCH: 30.1 pg (ref 26.0–34.0)
MCHC: 34.7 g/dL (ref 32.0–36.0)
MCV: 86.6 fL (ref 80.0–100.0)
Platelets: 191 10*3/uL (ref 150–440)
RBC: 3.59 MIL/uL — AB (ref 3.80–5.20)
RDW: 14.3 % (ref 11.5–14.5)
WBC: 9.1 10*3/uL (ref 3.6–11.0)

## 2018-02-15 LAB — SURGICAL PATHOLOGY

## 2018-02-15 NOTE — Progress Notes (Signed)
[facility] Progress Note  2 Days Post-Op  Subjective: POD2. Patient continues to do well this morning. She denied any complaints of abdominal pain. She has tolerated a full liquid diet yesterday without nausea or emesis. No fever or chills. +Flatus.   Objective: Vital signs in last 24 hours: Temp:  [97.6 F (36.4 C)-98.3 F (36.8 C)] 97.9 F (36.6 C) (08/28 0501) Pulse Rate:  [60-83] 61 (08/28 0801) Resp:  [16-20] 20 (08/28 0501) BP: (149-184)/(63-81) 184/63 (08/28 0801) SpO2:  [96 %-100 %] 99 % (08/28 0801)    Intake/Output from previous day: 08/27 0701 - 08/28 0700 In: 2608 [P.O.:1080; I.V.:1528] Out: 2780 [Urine:2675; Drains:105] Intake/Output this shift: No intake/output data recorded.  PE: Gen:  Alert, NAD, pleasant Abd: Soft, non-tender, non-distended, bowel sounds present in all 4 quadrants, incisions C/D/I without surrounding erythema. JP drain in suprapubic region with serosanguinous fluid in bulb.  GU: Foley present MSK: No peripheral edema, no calf tenderness Skin: warm and dry, no rashes  Psych: A&Ox3   Lab Results:  Recent Labs    02/14/18 0344 02/15/18 0444  WBC 12.8* 9.1  HGB 12.9 10.8*  HCT 38.1 31.1*  PLT 256 191   BMET Recent Labs    02/13/18 2004 02/14/18 0344  NA  --  133*  K  --  3.9  CL  --  99  CO2  --  26  GLUCOSE  --  168*  BUN  --  8  CREATININE 0.53 0.73  CALCIUM  --  8.5*   PT/INR No results for input(s): LABPROT, INR in the last 72 hours. CMP     Component Value Date/Time   NA 133 (L) 02/14/2018 0344   NA 135 09/09/2015 0843   K 3.9 02/14/2018 0344   CL 99 02/14/2018 0344   CO2 26 02/14/2018 0344   GLUCOSE 168 (H) 02/14/2018 0344   BUN 8 02/14/2018 0344   BUN 12 09/09/2015 0843   CREATININE 0.73 02/14/2018 0344   CALCIUM 8.5 (L) 02/14/2018 0344   PROT 6.4 (L) 09/28/2017 1803   PROT 6.4 09/09/2015 0843   ALBUMIN 3.2 (L) 09/28/2017 1803   ALBUMIN 4.3 09/09/2015 0843   AST 27 09/28/2017 1803   ALT 16 09/28/2017 1803   ALKPHOS 92 09/28/2017 1803   BILITOT 1.0 09/28/2017 1803   BILITOT 1.0 09/09/2015 0843   GFRNONAA >60 02/14/2018 0344   GFRAA >60 02/14/2018 0344   Lipase     Component Value Date/Time   LIPASE 25 09/28/2017 1803       Studies/Results: No results found.  Anti-infectives: Anti-infectives (From admission, onward)   Start     Dose/Rate Route Frequency Ordered Stop   02/13/18 0600  ertapenem (INVANZ) 1,000 mg in sodium chloride 0.9 % 100 mL IVPB     1 g 200 mL/hr over 30 Minutes Intravenous On call to O.R. 02/12/18 2145 02/13/18 1505       Assessment/Plan   Complicated Diverticulitis with Colovesical Fistula   - POD2, She continues to show clinical improvement.  - Will advance her to full diet this morning and reassess her this afternoon for possible discharge.  - IV Lock - Encouraged continued mobilization in the interim.  - Pain control as need    LOS: 2 days    Edison Simon , PA-C [facility] 02/15/2018, 8:44 AM [phone] M-F: 7am - 4pm

## 2018-02-15 NOTE — Discharge Summary (Addendum)
Discharge Summary  Patient ID: Barbara Hernandez MRN: [id number] DOB/AGE: 75-Sep-1942 75 y.o.  Admit date: 02/13/2018 Discharge date: 02/15/2018  Discharge Diagnoses Complicated Diverticulitis with Colovesical Fistula   Consultants Dr. Hollice Espy, MD (Urology)   Procedures Laparoscopic lysis of adhesions, Laparoscopic sigmoid colectomy, Laparoscopic taken of splenic flexure on 02/13/18 with Dr. Caroleen Hamman, MD and Dr. Hollice Espy, MD  HPI: Barbara Hernandez is a 77 y.o. female with a history of colovesical fistula followed by Dr. Allen Norris and Dr. Erlene Quan who presented for laparoscopic lysis of adhesions, sigmoid colectomy, and splenic flexure takedown on 02/13/18 with Dr. Dahlia Byes, MD and Dr. Erlene Quan, [place]: She underwent her laparoscopic procedure on 08/26 and did well in the post-operative period. She did not have significant post-operative pain and was passing flatus on POD1. Her diet was advanced over the next two days without nausea or emesis. On the day of discharge, she was tolerating a full diet, mobilizing well, and her pain was controlled.   She will follow up in 1 week for JP drain removal.     Allergies as of 02/15/2018      Reactions   Carvedilol Other (See Comments)   No energy, fatigued - higher doses   Hctz [hydrochlorothiazide] Other (See Comments)   Abnormal labs hyponatremia   Spironolactone Other (See Comments)   Hyponatremia      Medication List    STOP taking these medications   carboxymethylcellulose 0.5 % Soln Commonly known as:  REFRESH PLUS   ciprofloxacin 250 MG tablet Commonly known as:  CIPRO   erythromycin base 500 MG tablet Commonly known as:  E-MYCIN   fluconazole 150 MG tablet Commonly known as:  DIFLUCAN   Na Sulfate-K Sulfate-Mg Sulf 17.5-3.13-1.6 GM/177ML Soln   neomycin 500 MG tablet Commonly known as:  MYCIFRADIN     TAKE these medications   acetaminophen 500 MG tablet Commonly known as:  TYLENOL Take 500 mg by  mouth 2 (two) times daily as needed for mild pain.   aspirin EC 81 MG tablet Take 81 mg by mouth at bedtime.   CALCIUM 1200 PO Take 1 tablet by mouth daily.   calcium carbonate 500 MG chewable tablet Commonly known as:  TUMS - dosed in mg elemental calcium Chew 2 tablets by mouth daily as needed for indigestion or heartburn.   carvedilol 6.25 MG tablet Commonly known as:  COREG Take 6.25 mg by mouth 2 (two) times daily with a meal.   celecoxib 200 MG capsule Commonly known as:  CELEBREX Take 200 mg by mouth daily.   docusate sodium 100 MG capsule Commonly known as:  COLACE 1 tab 2 times a day while on narcotics.  STOOL SOFTENER What changed:    how much to take  how to take this  when to take this  additional instructions   GENTEAL TEARS OP Apply 1 application to eye at bedtime.   levothyroxine 125 MCG tablet Commonly known as:  SYNTHROID, LEVOTHROID Take 1 tablet (125 mcg total) by mouth daily.   lisinopril 40 MG tablet Commonly known as:  PRINIVIL,ZESTRIL Take 1 tablet (40 mg total) by mouth daily. bedtime What changed:  additional instructions   multivitamin with minerals Tabs tablet Take 1 tablet by mouth daily.   omeprazole 20 MG capsule Commonly known as:  PRILOSEC Take 1 capsule (20 mg total) by mouth daily.   polyethylene glycol packet Commonly known as:  MIRALAX / GLYCOLAX Take 17 g by mouth daily.  simvastatin 20 MG tablet Commonly known as:  ZOCOR Take 1 tablet (20 mg total) by mouth daily. bedtime   vitamin B-12 1000 MCG tablet Commonly known as:  CYANOCOBALAMIN Take 1,000 mcg by mouth daily.   Vitamin D3 2000 units Tabs Take 2,000 Units by mouth every evening.        Follow-up Information    Vickie Epley, MD. Schedule an appointment as soon as possible for a visit in 1 week(s).   Specialty:  General Surgery Contact information: [address] [place] [address] [phone]            Signed: Edison Simon , [facility] Cor[facility]  02/15/2018, 1:04 PM [phone] M-F: 7am - 4pm

## 2018-02-16 ENCOUNTER — Telehealth: Payer: Self-pay | Admitting: Surgery

## 2018-02-16 NOTE — Telephone Encounter (Signed)
Patient is calling and complaining of having some pain, patient said she is feeling a little nausea, she just has some questions about some pain medication she has at home asking about if she can take them. Please call patient and advise.

## 2018-02-16 NOTE — Telephone Encounter (Signed)
Patient was up and down a lot last night and has increased pain from this, especially from bending. She says that the Tylenol is not helping with that pain. She has Miralax for bowel movements and will continue on this until she has a bowel movement. She has some left over pain medication and Zofran from a previous surgery and she will try this to see if it helps. She is aware to call us with any further questions.

## 2018-02-17 ENCOUNTER — Telehealth: Payer: Self-pay

## 2018-02-17 NOTE — Telephone Encounter (Signed)
Patient had called last night (02/16/2018) at 9:05 PM wanting for [place] to call her back. However, I received a message until 12:00 PM today. I tried calling the patient back and the phone only rang with no voicemail allowed. I was hoping to speak with her since we will be back to the office until 02/20/2018. Hopefully patient could call the hospital for advise to speak with one of our surgeons on call. If not, I will reach out to her again on Tuesday.

## 2018-02-21 ENCOUNTER — Ambulatory Visit (INDEPENDENT_AMBULATORY_CARE_PROVIDER_SITE_OTHER): Payer: Medicare Other | Admitting: Surgery

## 2018-02-21 ENCOUNTER — Encounter: Payer: Self-pay | Admitting: Surgery

## 2018-02-21 VITALS — BP 158/82 | HR 80 | Temp 97.0°F | Wt 215.0 lb

## 2018-02-21 DIAGNOSIS — Z4889 Encounter for other specified surgical aftercare: Secondary | ICD-10-CM

## 2018-02-21 MED ORDER — TRAMADOL HCL 50 MG PO TABS: 50.0000 mg | ORAL_TABLET | ORAL | 0 refills | Status: DC | PRN

## 2018-02-21 MED ORDER — TRAMADOL HCL 50 MG PO TABS: 50.0000 mg | ORAL_TABLET | Freq: Four times a day (QID) | ORAL | 0 refills | Status: DC | PRN

## 2018-02-21 NOTE — Telephone Encounter (Signed)
Called patient today and was not able to leave her a voicemail. However, I saw that she was on the schedule today to see Dr. Rosana Hoes.

## 2018-02-21 NOTE — Patient Instructions (Signed)
Please make sure that you drink plenty of water.  Please start taking Colace (stool softeners) one tablet once a day.  GENERAL POST-OPERATIVE PATIENT INSTRUCTIONS   WOUND CARE INSTRUCTIONS:  Keep a dry clean dressing on the wound if there is drainage. The initial bandage may be removed after 24 hours.  Once the wound has quit draining you may leave it open to air.  If clothing rubs against the wound or causes irritation and the wound is not draining you may cover it with a dry dressing during the daytime.  Try to keep the wound dry and avoid ointments on the wound unless directed to do so.  If the wound becomes bright red and painful or starts to drain infected material that is not clear, please contact your physician immediately.  If the wound is mildly pink and has a thick firm ridge underneath it, this is normal, and is referred to as a healing ridge.  This will resolve over the next 4-6 weeks.  BATHING: You may shower if you have been informed of this by your surgeon. However, Please do not submerge in a tub, hot tub, or pool until incisions are completely sealed or have been told by your surgeon that you may do so.  DIET:  You may eat any foods that you can tolerate.  It is a good idea to eat a high fiber diet and take in plenty of fluids to prevent constipation.  If you do become constipated you may want to take a mild laxative or take ducolax tablets on a daily basis until your bowel habits are regular.  Constipation can be very uncomfortable, along with straining, after recent surgery.  ACTIVITY:  You are encouraged to cough and deep breath or use your incentive spirometer if you were given one, every 15-30 minutes when awake.  This will help prevent respiratory complications and low grade fevers post-operatively if you had a general anesthetic.  You may want to hug a pillow when coughing and sneezing to add additional support to the surgical area, if you had abdominal or chest surgery, which  will decrease pain during these times.  You are encouraged to walk and engage in light activity for the next two weeks.  You should not lift more than 20 pounds, until 03/27/2018 as it could put you at increased risk for complications.  Twenty pounds is roughly equivalent to a plastic bag of groceries. At that time- Listen to your body when lifting, if you have pain when lifting, stop and then try again in a few days. Soreness after doing exercises or activities of daily living is normal as you get back in to your normal routine.  MEDICATIONS:  Try to take narcotic medications and anti-inflammatory medications, such as tylenol, ibuprofen, naprosyn, etc., with food.  This will minimize stomach upset from the medication.  Should you develop nausea and vomiting from the pain medication, or develop a rash, please discontinue the medication and contact your physician.  You should not drive, make important decisions, or operate machinery when taking narcotic pain medication.  SUNBLOCK Use sun block to incision area over the next year if this area will be exposed to sun. This helps decrease scarring and will allow you avoid a permanent darkened area over your incision.  QUESTIONS:  Please feel free to call our office if you have any questions, and we will be glad to assist you. [phone]

## 2018-02-21 NOTE — Progress Notes (Signed)
Surgical Clinic Progress/Follow-up Note   HPI:  77 y.o. 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 Patient reports post-surgical pain at her peri-umbilical mini-laparotomy incision via which specimen was retrieved and her RLQ port site via which endostapler was introduced and utilized has been slowly improving. She describes she continues to have limited appetite and says oxycodone makes her nauseous and confused, while Tylenol and ibuprofen are inadequate for her pain. She has been otherwise tolerating all that she eats/drinks, and she treated her initial post-surgical constipation with Miralax. She reports +flatus and denies N/V, fever/chills, CP, or SOB.  Review of Systems:  Constitutional: denies fever/chills  Respiratory: denies shortness of breath, wheezing  Cardiovascular: denies chest pain, palpitations  Gastrointestinal: abdominal pain, N/V, and bowel function as per interval history Skin: Denies any other rashes or skin discolorations except post-surgical wounds as per interval history  Vital Signs:  BP (!) 158/82   Pulse 80   Temp (!) 97 F (36.1 C) (Oral)   Wt 215 lb (97.5 kg)   SpO2 97% Comment: room air  BMI 39.32 kg/m    Physical Exam:  Constitutional:  -- Obese body habitus  -- Awake, alert, and oriented x3  Pulmonary:  -- No crackles -- Equal breath sounds bilaterally -- Breathing non-labored at rest Cardiovascular:  -- S1, S2 present  -- No pericardial rubs  Gastrointestinal:  -- Soft and non-distended, minimal umbilical and RLQ peri-incisional tenderness to palpation, no guarding/rebound tenderness -- Post-surgical incisions all well-approximated without any peri-incisional erythema or drainage -- No abdominal masses appreciated, pulsatile or otherwise  Musculoskeletal / Integumentary:  -- Wounds or skin discoloration:  None appreciated except post-surgical incisions as described above (GI) -- Extremities: B/L UE and LE FROM, hands and feet warm, no edema   Assessment:  77 y.o. yo Female with a problem list including...  Patient Active Problem List   Diagnosis Date Noted  . S/P laparoscopic colectomy 02/13/2018  . Recto-bladder neck fistula   . Fistula of intestine, excluding rectum and anus   . Preop examination   . Obesity, unspecified 10/13/2017  . Perforation of sigmoid colon due to diverticulitis 09/26/2017  . Primary localized osteoarthritis of left knee 09/06/2017  . Benign essential hypertension 03/18/2015  . 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 Arthritis, degenerative 11/22/2014  . Hypothyroidism 10/08/2014  . Barrett's esophagus 10/08/2014  . Hypercholesteremia 10/08/2014  . Hypertension 10/08/2014  . Hematuria 10/08/2014  . Mild left ventricular hypertrophy 10/08/2014  . Pernicious anemia 10/08/2014  . Vitamin D deficiency 10/08/2014  . Vertigo, benign paroxysmal 10/08/2014  . Systolic murmur 16/03/9603  . Sleep apnea 10/08/2014  . Arthritis of shoulder region, right, degenerative 10/08/2014  . Allergic rhinitis 10/08/2014  . 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 MI (mitral incompetence) 08/14/2014  . Esophageal reflux 09/07/2012  . Hemorrhoid     presents to clinic for post-op follow-up evaluation, doing overall well 9 Days s/p laparoscope-assisted sigmoid colectomy (Pabon, 02/13/2018) for diverticular disease with colovesicular fistula.  Plan:   - discussed pathology             - advance diet as tolerated  - prescription for Tramadol as bridge from narcotics to Tylenol/NSAIDs             - okay in 1 more week to submerge incisions under water (baths, swimming) prn             - gradually resume all  activities with no heavy lifting x 1 more week and then nothing >40 lbs x 4 weeks             - return to clinic in 1 week, instructed to call office if any questions or concerns  All of  the above recommendations were discussed with the patient and patient's family, and all of patient's and family's questions were answered to their expressed satisfaction.  -- Marilynne Drivers Rosana Hoes, MD, [place]: [facility] - Partnering for exceptional care. Office: [phone]

## 2018-02-22 ENCOUNTER — Encounter: Payer: Self-pay | Admitting: Surgery

## 2018-02-23 ENCOUNTER — Telehealth: Payer: Self-pay

## 2018-02-23 ENCOUNTER — Ambulatory Visit: Payer: Medicare Other | Admitting: Surgery

## 2018-02-23 ENCOUNTER — Encounter: Payer: Self-pay | Admitting: Surgery

## 2018-02-23 VITALS — BP 166/128 | HR 62 | Temp 97.0°F | Resp 14 | Ht 67.0 in | Wt 207.0 lb

## 2018-02-23 DIAGNOSIS — T8149XA Infection following a procedure, other surgical site, initial encounter: Secondary | ICD-10-CM

## 2018-02-23 MED ORDER — METRONIDAZOLE 500 MG PO TABS: 500.0000 mg | ORAL_TABLET | Freq: Two times a day (BID) | ORAL | 0 refills | Status: AC

## 2018-02-23 MED ORDER — CIPROFLOXACIN HCL 500 MG PO TABS: 500.0000 mg | ORAL_TABLET | Freq: Two times a day (BID) | ORAL | 0 refills | Status: AC

## 2018-02-23 NOTE — Telephone Encounter (Signed)
The nurse from [facility] called stating that she went to visit the patient today for a wound check and dressing change and saw that the patient was dehiscing from her incision site. She was told by the patient that since she had her staples removed, her incision site opened up and started to drain. Patient declined having a fever, chills, nausea, vomiting Therefore, I recommended for the patient to come in to be seen by one of our providers. Patient will be seen by Dr. Rosana Hoes at 1:30 PM today. The patient agreed on coming in and had no further questions.

## 2018-02-23 NOTE — Patient Instructions (Addendum)
Return in one week. Rx sent . Patient to take Cipro 3 times daily and flagyl 2 times a day for seven days.

## 2018-02-26 NOTE — Progress Notes (Signed)
Surgical Clinic Progress/Follow-up Note   HPI:  77 y.o. 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 Patient otherwise reports improving appetite and has been tolerating regular diet with +flatus and normal BM's, denies significant abdominal pain, N/V, fever/chills, CP, or SOB.  Review of Systems:  Constitutional: denies fever/chills  Respiratory: denies shortness of breath, wheezing  Cardiovascular: denies chest pain, palpitations  Gastrointestinal: abdominal pain, N/V, and bowel function as per interval history Skin: Denies any other rashes or skin discolorations except post-surgical wounds as per interval history  Vital Signs:  BP (!) 166/128   Pulse 62   Temp (!) 97 F (36.1 C) (Oral)   Resp 14   Ht 5\' 7"  (1.702 m)   Wt 207 lb (93.9 kg)   BMI 32.42 kg/m    Physical Exam:  Constitutional:  -- Normal  -- Awake, alert, and oriented x3  Pulmonary:  -- No crackles -- Equal breath sounds bilaterally -- Breathing non-labored at rest Cardiovascular:  -- S1, S2 present  -- No pericardial rubs  Gastrointestinal:  -- Soft and non-distended, minimal mid-abdominal/peri-umbilical peri-incisional tenderness to palpation, no guarding/rebound tenderness -- Post-surgical incisions all well-approximated except small central peri-umbilical focus draining tan purulent fluid without much erythema -- No abdominal masses appreciated, pulsatile or otherwise  Musculoskeletal / Integumentary:  -- Wounds or skin discoloration: None appreciated except post-surgical incisions as described above (GI) -- Extremities: B/L UE and LE FROM, hands and feet warm, no edema   Assessment:  77 y.o. yo Female with a problem list  including...  Patient Active Problem List   Diagnosis Date Noted  . S/P laparoscopic colectomy 02/13/2018  . Recto-bladder neck fistula   . Fistula of intestine, excluding rectum and anus   . Preop examination   . Obesity, unspecified 10/13/2017  . Perforation of sigmoid colon due to diverticulitis 09/26/2017  . Primary localized osteoarthritis of left knee 09/06/2017  . Benign essential hypertension 03/18/2015  . 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 Arthritis, degenerative 11/22/2014  . Hypothyroidism 10/08/2014  . Barrett's esophagus 10/08/2014  . Hypercholesteremia 10/08/2014  . Hypertension 10/08/2014  . Hematuria 10/08/2014  . Mild left ventricular hypertrophy 10/08/2014  . Pernicious anemia 10/08/2014  . Vitamin D deficiency 10/08/2014  . Vertigo, benign paroxysmal 10/08/2014  . Systolic murmur 44/06/270  . Sleep apnea 10/08/2014  . Arthritis of shoulder region, right, degenerative 10/08/2014  . Allergic rhinitis 10/08/2014  . 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 MI (mitral incompetence) 08/14/2014  . Esophageal reflux 09/07/2012  . Hemorrhoid     presents to clinic for purulent drainage from mid-abdominal mini-laparotomy/specimen removal incision site 10 days s/p s/p laparoscope-assisted sigmoid colectomy with primary anastomosis (Pabon, 02/13/2018) for diverticular disease with colovesicular fistula.  Plan:              - diet as tolerated              - deep cultures obtained with clean technique  - incision gently probed with 15 mL additional pus expressed  - will start Cipro/Flagyl, may need to adjust pending cultures to follow             - okay to shower, lifting and activity restrictions as previously advised             - instructed to call  office if any questions or concerns  - return to clinic in 1 week  All of the above recommendations were discussed with the patient and patient's family, and all of patient's and family's questions were answered to their  expressed satisfaction.  -- Marilynne Drivers Rosana Hoes, MD, [place]: [facility] - Partnering for exceptional care. Office: [phone]

## 2018-02-27 ENCOUNTER — Telehealth: Payer: Self-pay

## 2018-02-27 LAB — ANAEROBIC AND AEROBIC CULTURE

## 2018-02-27 MED ORDER — SULFAMETHOXAZOLE-TRIMETHOPRIM 800-160 MG PO TABS: 1.0000 | ORAL_TABLET | Freq: Two times a day (BID) | ORAL | 0 refills | Status: DC

## 2018-02-27 MED ORDER — AMOXICILLIN-POT CLAVULANATE 875-125 MG PO TABS: 1.0000 | ORAL_TABLET | Freq: Two times a day (BID) | ORAL | 0 refills | Status: DC

## 2018-02-27 NOTE — Telephone Encounter (Signed)
Called patient to let her know that her culture came back and Dr. Rosana Hoes wanted her to stop the previous antibiotics that were prescribed and to start the new ones today. Told patient that I had sent her prescriptions to her pharmacy so to start them today. Patient understood and had no further questions. Patient's appointment was rescheduled to come in on 03/01/2018 to see her surgeon. Patient agreed.

## 2018-02-27 NOTE — Telephone Encounter (Signed)
-----   Message from Vickie Epley, MD sent at 02/26/2018 12:55 PM EDT ----- Regarding: Antibiotics need to be changed for this patient Hi,  I hope you've had/been having a good weekend.  Regarding this patient of Dr. Corlis Leak, who's post-surgical wound developed an abscess that I drained the end of last week, her cultures returned today with pseudomonas and MRSA not sensitive to the Cipro/Flagyl I prescribed, anticipating more predominantly gram negative rods s/p laparoscope-assisted sigmoid colectomy.  Her antibiotics should be changed to Augmentin + Bactrim x 7 days. Unfortunately, no single antibiotic covers her MRSA + gram negative rods and pseudomonas. Please let me know if any questions.  Thank you.          Corene Cornea

## 2018-03-01 ENCOUNTER — Encounter: Payer: Self-pay | Admitting: Surgery

## 2018-03-01 ENCOUNTER — Ambulatory Visit (INDEPENDENT_AMBULATORY_CARE_PROVIDER_SITE_OTHER): Payer: Medicare Other | Admitting: Surgery

## 2018-03-01 VITALS — BP 157/104 | HR 56 | Temp 97.8°F | Wt 214.0 lb

## 2018-03-01 DIAGNOSIS — Z09 Encounter for follow-up examination after completed treatment for conditions other than malignant neoplasm: Secondary | ICD-10-CM

## 2018-03-01 NOTE — Progress Notes (Signed)
S/p lap sigmoid for colovesical fistula Developed superficial infection that responded appropriately to A/Bs Doing well, drain removed vy Dr. Rosana Hoes last week. No fevers Taking PO, + bm   PE NAD Abd: soft, nt, incision healing well, small opening around umbilicus w scant serous drainage, no peritonitis or abscess.   A/p Doing very well No heavy lifting RTC prn

## 2018-03-01 NOTE — Patient Instructions (Signed)
Please give us a call in case you have any questions or concerns.  

## 2018-03-02 ENCOUNTER — Encounter: Payer: Self-pay | Admitting: Surgery

## 2018-03-02 ENCOUNTER — Other Ambulatory Visit: Payer: Self-pay | Admitting: Family Medicine

## 2018-03-02 DIAGNOSIS — Z1231 Encounter for screening mammogram for malignant neoplasm of breast: Secondary | ICD-10-CM

## 2018-03-08 ENCOUNTER — Telehealth: Payer: Self-pay | Admitting: Surgery

## 2018-03-08 NOTE — Telephone Encounter (Signed)
Barbara Hernandez with [facility] called and said the patient's culture where is was removed, was is draining a yellowish tan color, and the patient also just finished her antibiotic. Barbara Hernandez is asking if someone would give her a call back at [phone]. Please call and advise.

## 2018-03-09 ENCOUNTER — Ambulatory Visit (INDEPENDENT_AMBULATORY_CARE_PROVIDER_SITE_OTHER): Payer: Medicare Other | Admitting: Surgery

## 2018-03-09 ENCOUNTER — Encounter: Payer: Self-pay | Admitting: Surgery

## 2018-03-09 VITALS — BP 172/76 | HR 68 | Temp 97.8°F | Wt 215.0 lb

## 2018-03-09 DIAGNOSIS — Z4889 Encounter for other specified surgical aftercare: Secondary | ICD-10-CM

## 2018-03-09 MED ORDER — FLUCONAZOLE 100 MG PO TABS: 100.0000 mg | ORAL_TABLET | Freq: Once | ORAL | 0 refills | Status: AC

## 2018-03-09 NOTE — Progress Notes (Signed)
Surgical Clinic Progress/Follow-up Note   HPI:  77 y.o. Female presents to clinic for subsequent post-op follow-up. She reports essentially resolution of prior mid-central abdominal wound drainage without any further peri-incisional pain, redness, or purulent drainage. She presents for follow-up upon recommendation of visiting RN for small focus of mid-central peri-incisional skin necrosis +/- small amount of overlying fibrinous exudate. Patient otherwise describes +flatus, +BM's WNL, improved appetite, and no N/V or blood per rectum. She also denies CP or SOB.  Review of Systems:  Constitutional: denies any other weight loss, fever, chills, or sweats  Eyes: denies any other vision changes, history of eye injury  ENT: denies sore throat, hearing problems  Respiratory: denies shortness of breath, wheezing  Cardiovascular: denies chest pain, palpitations  Gastrointestinal: abdominal pain, N/V, and bowel function as per HPI Musculoskeletal: denies any other joint pains or cramps  Skin: Denies any other rashes or skin discolorations  Neurological: denies any other headache, dizziness, weakness  Psychiatric: denies any other depression, anxiety  All other review of systems: otherwise negative   Vital Signs:  BP (!) 172/76   Pulse 68   Temp 97.8 F (36.6 C) (Skin)   Wt 215 lb (97.5 kg)   BMI 33.67 kg/m    Physical Exam:  Constitutional:  -- Normal body habitus  -- Awake, alert, and oriented x3  Eyes:  -- Pupils equally round and reactive to light  -- No scleral icterus  Ear, nose, throat:  -- No jugular venous distension  -- No nasal drainage, bleeding Pulmonary:  -- No crackles -- Equal breath sounds bilaterally -- Breathing non-labored at rest Cardiovascular:  -- S1, S2 present  -- No pericardial rubs  Gastrointestinal:  -- Soft, nontender, non-distended, no guarding/rebound -- Post-surgical incision overall well-approximated except mid-central post-surgical  peri-incisional 7 - 8 mm focus of skin necrosis +/- small amount fibrinous exudate without surrounding erythema or purulent drainage, possible unappreciated underlying suture -- No other abdominal masses appreciated, pulsatile or otherwise  Musculoskeletal / Integumentary:  -- Wounds or skin discoloration: None appreciated except as described above (GI)  -- Extremities: B/L UE and LE FROM, hands and feet warm, no edema  Neurologic:  -- Motor function: intact and symmetric  -- Sensation: intact and symmetric   Assessment:  77 y.o. yo Female with a problem list including...  Patient Active Problem List   Diagnosis Date Noted  . S/P laparoscopic colectomy 02/13/2018  . Recto-bladder neck fistula   . Fistula of intestine, excluding rectum and anus   . Preop examination   . Obesity, unspecified 10/13/2017  . Perforation of sigmoid colon due to diverticulitis 09/26/2017  . Primary localized osteoarthritis of left knee 09/06/2017  . Benign essential hypertension 03/18/2015  . 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 Arthritis, degenerative 11/22/2014  . Hypothyroidism 10/08/2014  . Barrett's esophagus 10/08/2014  . Hypercholesteremia 10/08/2014  . Hypertension 10/08/2014  . Hematuria 10/08/2014  . Mild left ventricular hypertrophy 10/08/2014  . Pernicious anemia 10/08/2014  . Vitamin D deficiency 10/08/2014  . Vertigo, benign paroxysmal 10/08/2014  . Systolic murmur 55/73/2202  . Sleep apnea 10/08/2014  . Arthritis of shoulder region, right, degenerative 10/08/2014  . Allergic rhinitis 10/08/2014  . 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 MI (mitral incompetence) 08/14/2014  . Esophageal reflux 09/07/2012  . Hemorrhoid     presents to clinic for subsequent post-op follow-up evaluation, doing overall well with small focus of superficial skin necrosis with possible tiny amount of fibrinous exudate and possible not appreciated underlying suture without any  further evidence of abscess or cellulitis  nearly 1 month s/p laparoscope-assisted sigmoid colectomy with primary anastomosis (Pabon, 02/13/2018) for diverticular disease with colovesicular fistula..  Plan:   - completed prescribed antibiotics  - debridement of focal skin necrosis overlying former abscess without any appreciable purulent drainage or deep tissue infection or necrosis  - dry gauze dressing applied, okay to remove tomorrow and shower, do not submerge  - offered additional follow-up appointment with patient's operative surgeon, but patient expresses preference to follow up as needed, will continue evaluation by home RN x 4 more visits (per patient) and call if any concerns  - return to clinic as needed, instructed to call office if any questions or concerns  All of the above recommendations were discussed with the patient, and all of patient's questions were answered to her expressed satisfaction.  -- Marilynne Drivers Rosana Hoes, MD, [place]: [facility] - Partnering for exceptional care. Office: [phone]

## 2018-03-09 NOTE — Telephone Encounter (Signed)
Called Mickel Baas from [facility] and she stated that she went to see the patient yesterday for wound care and saw that her incision was draining yellowish tan color. She also stated that her incision site was red and angry. I asked if the patient had any fever or chills and she stated that she did not. I then told Mickel Baas that I would have to ask one of our physicians if she could be seen today just in case she needed another round of antibiotic. Mickel Baas agreed and had no further questions. Mickel Baas stated that if the patient needed additional wound care follow up, to fax her an order to [phone].

## 2018-03-09 NOTE — Telephone Encounter (Signed)
Called patient to let her know that I had spoken to Mickel Baas and that she recommended for her to come in today to be seen by Dr. Rosana Hoes so he could look at her wound. Patient agreed and had no further questions. Patient is coming in today at 2:30 PM.

## 2018-03-09 NOTE — Patient Instructions (Addendum)
Please give [place] a call in case you have any questions or concerns.   Your prescription was sent to your pharmacy.

## 2018-03-13 ENCOUNTER — Encounter: Payer: Self-pay | Admitting: Surgery

## 2018-03-22 ENCOUNTER — Ambulatory Visit (INDEPENDENT_AMBULATORY_CARE_PROVIDER_SITE_OTHER): Payer: Medicare Other | Admitting: Surgery

## 2018-03-22 ENCOUNTER — Encounter: Payer: Self-pay | Admitting: Surgery

## 2018-03-22 VITALS — BP 160/105 | HR 70 | Temp 97.5°F | Ht 67.0 in | Wt 217.0 lb

## 2018-03-22 DIAGNOSIS — Z09 Encounter for follow-up examination after completed treatment for conditions other than malignant neoplasm: Secondary | ICD-10-CM

## 2018-03-22 NOTE — Progress Notes (Signed)
S/p lap sigmoidectomy Doing well No complaints + PO , no fevers  PE NAD Abd: soft, nt. Incision healing well. No infection  A/ p Doing well No complications  RTC prn

## 2018-03-22 NOTE — Patient Instructions (Addendum)
Please continue to keep a dressing over the wound until there is no drainage.  You may wash the area with soap and water and pat dry.   Please follow up with Dr.Brandon.   Please call our if you have questions or concerns.

## 2018-03-29 ENCOUNTER — Encounter: Payer: Self-pay | Admitting: Gastroenterology

## 2018-03-29 ENCOUNTER — Other Ambulatory Visit: Payer: Self-pay

## 2018-03-29 ENCOUNTER — Ambulatory Visit (INDEPENDENT_AMBULATORY_CARE_PROVIDER_SITE_OTHER): Payer: Medicare Other | Admitting: Gastroenterology

## 2018-03-29 VITALS — BP 152/84 | HR 59 | Ht 67.0 in | Wt 218.0 lb

## 2018-03-29 DIAGNOSIS — K2270 Barrett's esophagus without dysplasia: Secondary | ICD-10-CM

## 2018-03-29 NOTE — Progress Notes (Signed)
Primary Care Physician: Hortencia Pilar, MD  Primary Gastroenterologist:  Dr. Lucilla Lame  Chief Complaint  Patient presents with  . Gastroesophageal Reflux    HPI: Barbara Hernandez is a 77 y.o. female here for follow-up of her Barrett's esophagus.  The patient was found to have short segment Barrett's at her last endoscopy done by Dr. Bary Castilla.  The patient also had seen me for a colonoscopy prior to sigmoidectomy for a colovesicular fistula.  Current Outpatient Medications  Medication Sig Dispense Refill  . Artificial Tear Solution (GENTEAL TEARS OP) Apply 1 application to eye at bedtime.    Marland Kitchen aspirin EC 81 MG tablet Take 81 mg by mouth at bedtime.     . Calcium Carbonate-Vit D-Min (CALCIUM 1200 PO) Take 1 tablet by mouth daily.     . carvedilol (COREG) 6.25 MG tablet Take 6.25 mg by mouth 2 (two) times daily with a meal.    . celecoxib (CELEBREX) 200 MG capsule Take 200 mg by mouth daily.     . Cholecalciferol (VITAMIN D3) 2000 UNITS TABS Take 2,000 Units by mouth every evening.     . docusate sodium (COLACE) 100 MG capsule 1 tab 2 times a day while on narcotics.  STOOL SOFTENER (Patient taking differently: Take 100 mg by mouth every evening. ) 60 capsule 0  . levothyroxine (SYNTHROID, LEVOTHROID) 125 MCG tablet Take 1 tablet (125 mcg total) by mouth daily. 30 tablet 3  . lisinopril (PRINIVIL,ZESTRIL) 40 MG tablet Take 1 tablet (40 mg total) by mouth daily. bedtime (Patient taking differently: Take 40 mg by mouth daily. ) 90 tablet 3  . Multiple Vitamin (MULTIVITAMIN WITH MINERALS) TABS tablet Take 1 tablet by mouth daily.    Marland Kitchen omeprazole (PRILOSEC) 20 MG capsule Take 1 capsule (20 mg total) by mouth daily. 90 capsule 3  . simvastatin (ZOCOR) 20 MG tablet Take 1 tablet (20 mg total) by mouth daily. bedtime 90 tablet 3  . vitamin B-12 (CYANOCOBALAMIN) 1000 MCG tablet Take 1,000 mcg by mouth daily.    Marland Kitchen acetaminophen (TYLENOL) 500 MG tablet Take 500 mg by mouth 2 (two) times daily as  needed for mild pain.     . calcium carbonate (TUMS - DOSED IN MG ELEMENTAL CALCIUM) 500 MG chewable tablet Chew 2 tablets by mouth daily as needed for indigestion or heartburn.     No current facility-administered medications for this visit.     Allergies as of 03/29/2018 - Review Complete 03/29/2018  Allergen Reaction Noted  . Carvedilol Other (See Comments) 08/30/2017  . Hctz [hydrochlorothiazide] Other (See Comments) 08/30/2017  . Spironolactone Other (See Comments) 12/01/2017    ROS:  General: Negative for anorexia, weight loss, fever, chills, fatigue, weakness. ENT: Negative for hoarseness, difficulty swallowing , nasal congestion. CV: Negative for chest pain, angina, palpitations, dyspnea on exertion, peripheral edema.  Respiratory: Negative for dyspnea at rest, dyspnea on exertion, cough, sputum, wheezing.  GI: See history of present illness. GU:  Negative for dysuria, hematuria, urinary incontinence, urinary frequency, nocturnal urination.  Endo: Negative for unusual weight change.    Physical Examination:   BP (!) 152/84   Pulse (!) 59   Ht 5\' 7"  (1.702 m)   Wt 218 lb (98.9 kg)   BMI 34.14 kg/m   General: Well-nourished, well-developed in no acute distress.  Eyes: No icterus. Conjunctivae pink. Mouth: Oropharyngeal mucosa moist and pink , no lesions erythema or exudate. Lungs: Clear to auscultation bilaterally. Non-labored. Heart: Regular rate and rhythm, no  murmurs rubs or gallops.  Abdomen: Bowel sounds are normal, nontender, nondistended, no hepatosplenomegaly or masses, no abdominal bruits or hernia , no rebound or guarding.   Extremities: No lower extremity edema. No clubbing or deformities. Neuro: Alert and oriented x 3.  Grossly intact. Skin: Warm and dry, no jaundice.   Psych: Alert and cooperative, normal mood and affect.  Labs:    Imaging Studies: No results found.  Assessment and Plan:   Barbara Hernandez is a 77 y.o. y/o female who has a history  of short segment Barrett's esophagus with her most recent biopsies back in 2017 showing normal tissue without any sign of Barrett's although the photographs taken at the time of the procedure appeared to show Barrett's esophagus.  The patient has been explained this and will be set up for an EGD for delineation of the extent of the Barrett's esophagus and further biopsies. I have discussed risks & benefits which include, but are not limited to, bleeding, infection, perforation & drug reaction.  The patient agrees with this plan & written consent will be obtained.       Lucilla Lame, MD. Marval Regal   Note: This dictation was prepared with Dragon dictation along with smaller phrase technology. Any transcriptional errors that result from this process are unintentional.

## 2018-05-08 ENCOUNTER — Other Ambulatory Visit: Payer: Self-pay

## 2018-05-08 ENCOUNTER — Encounter: Payer: Self-pay | Admitting: *Deleted

## 2018-05-10 NOTE — Discharge Instructions (Signed)
General Anesthesia, Adult, Care After °These instructions provide you with information about caring for yourself after your procedure. Your health care provider may also give you more specific instructions. Your treatment has been planned according to current medical practices, but problems sometimes occur. Call your health care provider if you have any problems or questions after your procedure. °What can I expect after the procedure? °After the procedure, it is common to have: °· Vomiting. °· A sore throat. °· Mental slowness. ° °It is common to feel: °· Nauseous. °· Cold or shivery. °· Sleepy. °· Tired. °· Sore or achy, even in parts of your body where you did not have surgery. ° °Follow these instructions at home: °For at least 24 hours after the procedure: °· Do not: °? Participate in activities where you could fall or become injured. °? Drive. °? Use heavy machinery. °? Drink alcohol. °? Take sleeping pills or medicines that cause drowsiness. °? Make important decisions or sign legal documents. °? Take care of children on your own. °· Rest. °Eating and drinking °· If you vomit, drink water, juice, or soup when you can drink without vomiting. °· Drink enough fluid to keep your urine clear or pale yellow. °· Make sure you have little or no nausea before eating solid foods. °· Follow the diet recommended by your health care provider. °General instructions °· Have a responsible adult stay with you until you are awake and alert. °· Return to your normal activities as told by your health care provider. Ask your health care provider what activities are safe for you. °· Take over-the-counter and prescription medicines only as told by your health care provider. °· If you smoke, do not smoke without supervision. °· Keep all follow-up visits as told by your health care provider. This is important. °Contact a health care provider if: °· You continue to have nausea or vomiting at home, and medicines are not helpful. °· You  cannot drink fluids or start eating again. °· You cannot urinate after 8-12 hours. °· You develop a skin rash. °· You have fever. °· You have increasing redness at the site of your procedure. °Get help right away if: °· You have difficulty breathing. °· You have chest pain. °· You have unexpected bleeding. °· You feel that you are having a life-threatening or urgent problem. °This information is not intended to replace advice given to you by your health care provider. Make sure you discuss any questions you have with your health care provider. °Document Released: 09/13/2000 Document Revised: 11/10/2015 Document Reviewed: 05/22/2015 °Elsevier Interactive Patient Education © 2018 [employer] ° °

## 2018-05-11 ENCOUNTER — Ambulatory Visit: Payer: Medicare Other | Admitting: Anesthesiology

## 2018-05-11 ENCOUNTER — Encounter
Admission: RE | Disposition: A | Discharge status: Home / Self Care | Payer: Self-pay | Source: Ambulatory Visit | Attending: Gastroenterology

## 2018-05-11 ENCOUNTER — Ambulatory Visit
Admission: RE | Admit: 2018-05-11 | Discharge: 2018-05-11 | Disposition: A | Discharge status: Home / Self Care | Payer: Medicare Other | Source: Ambulatory Visit | Attending: Gastroenterology | Admitting: Gastroenterology

## 2018-05-11 DIAGNOSIS — Z79899 Other long term (current) drug therapy: Secondary | ICD-10-CM | POA: Insufficient documentation

## 2018-05-11 DIAGNOSIS — Z87891 Personal history of nicotine dependence: Secondary | ICD-10-CM | POA: Insufficient documentation

## 2018-05-11 DIAGNOSIS — Z8249 Family history of ischemic heart disease and other diseases of the circulatory system: Secondary | ICD-10-CM | POA: Insufficient documentation

## 2018-05-11 DIAGNOSIS — E669 Obesity, unspecified: Secondary | ICD-10-CM | POA: Insufficient documentation

## 2018-05-11 DIAGNOSIS — I739 Peripheral vascular disease, unspecified: Secondary | ICD-10-CM | POA: Diagnosis not present

## 2018-05-11 DIAGNOSIS — G4730 Sleep apnea, unspecified: Secondary | ICD-10-CM | POA: Insufficient documentation

## 2018-05-11 DIAGNOSIS — Z79890 Hormone replacement therapy (postmenopausal): Secondary | ICD-10-CM | POA: Insufficient documentation

## 2018-05-11 DIAGNOSIS — K219 Gastro-esophageal reflux disease without esophagitis: Secondary | ICD-10-CM | POA: Diagnosis not present

## 2018-05-11 DIAGNOSIS — Z8551 Personal history of malignant neoplasm of bladder: Secondary | ICD-10-CM | POA: Insufficient documentation

## 2018-05-11 DIAGNOSIS — Z888 Allergy status to other drugs, medicaments and biological substances status: Secondary | ICD-10-CM | POA: Diagnosis not present

## 2018-05-11 DIAGNOSIS — Z7982 Long term (current) use of aspirin: Secondary | ICD-10-CM | POA: Diagnosis not present

## 2018-05-11 DIAGNOSIS — Z791 Long term (current) use of non-steroidal anti-inflammatories (NSAID): Secondary | ICD-10-CM | POA: Diagnosis not present

## 2018-05-11 DIAGNOSIS — I2510 Atherosclerotic heart disease of native coronary artery without angina pectoris: Secondary | ICD-10-CM | POA: Insufficient documentation

## 2018-05-11 DIAGNOSIS — K2270 Barrett's esophagus without dysplasia: Secondary | ICD-10-CM | POA: Diagnosis present

## 2018-05-11 DIAGNOSIS — Z6834 Body mass index (BMI) 34.0-34.9, adult: Secondary | ICD-10-CM | POA: Insufficient documentation

## 2018-05-11 DIAGNOSIS — I10 Essential (primary) hypertension: Secondary | ICD-10-CM | POA: Diagnosis not present

## 2018-05-11 DIAGNOSIS — E785 Hyperlipidemia, unspecified: Secondary | ICD-10-CM | POA: Insufficient documentation

## 2018-05-11 DIAGNOSIS — E039 Hypothyroidism, unspecified: Secondary | ICD-10-CM | POA: Diagnosis not present

## 2018-05-11 DIAGNOSIS — M1990 Unspecified osteoarthritis, unspecified site: Secondary | ICD-10-CM | POA: Insufficient documentation

## 2018-05-11 HISTORY — PX: ESOPHAGOGASTRODUODENOSCOPY (EGD) WITH PROPOFOL: SHX5813

## 2018-05-11 SURGERY — ESOPHAGOGASTRODUODENOSCOPY (EGD) WITH PROPOFOL
Anesthesia: General

## 2018-05-11 MED ORDER — SODIUM CHLORIDE 0.9 % IV SOLN: INTRAVENOUS | Status: DC

## 2018-05-11 MED ORDER — LACTATED RINGERS IV SOLN
10.0000 mL/h | INTRAVENOUS | Status: DC
  Administered 2018-05-11: 10 mL/h via INTRAVENOUS

## 2018-05-11 MED ORDER — PROPOFOL 10 MG/ML IV BOLUS
INTRAVENOUS | Status: DC | PRN
  Administered 2018-05-11: 100 mg via INTRAVENOUS

## 2018-05-11 MED ORDER — ONDANSETRON HCL 4 MG/2ML IJ SOLN: 4.0000 mg | Freq: Once | INTRAMUSCULAR | Status: DC | PRN

## 2018-05-11 MED ORDER — LIDOCAINE HCL (CARDIAC) PF 100 MG/5ML IV SOSY
PREFILLED_SYRINGE | INTRAVENOUS | Status: DC | PRN
  Administered 2018-05-11: 40 mg via INTRAVENOUS
  Administered 2018-05-11: 30 mg via INTRAVENOUS

## 2018-05-11 MED ORDER — GLYCOPYRROLATE 0.2 MG/ML IJ SOLN
INTRAMUSCULAR | Status: DC | PRN
  Administered 2018-05-11: 0.1 mg via INTRAVENOUS

## 2018-05-11 SURGICAL SUPPLY — 14 items
BLOCK BITE 60FR ADLT L/F GRN (MISCELLANEOUS) ×2 IMPLANT
CANISTER SUCT 1200ML W/VALVE (MISCELLANEOUS) ×2 IMPLANT
FCP ESCP3.2XJMB 240X2.8X (MISCELLANEOUS) ×1
FORCEPS BIOP RJ4 240 W/NDL (MISCELLANEOUS) ×2
FORCEPS ESCP3.2XJMB 240X2.8X (MISCELLANEOUS) IMPLANT
GOWN CVR UNV OPN BCK APRN NK (MISCELLANEOUS) ×2 IMPLANT
GOWN ISOL THUMB LOOP REG UNIV (MISCELLANEOUS) ×4
INJECTOR VARIJECT VIN23 (MISCELLANEOUS) IMPLANT
KIT DEFENDO VALVE AND CONN (KITS) IMPLANT
KIT ENDO PROCEDURE OLY (KITS) ×2 IMPLANT
SNARE SHORT THROW 13M SML OVAL (MISCELLANEOUS) IMPLANT
SNARE SHORT THROW 30M LRG OVAL (MISCELLANEOUS) IMPLANT
VARIJECT INJECTOR VIN23 (MISCELLANEOUS)
WATER STERILE IRR 250ML POUR (IV SOLUTION) ×2 IMPLANT

## 2018-05-11 NOTE — H&P (Signed)
Barbara Lame, MD [address] [place], [place] [address] Phone:[phone] Fax : [phone]  Primary Care Physician:  Hortencia Pilar, MD Primary Gastroenterologist:  Dr. Allen Norris  Pre-Procedure History & Physical: HPI:  Barbara Hernandez is a 77 y.o. female is here for an endoscopy.   Past Medical History:  Diagnosis Date  . Anemia   . Arthritis   . Barrett's esophagus 2012   2010 upper endoscopy showed only reflux. 2012 biopsies suggested Barrett's epithelial changes.  . Bladder tumor   . BPPV (benign paroxysmal positional vertigo) 2004  . Cancer ([place]) 01/2018   bladder with low potential malignancy. tumor removed and did not require further treatment  . 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 Coronary artery disease 01/2018   sees Dr. Nehemiah Massed  . Family history of adverse reaction to anesthesia    sister had memory problems after delivery of child  . GERD (gastroesophageal reflux disease)   . H/O measles   . Heart murmur   . Hematuria   . Hematuria   . Hemorrhoid   . Hemorrhoids   . History of chicken pox   . Hyperlipidemia   . Hypertension   . Hypothyroidism   . Joint pain   . Low sodium levels   . Mild left ventricular hypertrophy   . Onychomycosis   . Perforation of sigmoid colon due to diverticulitis 09/26/2017  . Primary localized osteoarthritis of left knee 09/06/2017  . Reflux   . Reflux esophagitis   . 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 Sleep apnea 2011   Uses C-Pap machine  . Thyroid disease   . Wears dentures    full upper and lower    Past Surgical History:  Procedure Laterality Date  . BLEPHAROPLASTY Bilateral 2015  . BREAST EXCISIONAL BIOPSY Left 1988   benign  . COLONOSCOPY W/ BIOPSIES N/A 01/16/2013   No source for GI blood loss, mild lymphatic prominence in the rectum.  . COLONOSCOPY WITH PROPOFOL N/A 01/20/2018   Procedure: COLONOSCOPY WITH PROPOFOL;  Surgeon: Barbara Lame, MD;   Location: [facility];  Service: Endoscopy;  Laterality: N/A;  sleep apnea  . ESOPHAGOGASTRODUODENOSCOPY (EGD) WITH PROPOFOL N/A 03/15/2016   Procedure: ESOPHAGOGASTRODUODENOSCOPY (EGD) WITH PROPOFOL;  Surgeon: Robert Bellow, MD;  Location: [facility];  Service: Endoscopy;  Laterality: N/A;  . EYE SURGERY Bilateral 2006   cataract  . LAPAROSCOPIC SIGMOID COLECTOMY N/A 02/13/2018   Procedure: LAPAROSCOPIC SIGMOID COLECTOMY;  Surgeon: Jules Husbands, MD;  Location: [facility];  Service: General;  Laterality: N/A;  . Left wrist fracture Left 1980   pins removed  . POPLITEAL SYNOVIAL CYST EXCISION Right   . TOTAL KNEE ARTHROPLASTY Right 08/09/2016   Procedure: TOTAL KNEE ARTHROPLASTY;  Surgeon: Elsie Saas, MD;  Location: [facility];  Service: Orthopedics;  Laterality: Right;  . TOTAL KNEE ARTHROPLASTY Left 09/19/2017   Procedure: TOTAL KNEE ARTHROPLASTY;  Surgeon: Elsie Saas, MD;  Location: [facility];  Service: Orthopedics;  Laterality: Left;  . TRANSURETHRAL RESECTION OF BLADDER TUMOR N/A 11/20/2014   Procedure: TRANSURETHRAL RESECTION OF BLADDER TUMOR (TURBT);  Surgeon: Hollice Espy, MD;  Location: [facility];  Service: Urology;  Laterality: N/A;  . UPPER GI ENDOSCOPY  2012, 2014    Prior to Admission medications   Medication Sig Start Date End Date Taking? Authorizing Provider  Artificial Tear Solution (GENTEAL TEARS OP) Apply 1 application to eye at bedtime.   Yes [provider]  aspirin EC 81 MG tablet Take 81 mg by mouth at bedtime.    Yes [provider]  Calcium Carbonate-Vit D-Min (CALCIUM 1200 PO) Take 1 tablet by mouth daily.    Yes [provider]  carvedilol (COREG) 6.25 MG tablet Take 6.25 mg by mouth 2 (two) times daily with a meal.   Yes [provider]  celecoxib (CELEBREX) 200 MG capsule Take 200 mg by mouth daily.    Yes [provider]  Cholecalciferol (VITAMIN D3) 2000 UNITS TABS Take 2,000 Units by mouth every evening.     Yes [provider]  docusate sodium (COLACE) 100 MG capsule 1 tab 2 times a day while on narcotics.  STOOL SOFTENER Patient taking differently: Take 100 mg by mouth every evening.  09/20/17  Yes Shepperson, Kirstin, PA-C  levothyroxine (SYNTHROID, LEVOTHROID) 125 MCG tablet Take 1 tablet (125 mcg total) by mouth daily. 09/11/15  Yes Margarita Rana, MD  lisinopril (PRINIVIL,ZESTRIL) 40 MG tablet Take 1 tablet (40 mg total) by mouth daily. bedtime Patient taking differently: Take 40 mg by mouth daily.  05/13/15  Yes Margarita Rana, MD  Multiple Vitamin (MULTIVITAMIN WITH MINERALS) TABS tablet Take 1 tablet by mouth daily.   Yes [provider]  omeprazole (PRILOSEC) 20 MG capsule Take 1 capsule (20 mg total) by mouth daily. 04/15/15  Yes Margarita Rana, MD  simvastatin (ZOCOR) 20 MG tablet Take 1 tablet (20 mg total) by mouth daily. bedtime 05/13/15  Yes Margarita Rana, MD  vitamin B-12 (CYANOCOBALAMIN) 1000 MCG tablet Take 1,000 mcg by mouth daily.   Yes [provider]    Allergies as of 03/29/2018 - Review Complete 03/29/2018  Allergen Reaction Noted  . Carvedilol Other (See Comments) 08/30/2017  . Hctz [hydrochlorothiazide] Other (See Comments) 08/30/2017  . Spironolactone Other (See Comments) 12/01/2017    Family History  Problem Relation Age of Onset  . Heart disease Mother        CHF, CAD  . Hypertension Mother   . Mental illness Mother        Dementia  . Cancer Mother        breast, Brain Cancer  . Breast cancer Mother 68  . Cancer Father        lung  . Heart disease Father        MI  . Heart disease Sister   . Cancer Sister        Breast  . Barrett's esophagus Sister   . Hyperlipidemia Sister   . Breast cancer Sister 26  . Asthma Brother   . Heart disease Brother   . Diabetes Brother   . Anxiety disorder Brother   . Neuropathy Brother   . Neuropathy Sister   . Anxiety disorder Sister   . Mental illness Sister        Dementia  .  Fibromyalgia Sister     Social History   Socioeconomic History  . Marital status: Widowed    Spouse name: Not on file  . Number of children: 2  . Years of education: Not on file  . Highest education level: Not on file  Occupational History  . Occupation: Retired  Scientific laboratory technician  . Financial resource strain: Not on file  . Food insecurity:    Worry: Not on file    Inability: Not on file  . Transportation needs:    Medical: Not on file    Non-medical: Not on file  Tobacco Use  . Smoking status: Former Smoker  Packs/day: 0.50    Years: 20.00    Pack years: 10.00    Types: Cigarettes    Last attempt to quit: 06/20/1977    Years since quitting: 40.9  . Smokeless tobacco: Never Used  Substance and Sexual Activity  . Alcohol use: Yes    Alcohol/week: 1.0 - 3.0 standard drinks    Types: 1 - 2 Standard drinks or equivalent per week    Comment: occasional  . Drug use: No  . Sexual activity: Not on file  Lifestyle  . Physical activity:    Days per week: Not on file    Minutes per session: Not on file  . Stress: Not on file  Relationships  . Social connections:    Talks on phone: Not on file    Gets together: Not on file    Attends religious service: Not on file    Active member of club or organization: Not on file    Attends meetings of clubs or organizations: Not on file    Relationship status: Not on file  . Intimate partner violence:    Fear of current or ex partner: Not on file    Emotionally abused: Not on file    Physically abused: Not on file    Forced sexual activity: Not on file  Other Topics Concern  . Not on file  Social History Narrative  . Not on file    Review of Systems: See HPI, otherwise negative ROS  Physical Exam: BP (!) 177/61   Pulse 63   Temp (!) 97.3 F (36.3 C) (Temporal)   Resp 16   Ht 5\' 7"  (1.702 m)   Wt 98 kg   SpO2 100%   BMI 33.83 kg/m  General:   Alert,  pleasant and cooperative in NAD Head:  Normocephalic and  atraumatic. Neck:  Supple; no masses or thyromegaly. Lungs:  Clear throughout to auscultation.    Heart:  Regular rate and rhythm. Abdomen:  Soft, nontender and nondistended. Normal bowel sounds, without guarding, and without rebound.   Neurologic:  Alert and  oriented x4;  grossly normal neurologically.  Impression/Plan: Barbara Hernandez is here for an endoscopy to be performed for Barrett's esophagus  Risks, benefits, limitations, and alternatives regarding  endoscopy have been reviewed with the patient.  Questions have been answered.  All parties agreeable.   Barbara Lame, MD  05/11/2018, 11:07 AM

## 2018-05-11 NOTE — Transfer of Care (Signed)
Immediate Anesthesia Transfer of Care Note  Patient: Barbara Hernandez  Procedure(s) Performed: ESOPHAGOGASTRODUODENOSCOPY (EGD) WITH PROPOFOL (N/A )  Patient Location: PACU  Anesthesia Type: General  Level of Consciousness: awake, alert  and patient cooperative  Airway and Oxygen Therapy: Patient Spontanous Breathing and Patient connected to supplemental oxygen  Post-op Assessment: Post-op Vital signs reviewed, Patient's Cardiovascular Status Stable, Respiratory Function Stable, Patent Airway and No signs of Nausea or vomiting  Post-op Vital Signs: Reviewed and stable  Complications: No apparent anesthesia complications

## 2018-05-11 NOTE — Anesthesia Postprocedure Evaluation (Signed)
Anesthesia Post Note  Patient: Barbara Hernandez  Procedure(s) Performed: ESOPHAGOGASTRODUODENOSCOPY (EGD) WITH PROPOFOL (N/A )  Patient location during evaluation: PACU Anesthesia Type: General Level of consciousness: awake Pain management: pain level controlled Vital Signs Assessment: post-procedure vital signs reviewed and stable Respiratory status: respiratory function stable Cardiovascular status: stable Postop Assessment: no signs of nausea or vomiting Anesthetic complications: no    Veda Canning

## 2018-05-11 NOTE — Op Note (Addendum)
[facility] Gastroenterology Patient Name: Barbara Hernandez Procedure Date: 05/11/2018 11:14 AM MRN: [id number] Account #: [id number] Date of Birth: 08-18-1940 Admit Type: Outpatient Age: 77 Room: [facility] OR ROOM 01 Gender: Female Note Status: Finalized Procedure:            Upper GI endoscopy Indications:          Follow-up of Barrett's esophagus Providers:            Lucilla Lame MD, MD Referring MD:         Kerin Perna MD, MD (Referring MD) Medicines:            Propofol per Anesthesia Complications:        No immediate complications. Procedure:            Pre-Anesthesia Assessment:                       - Prior to the procedure, a History and Physical was                        performed, and patient medications and allergies were                        reviewed. The patient's tolerance of previous                        anesthesia was also reviewed. The risks and benefits of                        the procedure and the sedation options and risks were                        discussed with the patient. All questions were                        answered, and informed consent was obtained. Prior                        Anticoagulants: The patient has taken no previous                        anticoagulant or antiplatelet agents. ASA Grade                        Assessment: II - A patient with mild systemic disease.                        After reviewing the risks and benefits, the patient was                        deemed in satisfactory condition to undergo the                        procedure.                       After obtaining informed consent, the endoscope was                        passed under direct vision. Throughout the procedure,  the patient's blood pressure, pulse, and oxygen                        saturations were monitored continuously. The was                        introduced through the mouth, and advanced to the                second part of duodenum. The upper GI endoscopy was                        accomplished without difficulty. The patient tolerated                        the procedure well. Findings:      The Z-line was irregular and was found at the gastroesophageal junction.       Biopsies were taken with a cold forceps for histology.      The stomach was normal.      The examined duodenum was normal. Impression:           - Z-line irregular, at the gastroesophageal junction.                        Biopsied.                       - Normal stomach.                       - Normal examined duodenum. Recommendation:       - Discharge patient to home.                       - Resume previous diet.                       - Continue present medications.                       - Await pathology results. Procedure Code(s):    --- Professional ---                       [phone], Esophagogastroduodenoscopy, flexible, transoral;                        with biopsy, single or multiple Diagnosis Code(s):    --- Professional ---                       K22.70, Barrett's esophagus without dysplasia                       K22.8, Other specified diseases of esophagus CPT copyright 2018 American Medical Association. All rights reserved. The codes documented in this report are preliminary and upon coder review may  be revised to meet current compliance requirements. Lucilla Lame MD, MD 05/11/2018 11:34:34 AM This report has been signed electronically. Number of Addenda: 0 Note Initiated On: 05/11/2018 11:14 AM Total Procedure Duration: 0 hours 3 minutes 1 second       [facility]

## 2018-05-11 NOTE — Anesthesia Preprocedure Evaluation (Signed)
Anesthesia Evaluation  Patient identified by MRN, date of birth, ID band Patient awake    Reviewed: Allergy & Precautions, NPO status , Patient's Chart, lab work & pertinent test results  History of Anesthesia Complications Negative for: history of anesthetic complications  Airway Mallampati: I  TM Distance: >3 FB Neck ROM: Full    Dental  (+) Lower Dentures, Upper Dentures   Pulmonary sleep apnea and Continuous Positive Airway Pressure Ventilation , former smoker,    breath sounds clear to auscultation       Cardiovascular Exercise Tolerance: Good hypertension, + Peripheral Vascular Disease   Rhythm:Regular Rate:Normal + Systolic murmurs Innocent murmur  ECG 09/29/17: normal  Echo 08/05/17:  NORMAL LEFT VENTRICULAR SYSTOLIC FUNCTION WITH AN ESTIMATED EF = 55 % NORMAL RIGHT VENTRICULAR SYSTOLIC FUNCTION MILD-TO-MODERATE MITRAL VALVE INSUFFICIENCY MILD TRICUSPID VALVE INSUFFICIENCY NO VALVULAR STENOSIS MILD BIATRIAL ENLARGEMENT    Neuro/Psych Vertigo     GI/Hepatic GERD  ,Colovesical fistula   Endo/Other  Hypothyroidism Obesity - BMI 34  Renal/GU negative Renal ROS     Musculoskeletal  (+) Arthritis , Osteoarthritis,    Abdominal   Peds  Hematology  (+) Blood dyscrasia, anemia ,   Anesthesia Other Findings   Reproductive/Obstetrics                             Anesthesia Physical  Anesthesia Plan  ASA: III  Anesthesia Plan: General   Post-op Pain Management:    Induction: Intravenous  PONV Risk Score and Plan: 3 and TIVA  Airway Management Planned: Natural Airway and Nasal Cannula  Additional Equipment:   Intra-op Plan:   Post-operative Plan:   Informed Consent: I have reviewed the patients History and Physical, chart, labs and discussed the procedure including the risks, benefits and alternatives for the proposed anesthesia with the patient or authorized  representative who has indicated his/her understanding and acceptance.     Plan Discussed with: CRNA  Anesthesia Plan Comments:         Anesthesia Quick Evaluation

## 2018-05-11 NOTE — Anesthesia Procedure Notes (Signed)
Date/Time: 05/11/2018 11:33 AM Performed by: Cameron Ali, CRNA Pre-anesthesia Checklist: Patient identified, Emergency Drugs available, Suction available, Timeout performed and Patient being monitored Patient Re-evaluated:Patient Re-evaluated prior to induction Oxygen Delivery Method: Nasal cannula Placement Confirmation: positive ETCO2

## 2018-05-12 ENCOUNTER — Encounter: Payer: Self-pay | Admitting: Gastroenterology

## 2018-06-01 ENCOUNTER — Telehealth: Payer: Self-pay

## 2018-06-01 ENCOUNTER — Encounter: Payer: Self-pay | Admitting: Gastroenterology

## 2018-06-01 NOTE — Telephone Encounter (Signed)
-----   Message from Lucilla Lame, MD sent at 06/01/2018  4:02 PM EST ----- Let the patient know that her Barrett's esophagus did not show any signs of transformation to cancer.  The patient's repeat EGD should be in 3 years.

## 2018-06-01 NOTE — Telephone Encounter (Signed)
Left vm with results of procedure.

## 2018-06-19 ENCOUNTER — Ambulatory Visit
Admission: RE | Admit: 2018-06-19 | Discharge: 2018-06-19 | Disposition: A | Discharge status: Home / Self Care | Payer: Medicare Other | Source: Ambulatory Visit | Attending: Family Medicine | Admitting: Family Medicine

## 2018-06-19 DIAGNOSIS — Z1231 Encounter for screening mammogram for malignant neoplasm of breast: Secondary | ICD-10-CM | POA: Diagnosis present

## 2018-06-19 DIAGNOSIS — Z780 Asymptomatic menopausal state: Secondary | ICD-10-CM

## 2018-06-27 ENCOUNTER — Ambulatory Visit: Payer: Medicare Other | Admitting: Urology

## 2018-06-27 ENCOUNTER — Encounter: Payer: Self-pay | Admitting: Urology

## 2018-06-27 VITALS — BP 163/84 | HR 67 | Ht 62.0 in | Wt 225.0 lb

## 2018-06-27 DIAGNOSIS — Z8551 Personal history of malignant neoplasm of bladder: Secondary | ICD-10-CM | POA: Diagnosis not present

## 2018-06-27 LAB — URINALYSIS, COMPLETE
Bilirubin, UA: NEGATIVE
Glucose, UA: NEGATIVE
Ketones, UA: NEGATIVE
Leukocytes, UA: NEGATIVE
NITRITE UA: NEGATIVE
PROTEIN UA: NEGATIVE
RBC, UA: NEGATIVE
Specific Gravity, UA: 1.02 (ref 1.005–1.030)
Urobilinogen, Ur: 0.2 mg/dL (ref 0.2–1.0)
pH, UA: 6 (ref 5.0–7.5)

## 2018-06-27 LAB — MICROSCOPIC EXAMINATION: RBC, UA: NONE SEEN /hpf (ref 0–2)

## 2018-06-27 NOTE — Progress Notes (Signed)
   06/27/2018   CC:  Chief Complaint  Patient presents with  . Cysto    HPI: Barbara Hernandez is a 78 y.o. 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  She initially presented with gross hematuria and underwent a hematuria workup that included a CT urogram on 09/2014 as well as a cystoscopy that revealed a small 1 cm tumor just beyond the left UO. She underwent a TURBT in 11/2013 with pathology consistent with PUNLMP.  On a CT abdomen pelvis with contrast on 01/06/2018 as ordered by gastroenterology, Dr. Allen Norris, she was found to have a significant colovesical fistula. In addition to above, she also has what appears to be an abscess/fluid-filled collection measuring 2.4 x 4.6 x 3.8 adjacent and possibly involving the bladder. She was started on Cipro/Flagyl by myself on 01/09/2018 with symptom improvement.   She underwent colovesical fistula repair/colectomy on 01/2018 which was uncomplicated.  Done well postoperatively.  No voiding complaints today. No urgency or UTIs since surgery.   She is a former smoker (15 years, 1 pack daily) and quit in her mid 21s.   Blood pressure (!) 163/84, pulse 67, height 5\' 2"  (1.575 m), weight 225 lb (102.1 kg).  NED. A&Ox3.   No respiratory distress   Abd soft, NT, ND Normal external genitalia with patent urethral meatus  Cystoscopy Procedure Note  Patient identification was confirmed, informed consent was obtained, and patient was prepped using Betadine solution.  Lidocaine jelly was administered per urethral meatus.    Procedure: - Flexible cystoscope introduced, without any difficulty.   - Thorough search of the bladder revealed:    normal urethral meatus    normal urothelium, Stellate scar on left bladder wall beyond UO at the sit o previous resection    no stones    no ulcers     no tumors    no urethral polyps    no  trabeculation  - Ureteral orifices were normal in position and appearance.  Post-Procedure: - Patient tolerated the procedure well  Assessment/ Plan:  1. Bladder neoplasm of uncertain malignant potential Personal history of bladder neoplasm low malignant potential status post TURBT in 2016 without recurrence Cystoscopy today unremarkable Given absence of recurrence and low malignant potential, will space out cystoscopies to every 2 years   Return in about 2 years (around 06/27/2020) for surveillance cystoscopy.   I, Temidayo Atanda-Ogunleye , am acting as a scribe for Hollice Espy, MD  I have reviewed the above documentation for accuracy and completeness, and I agree with the above.   Hollice Espy, MD

## 2019-01-09 ENCOUNTER — Encounter: Payer: Self-pay | Admitting: General Surgery

## 2019-01-16 ENCOUNTER — Telehealth: Payer: Self-pay | Admitting: General Practice

## 2019-01-16 NOTE — Telephone Encounter (Signed)
Patient called she was in recalls, patient is already being taken care of at [facility] with Dr. Allen Norris.

## 2019-03-01 ENCOUNTER — Ambulatory Visit: Payer: Medicare Other | Admitting: General Surgery

## 2019-04-01 IMAGING — MG DIGITAL SCREENING BILATERAL MAMMOGRAM WITH TOMO AND CAD
6 of 10 series · 6 of 30 positions shown · non-contrast
Comparison: Previous exam(s).

CLINICAL DATA: Screening.

EXAM:
DIGITAL SCREENING BILATERAL MAMMOGRAM WITH TOMO AND CAD

[L CC synth-2D]
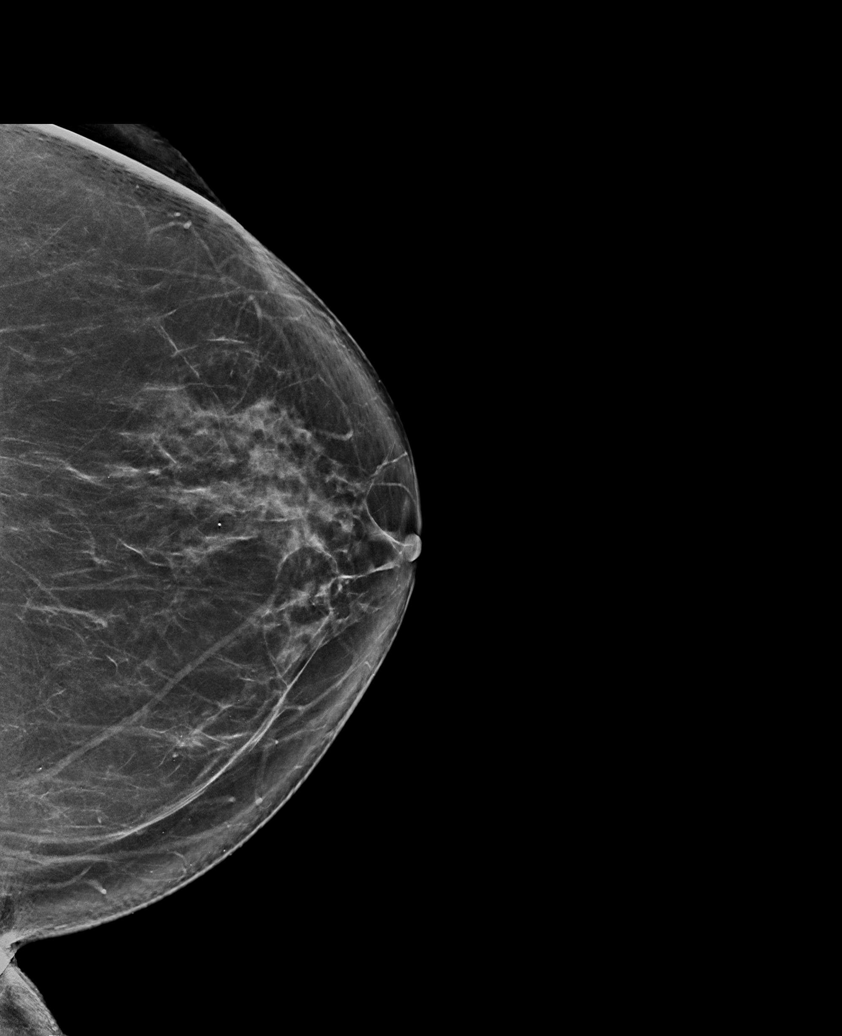

[R MLO synth-2D (1 of 2)]
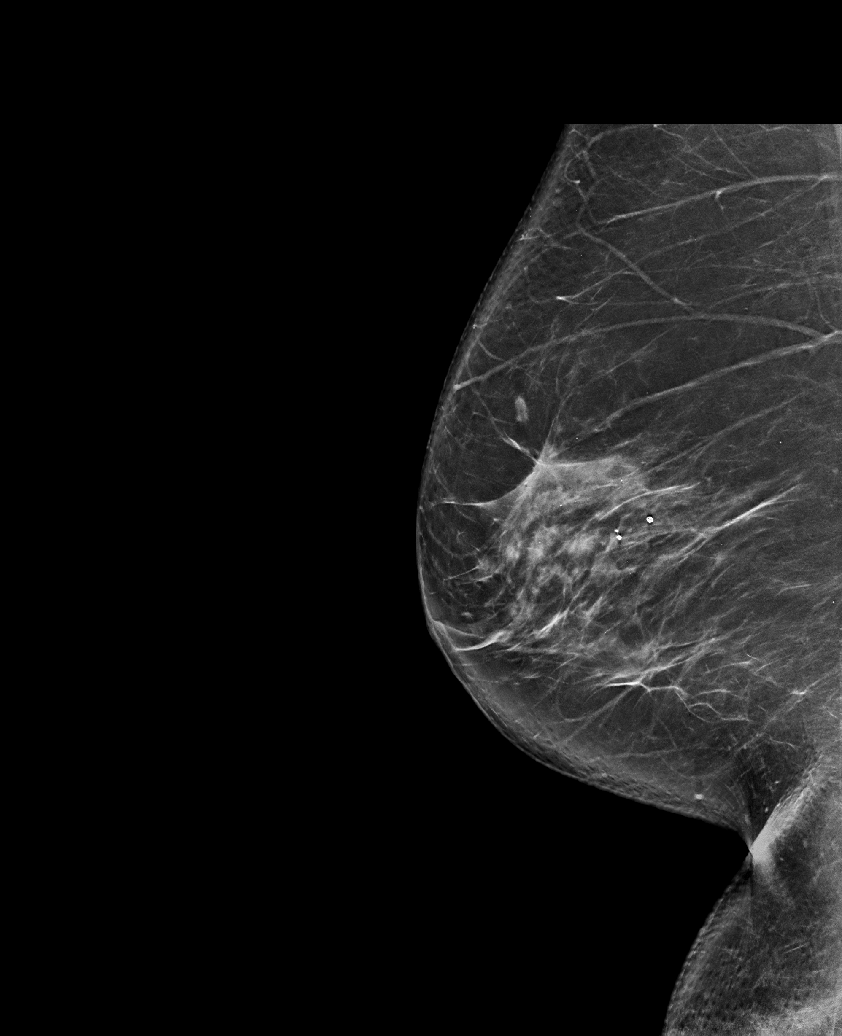

[R MLO synth-2D (2 of 2)]
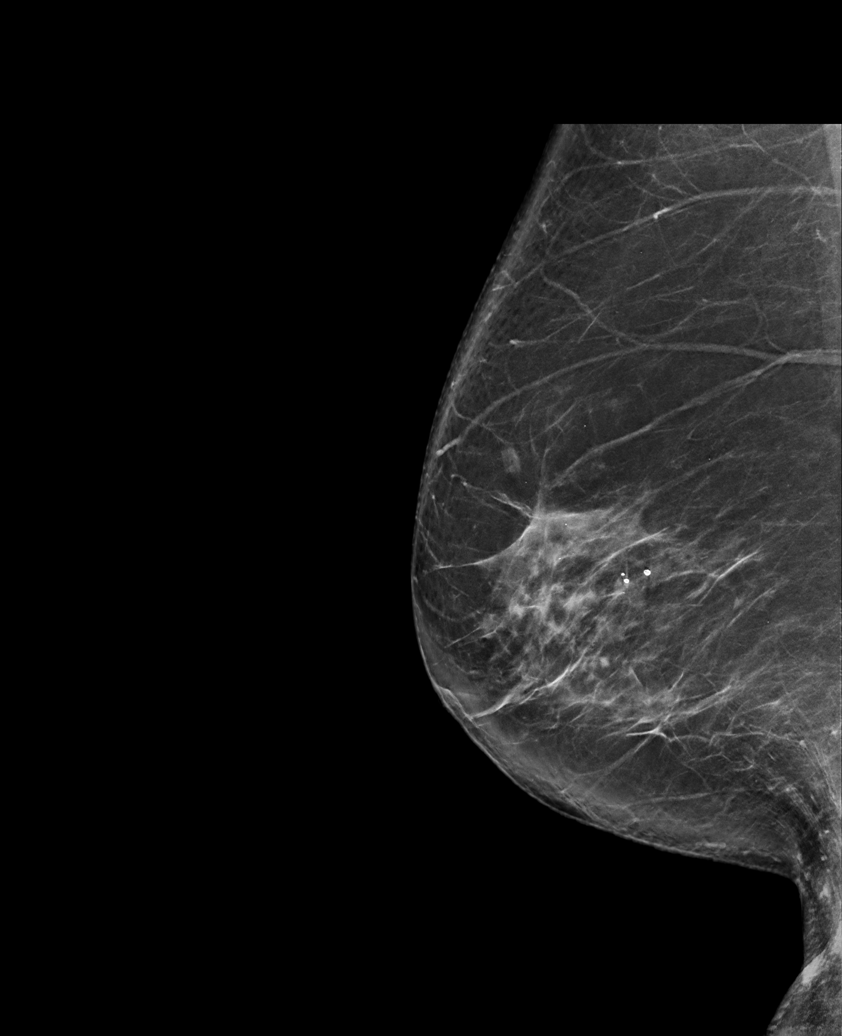

[R CC synth-2D]
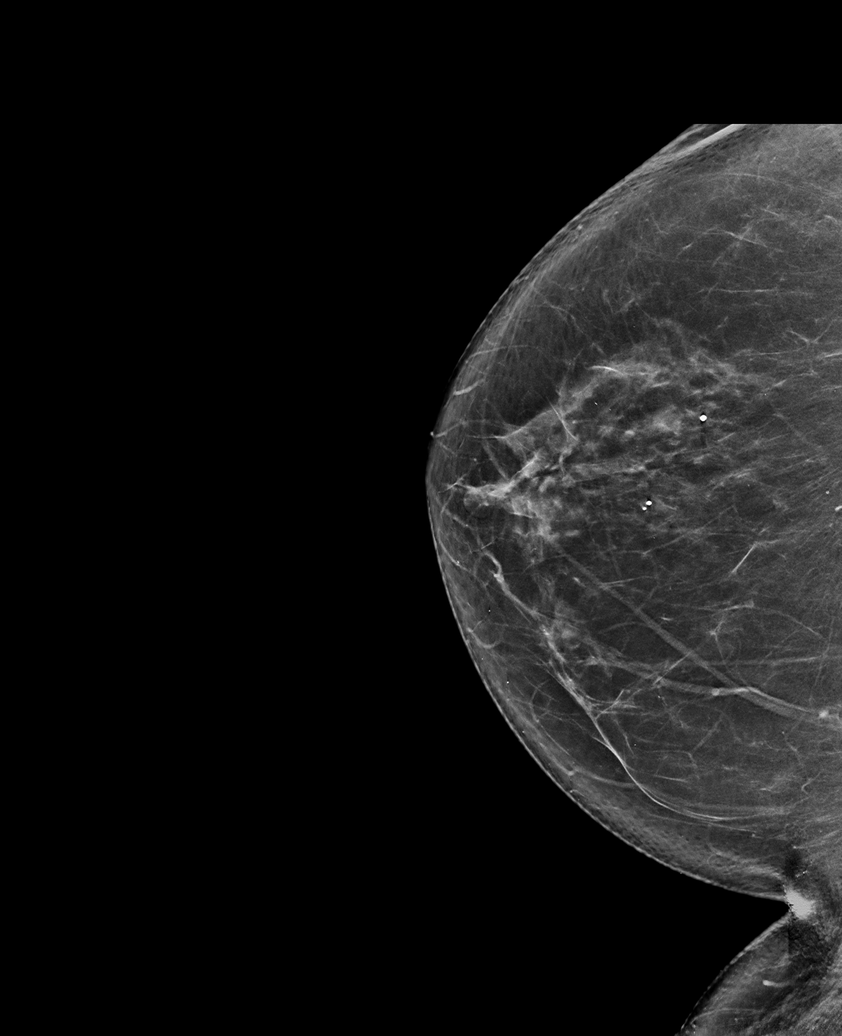

[L MLO synth-2D]
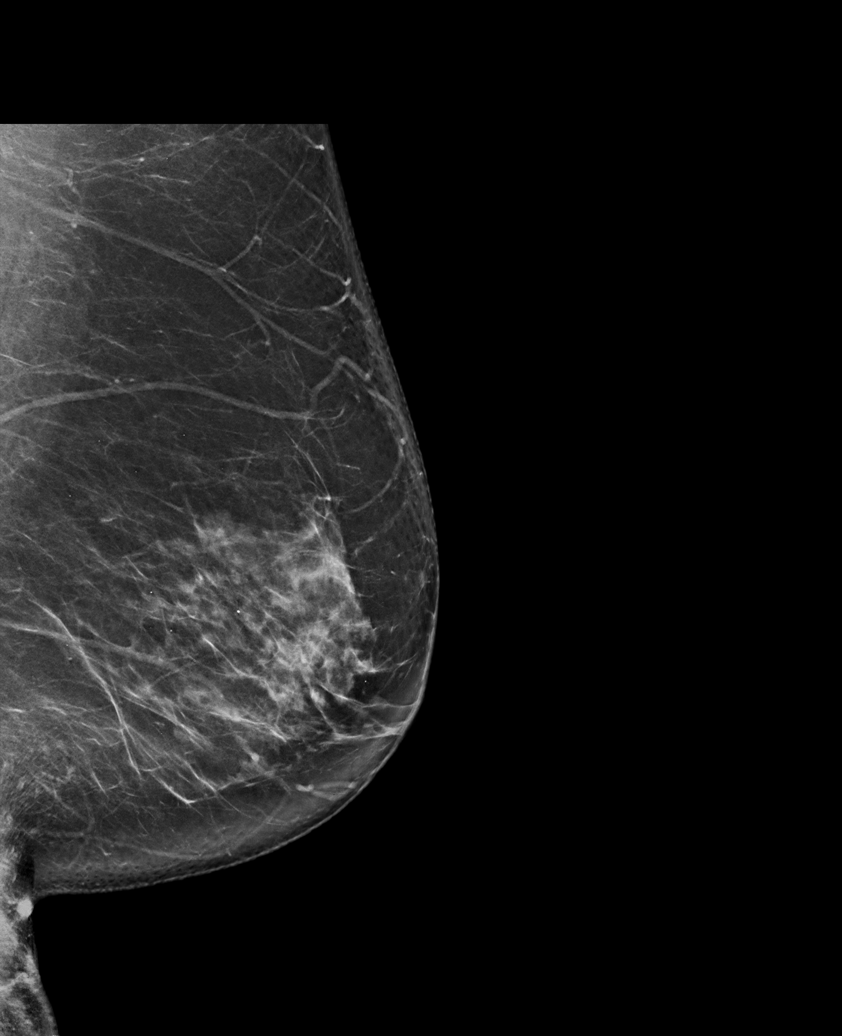

[R CC tomo · tomo slice 42/83.0]
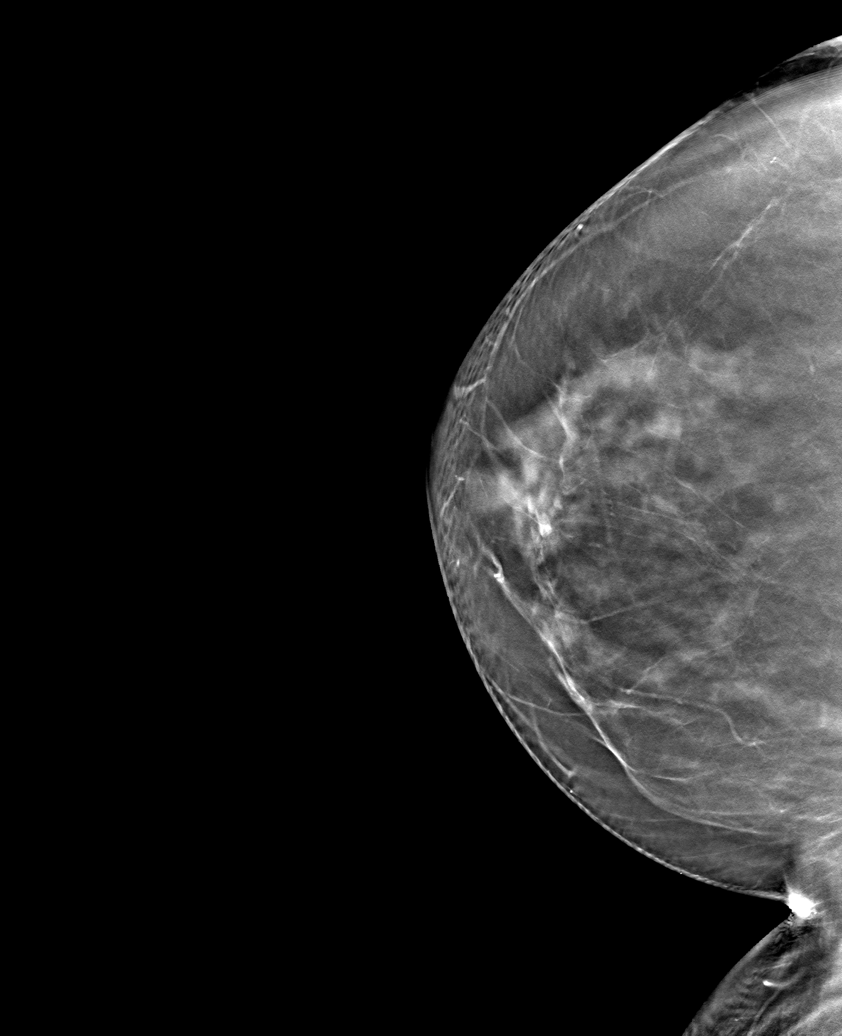

[6 of 30 positions shown; findings below may reference images not displayed]

ACR Breast Density Category b: There are scattered areas of
fibroglandular density.
FINDINGS: There are no findings suspicious for malignancy. Images were
processed with CAD.
IMPRESSION: No mammographic evidence of malignancy. A result letter of this
screening mammogram will be mailed directly to the patient.

RECOMMENDATION:
Screening mammogram in one year. (Code:[id number])

BI-RADS CATEGORY  1: Negative.

## 2019-05-08 ENCOUNTER — Other Ambulatory Visit: Payer: Self-pay | Admitting: Family Medicine

## 2019-05-08 DIAGNOSIS — Z1231 Encounter for screening mammogram for malignant neoplasm of breast: Secondary | ICD-10-CM

## 2019-06-21 ENCOUNTER — Ambulatory Visit
Admission: RE | Admit: 2019-06-21 | Discharge: 2019-06-21 | Disposition: A | Discharge status: Home / Self Care | Payer: Medicare Other | Source: Ambulatory Visit | Attending: Family Medicine | Admitting: Family Medicine

## 2019-06-21 DIAGNOSIS — Z1231 Encounter for screening mammogram for malignant neoplasm of breast: Secondary | ICD-10-CM | POA: Insufficient documentation

## 2020-03-19 ENCOUNTER — Other Ambulatory Visit: Payer: Medicare Other

## 2020-05-08 ENCOUNTER — Other Ambulatory Visit: Payer: Self-pay | Admitting: Family Medicine

## 2020-05-09 ENCOUNTER — Other Ambulatory Visit: Payer: Self-pay | Admitting: Family Medicine

## 2020-05-09 DIAGNOSIS — Z1231 Encounter for screening mammogram for malignant neoplasm of breast: Secondary | ICD-10-CM

## 2020-07-01 ENCOUNTER — Other Ambulatory Visit: Payer: Self-pay

## 2020-07-01 ENCOUNTER — Ambulatory Visit (INDEPENDENT_AMBULATORY_CARE_PROVIDER_SITE_OTHER): Payer: Medicare PPO | Admitting: Urology

## 2020-07-01 VITALS — BP 158/76 | HR 63 | Ht 62.0 in | Wt 199.0 lb

## 2020-07-01 DIAGNOSIS — Z8551 Personal history of malignant neoplasm of bladder: Secondary | ICD-10-CM

## 2020-07-01 NOTE — Progress Notes (Signed)
   07/01/20  CC:  Chief Complaint  Patient presents with  . Cysto    HPI: Barbara Hernandez is a 80 y.o. 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  She initially presented with gross hematuria and underwent a hematuria workup that included a CT urogram on 09/2014 as well as a cystoscopy that revealed a small 1 cm tumor just beyond the left UO. She underwent a TURBT in 11/2013 with pathology consistent with PUNLMP.  On a CT abdomen pelvis with contrast on 01/06/2018 as ordered by gastroenterology, Dr. Allen Norris, she was found to have a significant colovesical fistula. In addition to above, she also has what appears to be an abscess/fluid-filled collection measuring 2.4 x 4.6 x 3.8 adjacent and possibly involving the bladder. She was started on Cipro/Flagyl by myself on 01/09/2018 with symptom improvement.   She underwent colovesical fistula repair/colectomy on 01/2018 which was uncomplicated.   No voiding complaints today. No urgency or UTIs since surgery.   She is a former smoker (15 years, 1 pack daily) and quit in her mid 56s.  Last cystoscopy was 2 years ago.  No microscopic blood on dip today.  Blood pressure (!) 158/76, pulse 63, height 5\' 2"  (1.575 m), weight 199 lb (90.3 kg). NED. A&Ox3.   No respiratory distress   Abd soft, NT, ND Normal external genitalia with patent urethral meatus  Cystoscopy Procedure Note  Patient identification was confirmed, informed consent was obtained, and patient was prepped using Betadine solution.  Lidocaine jelly was administered per urethral meatus.    Procedure: - Flexible cystoscope introduced, without any difficulty.   - Thorough search of the bladder revealed:    normal urethral meatus    normal urothelium with stellate scar on left lateral bladder wall    no stones    no ulcers     no tumors    no urethral  polyps    no trabeculation  - Ureteral orifices were normal in position and appearance.  Post-Procedure: - Patient tolerated the procedure well  Assessment/ Plan:  1. History of bladder cancer Remote history of bladder neoplasm of uncertain malignant potential without recurrence  We had another shared decision-making conversation today about whether or not to continue surveillance cystoscopy.  At this time, given that its been 7 years since any sort of bladder lesion along with unclear malignant potential of that particular lesion, the feel that she is very low risk for recurrence.  At this point time, she would like to abstain from further surveillance cystoscopies.  Advised her to continue to have her PCP check her urinalysis yearly we will refer her back if she has any microscopic hematuria.  She also will come back sooner if she develops any bladder symptoms, recurrent infections, or any gross hematuria.  As needed  - Urinalysis, Complete     Hollice Espy, MD

## 2020-07-02 LAB — URINALYSIS, COMPLETE
Bilirubin, UA: NEGATIVE
Glucose, UA: NEGATIVE
Ketones, UA: NEGATIVE
Leukocytes,UA: NEGATIVE
Nitrite, UA: NEGATIVE
Protein,UA: NEGATIVE
RBC, UA: NEGATIVE
Specific Gravity, UA: 1.015 (ref 1.005–1.030)
Urobilinogen, Ur: 0.2 mg/dL (ref 0.2–1.0)
pH, UA: 7 (ref 5.0–7.5)

## 2020-07-02 LAB — MICROSCOPIC EXAMINATION
Bacteria, UA: NONE SEEN
RBC, Urine: NONE SEEN /hpf (ref 0–2)

## 2020-08-07 ENCOUNTER — Other Ambulatory Visit: Payer: Self-pay

## 2020-08-07 ENCOUNTER — Ambulatory Visit
Admission: RE | Admit: 2020-08-07 | Discharge: 2020-08-07 | Disposition: A | Discharge status: Home / Self Care | Payer: Medicare PPO | Source: Ambulatory Visit | Attending: Family Medicine | Admitting: Family Medicine

## 2020-08-07 DIAGNOSIS — Z1231 Encounter for screening mammogram for malignant neoplasm of breast: Secondary | ICD-10-CM | POA: Diagnosis present

## 2021-08-20 ENCOUNTER — Other Ambulatory Visit: Payer: Self-pay | Admitting: Gerontology

## 2021-08-20 DIAGNOSIS — Z1231 Encounter for screening mammogram for malignant neoplasm of breast: Secondary | ICD-10-CM

## 2021-10-27 ENCOUNTER — Ambulatory Visit
Admission: RE | Admit: 2021-10-27 | Discharge: 2021-10-27 | Disposition: A | Discharge status: Home / Self Care | Payer: Medicare PPO | Source: Ambulatory Visit | Attending: Gerontology | Admitting: Gerontology

## 2021-10-27 DIAGNOSIS — Z1231 Encounter for screening mammogram for malignant neoplasm of breast: Secondary | ICD-10-CM | POA: Diagnosis present

## 2021-12-24 ENCOUNTER — Other Ambulatory Visit: Payer: Self-pay

## 2021-12-24 ENCOUNTER — Ambulatory Visit (INDEPENDENT_AMBULATORY_CARE_PROVIDER_SITE_OTHER): Payer: Medicare PPO

## 2021-12-24 ENCOUNTER — Encounter: Payer: Self-pay | Admitting: Emergency Medicine

## 2021-12-24 ENCOUNTER — Ambulatory Visit
Admission: EM | Admit: 2021-12-24 | Discharge: 2021-12-24 | Disposition: A | Discharge status: Home / Self Care | Payer: Medicare PPO | Attending: Emergency Medicine | Admitting: Emergency Medicine

## 2021-12-24 DIAGNOSIS — R0789 Other chest pain: Secondary | ICD-10-CM

## 2021-12-24 DIAGNOSIS — G4730 Sleep apnea, unspecified: Secondary | ICD-10-CM | POA: Diagnosis not present

## 2021-12-24 DIAGNOSIS — Z7951 Long term (current) use of inhaled steroids: Secondary | ICD-10-CM | POA: Diagnosis not present

## 2021-12-24 DIAGNOSIS — J029 Acute pharyngitis, unspecified: Secondary | ICD-10-CM | POA: Diagnosis not present

## 2021-12-24 DIAGNOSIS — R062 Wheezing: Secondary | ICD-10-CM | POA: Diagnosis not present

## 2021-12-24 DIAGNOSIS — I340 Nonrheumatic mitral (valve) insufficiency: Secondary | ICD-10-CM | POA: Diagnosis not present

## 2021-12-24 DIAGNOSIS — R0602 Shortness of breath: Secondary | ICD-10-CM | POA: Diagnosis present

## 2021-12-24 DIAGNOSIS — Z20822 Contact with and (suspected) exposure to covid-19: Secondary | ICD-10-CM | POA: Insufficient documentation

## 2021-12-24 DIAGNOSIS — I119 Hypertensive heart disease without heart failure: Secondary | ICD-10-CM | POA: Insufficient documentation

## 2021-12-24 DIAGNOSIS — I6529 Occlusion and stenosis of unspecified carotid artery: Secondary | ICD-10-CM | POA: Diagnosis not present

## 2021-12-24 DIAGNOSIS — D510 Vitamin B12 deficiency anemia due to intrinsic factor deficiency: Secondary | ICD-10-CM | POA: Insufficient documentation

## 2021-12-24 LAB — SARS CORONAVIRUS 2 BY RT PCR: SARS Coronavirus 2 by RT PCR: NEGATIVE

## 2021-12-24 MED ORDER — AEROCHAMBER MV MISC: 2 refills | Status: AC

## 2021-12-24 MED ORDER — ALBUTEROL SULFATE HFA 108 (90 BASE) MCG/ACT IN AERS: 2.0000 | INHALATION_SPRAY | RESPIRATORY_TRACT | 0 refills | Status: DC | PRN

## 2021-12-24 MED ORDER — PROMETHAZINE-DM 6.25-15 MG/5ML PO SYRP: 5.0000 mL | ORAL_SOLUTION | Freq: Four times a day (QID) | ORAL | 0 refills | Status: DC | PRN

## 2021-12-24 MED ORDER — BENZONATATE 100 MG PO CAPS: 200.0000 mg | ORAL_CAPSULE | Freq: Three times a day (TID) | ORAL | 0 refills | Status: DC

## 2021-12-24 MED ORDER — PREDNISONE 20 MG PO TABS: 40.0000 mg | ORAL_TABLET | Freq: Every day | ORAL | 0 refills | Status: DC

## 2021-12-24 NOTE — ED Provider Notes (Signed)
[facility]    CSN: [id number] Arrival date & time: 12/24/21  0955      History   Chief Complaint Chief Complaint  Patient presents with   Wheezing   Shortness of Breath    HPI Barbara Hernandez is a 81 y.o. female.   HPI  81 year old female here for evaluation of respiratory complaints.  Patient reports that she had just returned from a cruise to [place] days ago.  Upon returning home she developed a cough that is productive for clear sputum and some congestion that was in her throat.  This has progressed to some mild shortness of breath as well as dyspnea on exertion.  She also endorses a mild sore throat.  Denies fever, runny nose, nasal congestion, ear pain, chest pain, or swelling in her feet and legs.  Does have a significant history of pernicious anemia, mild left ventricular hypertrophy, mitral incompetence, hypertension, sleep apnea, and carotid artery narrowing.  Patient is followed by cardiology.  Patient's most recent CBC was from March 2023 which does not show any signs of anemia as her H&H at that time was 13 and 40.6.  MCV, MCH, and MCHC were all within normal limits at 90.8, 29.1, and 32 respectively.  Overall red blood cell count was 4.47.  Patient ports that she does have an inhaler but she did not feel any improvement of her symptoms after using it so she stopped.  Past Medical History:  Diagnosis Date   Anemia    Arthritis    Barrett's esophagus 2012   2010 upper endoscopy showed only reflux. 2012 biopsies suggested Barrett's epithelial changes.   Bladder tumor    BPPV (benign paroxysmal positional vertigo) 2004   Cancer [facility]) 01/2018   bladder with low potential malignancy. tumor removed and did not require further treatment   Carotid artery narrowing 08/14/2014   Coronary artery disease 01/2018   sees Dr. Nehemiah Massed   Family history of adverse reaction to anesthesia    sister had memory problems after delivery of child   GERD (gastroesophageal  reflux disease)    H/O measles    Heart murmur    Hematuria    Hematuria    Hemorrhoid    Hemorrhoids    History of chicken pox    Hyperlipidemia    Hypertension    Hypothyroidism    Joint pain    Low sodium levels    Mild left ventricular hypertrophy    Onychomycosis    Perforation of sigmoid colon due to diverticulitis 09/26/2017   Primary localized osteoarthritis of left knee 09/06/2017   Reflux    Reflux esophagitis    S/P total knee replacement, 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   left knee replacement 09/2017   Sleep apnea 2011   Uses C-Pap machine   Thyroid disease    Wears dentures    full upper and lower    Patient Active Problem List   Diagnosis Date Noted   S/P laparoscopic colectomy 02/13/2018   Recto-bladder neck fistula    Fistula of intestine, excluding rectum and anus    Preop examination    Obesity, unspecified 10/13/2017   Perforation of sigmoid colon due to diverticulitis 09/26/2017   Primary localized osteoarthritis of left knee 09/06/2017   Benign essential hypertension 03/18/2015   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   Arthritis, degenerative 11/22/2014   Hypothyroidism 10/08/2014   Barrett's esophagus 10/08/2014   Hypercholesteremia 10/08/2014   Hypertension 10/08/2014   Hematuria 10/08/2014   Mild left ventricular hypertrophy 10/08/2014  Pernicious anemia 10/08/2014   Vitamin D deficiency 10/08/2014   Vertigo, benign paroxysmal 41/96/2229   Systolic murmur 79/89/2119   Sleep apnea 10/08/2014   Arthritis of shoulder region, right, degenerative 10/08/2014   Allergic rhinitis 10/08/2014   Carotid artery narrowing 08/14/2014   MI (mitral incompetence) 08/14/2014   Esophageal reflux 09/07/2012   Hemorrhoid     Past Surgical History:  Procedure Laterality Date   BLEPHAROPLASTY Bilateral 2015   BREAST EXCISIONAL BIOPSY Left 1988   benign   COLONOSCOPY W/ BIOPSIES N/A 01/16/2013   No source for GI blood loss, mild lymphatic prominence in the rectum.   COLONOSCOPY  WITH PROPOFOL N/A 01/20/2018   Procedure: COLONOSCOPY WITH PROPOFOL;  Surgeon: Lucilla Lame, MD;  Location: [place];  Service: Endoscopy;  Laterality: N/A;  sleep apnea   ESOPHAGOGASTRODUODENOSCOPY (EGD) WITH PROPOFOL N/A 03/15/2016   Procedure: ESOPHAGOGASTRODUODENOSCOPY (EGD) WITH PROPOFOL;  Surgeon: Robert Bellow, MD;  Location: [facility];  Service: Endoscopy;  Laterality: N/A;   ESOPHAGOGASTRODUODENOSCOPY (EGD) WITH PROPOFOL N/A 05/11/2018   Procedure: ESOPHAGOGASTRODUODENOSCOPY (EGD) WITH PROPOFOL;  Surgeon: Lucilla Lame, MD;  Location: [place];  Service: Endoscopy;  Laterality: N/A;  sleep apnea   EYE SURGERY Bilateral 2006   cataract   LAPAROSCOPIC SIGMOID COLECTOMY N/A 02/13/2018   Procedure: LAPAROSCOPIC SIGMOID COLECTOMY;  Surgeon: Jules Husbands, MD;  Location: [facility];  Service: General;  Laterality: N/A;   Left wrist fracture Left 1980   pins removed   POPLITEAL SYNOVIAL CYST EXCISION Right    TOTAL KNEE ARTHROPLASTY Right 08/09/2016   Procedure: TOTAL KNEE ARTHROPLASTY;  Surgeon: Elsie Saas, MD;  Location: [place];  Service: Orthopedics;  Laterality: Right;   TOTAL KNEE ARTHROPLASTY Left 09/19/2017   Procedure: TOTAL KNEE ARTHROPLASTY;  Surgeon: Elsie Saas, MD;  Location: [place];  Service: Orthopedics;  Laterality: Left;   TRANSURETHRAL RESECTION OF BLADDER TUMOR N/A 11/20/2014   Procedure: TRANSURETHRAL RESECTION OF BLADDER TUMOR (TURBT);  Surgeon: Hollice Espy, MD;  Location: [facility];  Service: Urology;  Laterality: N/A;   UPPER GI ENDOSCOPY  2012, 2014    OB History     Gravida  2   Para  2   Term      Preterm      AB      Living  2      SAB      IAB      Ectopic      Multiple      Live Births           Obstetric Comments  Age at first menstrual period 31 Age at first pregnancy 45          Home Medications    Prior to Admission medications   Medication Sig Start Date End Date Taking? Authorizing Provider   albuterol (VENTOLIN HFA) 108 (90 Base) MCG/ACT inhaler Inhale 2 puffs into the lungs every 4 (four) hours as needed. 12/24/21  Yes Margarette Canada, NP  amLODipine (NORVASC) 5 MG tablet Take by mouth. 02/15/20 12/24/21 Yes [provider]  benzonatate (TESSALON) 100 MG capsule Take 2 capsules (200 mg total) by mouth every 8 (eight) hours. 12/24/21  Yes Margarette Canada, NP  Calcium Carbonate-Vit D-Min (CALCIUM 1200 PO) Take 1 tablet by mouth daily.    Yes [provider]  carvedilol (COREG) 3.125 MG tablet Take by mouth. 04/15/20 12/24/21 Yes [provider]  celecoxib (CELEBREX) 200 MG capsule Take 200 mg by mouth daily.    Yes  [provider]  Cholecalciferol (VITAMIN D3) 2000 UNITS TABS Take 2,000 Units by mouth every evening.    Yes [provider]  Docusate Sodium (DSS) 100 MG CAPS Take by mouth.   Yes [provider]  levothyroxine (SYNTHROID, LEVOTHROID) 125 MCG tablet Take 1 tablet (125 mcg total) by mouth daily. 09/11/15  Yes Margarita Rana, MD  lisinopril (PRINIVIL,ZESTRIL) 40 MG tablet Take 1 tablet (40 mg total) by mouth daily. bedtime Patient taking differently: Take 40 mg by mouth daily. 05/13/15  Yes Margarita Rana, MD  Multiple Vitamin (MULTIVITAMIN WITH MINERALS) TABS tablet Take 1 tablet by mouth daily.   Yes [provider]  omeprazole (PRILOSEC) 20 MG capsule Take 1 capsule (20 mg total) by mouth daily. 04/15/15  Yes Margarita Rana, MD  predniSONE (DELTASONE) 20 MG tablet Take 2 tablets (40 mg total) by mouth daily with breakfast for 5 days. 3 tablets daily for 5 days. 12/24/21 12/29/21 Yes Margarette Canada, NP  promethazine-dextromethorphan (PROMETHAZINE-DM) 6.25-15 MG/5ML syrup Take 5 mLs by mouth 4 (four) times daily as needed. 12/24/21  Yes Margarette Canada, NP  simvastatin (ZOCOR) 20 MG tablet Take 1 tablet (20 mg total) by mouth daily. bedtime 05/13/15  Yes Margarita Rana, MD  Spacer/Aero-Holding Chambers (AEROCHAMBER MV) inhaler Use as  instructed 12/24/21  Yes Margarette Canada, NP  vitamin B-12 (CYANOCOBALAMIN) 1000 MCG tablet Take 1,000 mcg by mouth daily.   Yes [provider]  Artificial Tear Solution (GENTEAL TEARS OP) Apply 1 application to eye at bedtime.    [provider]  aspirin EC 81 MG tablet Take 81 mg by mouth at bedtime.     [provider]    Family History Family History  Problem Relation Age of Onset   Heart disease Mother        CHF, CAD   Hypertension Mother    Mental illness Mother        Dementia   Cancer Mother        breast, Brain Cancer   Breast cancer Mother 72   Cancer Father        lung   Heart disease Father        MI   Heart disease Sister    Cancer Sister        Breast   Barrett's esophagus Sister    Hyperlipidemia Sister    Breast cancer Sister 69   Asthma Brother    Heart disease Brother    Diabetes Brother    Anxiety disorder Brother    Neuropathy Brother    Neuropathy Sister    Anxiety disorder Sister    Mental illness Sister        Dementia   Fibromyalgia Sister     Social History Social History   Tobacco Use   Smoking status: Former    Packs/day: 0.50    Years: 20.00    Total pack years: 10.00    Types: Cigarettes    Quit date: 06/20/1977    Years since quitting: 44.5   Smokeless tobacco: Never  Vaping Use   Vaping Use: Never used  Substance Use Topics   Alcohol use: Yes    Alcohol/week: 1.0 - 3.0 standard drink of alcohol    Types: 1 - 2 Standard drinks or equivalent per week    Comment: occasional   Drug use: No     Allergies   Carvedilol, Hctz [hydrochlorothiazide], and Spironolactone   Review of Systems Review of Systems  Constitutional:  Negative for  fever.  HENT:  Positive for sore throat. Negative for congestion, ear pain and rhinorrhea.   Respiratory:  Positive for cough, chest tightness, shortness of breath and wheezing.   Cardiovascular:  Negative for chest pain, palpitations and leg swelling.     Physical  Exam Triage Vital Signs ED Triage Vitals  Enc Vitals Group     BP 12/24/21 1015 133/67     Pulse Rate 12/24/21 1015 (!) 56     Resp 12/24/21 1015 20     Temp 12/24/21 1015 98.4 F (36.9 C)     Temp Source 12/24/21 1015 Oral     SpO2 12/24/21 1015 99 %     Weight 12/24/21 1012 199 lb 1.2 oz (90.3 kg)     Height 12/24/21 1012 '5\' 2"'$  (1.575 m)     Head Circumference --      Peak Flow --      Pain Score 12/24/21 1011 0     Pain Loc --      Pain Edu? --      Excl. in [place]? --    No data found.  Updated Vital Signs BP 133/67 (BP Location: Right Arm)   Pulse (!) 56   Temp 98.4 F (36.9 C) (Oral)   Resp 20   Ht '5\' 2"'$  (1.575 m)   Wt 199 lb 1.2 oz (90.3 kg)   SpO2 99%   BMI 36.41 kg/m   Visual Acuity Right Eye Distance:   Left Eye Distance:   Bilateral Distance:    Right Eye Near:   Left Eye Near:    Bilateral Near:     Physical Exam Vitals and nursing note reviewed.  Constitutional:      Appearance: Normal appearance. She is not ill-appearing.  HENT:     Head: Normocephalic and atraumatic.     Right Ear: Tympanic membrane, ear canal and external ear normal. There is no impacted cerumen.     Left Ear: Tympanic membrane, ear canal and external ear normal. There is no impacted cerumen.     Nose: Congestion present. No rhinorrhea.     Mouth/Throat:     Mouth: Mucous membranes are moist.     Pharynx: Oropharynx is clear. No oropharyngeal exudate or posterior oropharyngeal erythema.  Cardiovascular:     Rate and Rhythm: Normal rate and regular rhythm.     Pulses: Normal pulses.     Heart sounds: Murmur heard.     No friction rub. No gallop.  Pulmonary:     Effort: Pulmonary effort is normal.     Breath sounds: Wheezing present. No rhonchi or rales.  Musculoskeletal:     Cervical back: Normal range of motion and neck supple.  Lymphadenopathy:     Cervical: No cervical adenopathy.  Skin:    General: Skin is warm and dry.     Capillary Refill: Capillary refill takes  less than 2 seconds.  Neurological:     General: No focal deficit present.     Mental Status: She is alert and oriented to person, place, and time.  Psychiatric:        Mood and Affect: Mood normal.        Behavior: Behavior normal.        Thought Content: Thought content normal.        Judgment: Judgment normal.      UC Treatments / Results  Labs (all labs ordered are listed, but only abnormal results are displayed) Labs Reviewed  SARS CORONAVIRUS 2 BY  RT PCR    EKG Sinus bradycardia with ventricular rate of 57 bpm PR interval 194 ms QRS duration 86 ms QT/QTc 444/432 ms No ST or T wave abnormalities. There is mild change in the morphology of V4 and V5 when compared to 09/28/2017.   Radiology DG Chest 2 View  Result Date: 12/24/2021 CLINICAL DATA:  Short of breath and wheezing EXAM: CHEST - 2 VIEW COMPARISON:  CT chest 09/28/2017 FINDINGS: Heart size upper limits of normal.  Vascularity normal. Lungs are clear without infiltrate or effusion Mild thoracic disc degeneration without acute skeletal abnormality. Advanced degenerative change in both shoulders IMPRESSION: No active cardiopulmonary disease. Electronically Signed   By: Franchot Gallo M.D.   On: 12/24/2021 11:07    Procedures Procedures (including critical care time)  Medications Ordered in UC Medications - No data to display  Initial Impression / Assessment and Plan / UC Course  I have reviewed the triage vital signs and the nursing notes.  Pertinent labs & imaging results that were available during my care of the patient were reviewed by me and considered in my medical decision making (see chart for details).  Patient is a pleasant 81 year old female here for evaluation of cough that is productive for clear sputum, shortness of breath, wheezing, and chest tightness that began 4 days ago upon returning from a trip to [place] where she was on a cruise.  She has not had a fever and denies upper respiratory symptoms.   Her physical exam reveals pearly-gray tympanic membranes bilaterally with normal light reflex and clear external auditory canals.  Nasal mucosa is erythematous and mildly edematous with scant clear discharge in both nares.  Oropharyngeal exam is benign.  No cervical lymphadenopathy palpated on exam.  Cardiopulmonary exam reveals a systolic murmur when auscultating over the mitral valve.  Rate is regular.  Lung sounds reveal diffuse wheezing in all lung fields.  Patient does have mild dyspnea but is able to speak in full sentences.  SPO2 is 99% on room air.  Given her history of mitral insufficiency coupled with her DOE I will check EKG and chest x-ray.  We will also check COVID test as she was recently traveling.  EKG is reassuring showing sinus bradycardia with no ST or T wave abnormalities.  No significant change when compared to 09/28/2017 EKG.  COVID test is negative.  Radiology impression of chest x-ray states that heart size is within upper limits of normal, vascularity normal, lungs are clear without infiltrate or effusion.  Mild thoracic disc degeneration without acute skeletal abnormality.  Advanced degenerative changes of the shoulders.  No active cardiopulmonary disease.  The source of the patient's shortness of breath is unclear.  I will treat her wheezing with a mixture of albuterol and a short burst dose of prednisone.  I will also give her Tessalon Perles to use during the day for cough and some Promethazine DM cough syrup to use at bedtime.   Final Clinical Impressions(s) / UC Diagnoses   Final diagnoses:  Shortness of breath  Wheezing     Discharge Instructions      Your COVID test was negative and your EKG did not show any presence of pneumonia or other acute respiratory issues.  Your EKG was also normal.  It may be possible that you contracted a respiratory virus while on your cruise.  Use the albuterol inhaler with a spacer, 2 puffs every 4-6 hours, as needed for  shortness of breath and wheezing.  Take the prednisone  40 mg daily with breakfast for 5 days to help overcome inflammation in your lungs and aid in breathing relief.  Use the Tessalon Perles every 8 hours for the days needed for cough and use the Promethazine DM cough syrup at bedtime.  The cough syrup will make you drowsy.  If your symptoms not proved please return for reevaluation or see your PCP.  If you develop shortness of breath at rest, or unable to catch her breath, or develop chest pain please go to the ER for evaluation.     ED Prescriptions     Medication Sig Dispense Auth. Provider   albuterol (VENTOLIN HFA) 108 (90 Base) MCG/ACT inhaler Inhale 2 puffs into the lungs every 4 (four) hours as needed. 18 g Margarette Canada, NP   Spacer/Aero-Holding Chambers (AEROCHAMBER MV) inhaler Use as instructed 1 each Margarette Canada, NP   benzonatate (TESSALON) 100 MG capsule Take 2 capsules (200 mg total) by mouth every 8 (eight) hours. 21 capsule Margarette Canada, NP   predniSONE (DELTASONE) 20 MG tablet Take 2 tablets (40 mg total) by mouth daily with breakfast for 5 days. 3 tablets daily for 5 days. 10 tablet Margarette Canada, NP   promethazine-dextromethorphan (PROMETHAZINE-DM) 6.25-15 MG/5ML syrup Take 5 mLs by mouth 4 (four) times daily as needed. 118 mL Margarette Canada, NP      PDMP not reviewed this encounter.   Margarette Canada, NP 12/24/21 1116

## 2021-12-24 NOTE — Discharge Instructions (Addendum)
Your COVID test was negative and your EKG did not show any presence of pneumonia or other acute respiratory issues.  Your EKG was also normal.  It may be possible that you contracted a respiratory virus while on your cruise.  Use the albuterol inhaler with a spacer, 2 puffs every 4-6 hours, as needed for shortness of breath and wheezing.  Take the prednisone 40 mg daily with breakfast for 5 days to help overcome inflammation in your lungs and aid in breathing relief.  Use the Tessalon Perles every 8 hours for the days needed for cough and use the Promethazine DM cough syrup at bedtime.  The cough syrup will make you drowsy.  If your symptoms not proved please return for reevaluation or see your PCP.  If you develop shortness of breath at rest, or unable to catch her breath, or develop chest pain please go to the ER for evaluation.

## 2021-12-24 NOTE — ED Triage Notes (Signed)
Pt c/o shortness of breath and wheezing. Started this morning. She states she started having congestion about 4 day ago. She states she was recently on a cruise to [place]. She has taken 2 home covid test and were negative. Denies fever.

## 2021-12-28 ENCOUNTER — Ambulatory Visit
Admission: EM | Admit: 2021-12-28 | Discharge: 2021-12-28 | Disposition: A | Discharge status: Home / Self Care | Payer: Medicare PPO | Attending: Physician Assistant | Admitting: Physician Assistant

## 2021-12-28 ENCOUNTER — Ambulatory Visit (INDEPENDENT_AMBULATORY_CARE_PROVIDER_SITE_OTHER): Payer: Medicare PPO

## 2021-12-28 ENCOUNTER — Other Ambulatory Visit: Payer: Self-pay

## 2021-12-28 ENCOUNTER — Encounter: Payer: Self-pay | Admitting: Emergency Medicine

## 2021-12-28 DIAGNOSIS — R052 Subacute cough: Secondary | ICD-10-CM

## 2021-12-28 DIAGNOSIS — R059 Cough, unspecified: Secondary | ICD-10-CM

## 2021-12-28 DIAGNOSIS — R062 Wheezing: Secondary | ICD-10-CM | POA: Diagnosis not present

## 2021-12-28 MED ORDER — AZITHROMYCIN 250 MG PO TABS: ORAL_TABLET | ORAL | 0 refills | Status: DC

## 2021-12-28 MED ORDER — PREDNISONE 20 MG PO TABS: 40.0000 mg | ORAL_TABLET | Freq: Every day | ORAL | 0 refills | Status: AC

## 2021-12-28 NOTE — ED Triage Notes (Addendum)
Seen 12/24/2021.  Patient has not worsened.  Patient leaving on trip to [place] 12/30/2021.  Patient has finished prednisone today.  Has not used cough medicine.  Has not been coughing until today .  Patient reports not feeling more improved than what she is.  Patient is coughing today.  Minimal phlegm production and wheezing.

## 2021-12-28 NOTE — ED Provider Notes (Signed)
[facility]    CSN: [id number] Arrival date & time: 12/28/21  1026      History   Chief Complaint Chief Complaint  Patient presents with   Follow-up    HPI Barbara Hernandez is a 81 y.o. female.   Patient is a 81 year old female who presents with follow-up after being seen on July 6 for cough, dyspnea on exertion.  Patient completed her 5-day course of steroids today and states she has been using her inhaler every 4 hours.  She reports that she had not been using her cough medicine as she was not having much cough reports more of a cough today.  She feels like the steroids are helped but still has some wheezing.  Patient symptoms started after returning from a trip to [place] with her family.  Her further concern is that she has a trip planned to [place] for 2 weeks that is post to start 2 days from now on the 12th.    Past Medical History:  Diagnosis Date   Anemia    Arthritis    Barrett's esophagus 2012   2010 upper endoscopy showed only reflux. 2012 biopsies suggested Barrett's epithelial changes.   Bladder tumor    BPPV (benign paroxysmal positional vertigo) 2004   Cancer [facility]) 01/2018   bladder with low potential malignancy. tumor removed and did not require further treatment   Carotid artery narrowing 08/14/2014   Coronary artery disease 01/2018   sees Dr. Nehemiah Massed   Family history of adverse reaction to anesthesia    sister had memory problems after delivery of child   GERD (gastroesophageal reflux disease)    H/O measles    Heart murmur    Hematuria    Hematuria    Hemorrhoid    Hemorrhoids    History of chicken pox    Hyperlipidemia    Hypertension    Hypothyroidism    Joint pain    Low sodium levels    Mild left ventricular hypertrophy    Onychomycosis    Perforation of sigmoid colon due to diverticulitis 09/26/2017   Primary localized osteoarthritis of left knee 09/06/2017   Reflux    Reflux esophagitis    S/P total knee replacement, 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   left knee replacement 09/2017   Sleep apnea 2011   Uses C-Pap machine   Thyroid disease    Wears dentures    full upper and lower    Patient Active Problem List   Diagnosis Date Noted   S/P laparoscopic colectomy 02/13/2018   Recto-bladder neck fistula    Fistula of intestine, excluding rectum and anus    Preop examination    Obesity, unspecified 10/13/2017   Perforation of sigmoid colon due to diverticulitis 09/26/2017   Primary localized osteoarthritis of left knee 09/06/2017   Benign essential hypertension 03/18/2015   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   Arthritis, degenerative 11/22/2014   Hypothyroidism 10/08/2014   Barrett's esophagus 10/08/2014   Hypercholesteremia 10/08/2014   Hypertension 10/08/2014   Hematuria 10/08/2014   Mild left ventricular hypertrophy 10/08/2014   Pernicious anemia 10/08/2014   Vitamin D deficiency 10/08/2014   Vertigo, benign paroxysmal 40/98/1191   Systolic murmur 47/82/9562   Sleep apnea 10/08/2014   Arthritis of shoulder region, right, degenerative 10/08/2014   Allergic rhinitis 10/08/2014   Carotid artery narrowing 08/14/2014   MI (mitral incompetence) 08/14/2014   Esophageal reflux 09/07/2012   Hemorrhoid     Past Surgical History:  Procedure Laterality Date   BLEPHAROPLASTY Bilateral 2015  BREAST EXCISIONAL BIOPSY Left 1988   benign   COLONOSCOPY W/ BIOPSIES N/A 01/16/2013   No source for GI blood loss, mild lymphatic prominence in the rectum.   COLONOSCOPY WITH PROPOFOL N/A 01/20/2018   Procedure: COLONOSCOPY WITH PROPOFOL;  Surgeon: Lucilla Lame, MD;  Location: [place];  Service: Endoscopy;  Laterality: N/A;  sleep apnea   ESOPHAGOGASTRODUODENOSCOPY (EGD) WITH PROPOFOL N/A 03/15/2016   Procedure: ESOPHAGOGASTRODUODENOSCOPY (EGD) WITH PROPOFOL;  Surgeon: Robert Bellow, MD;  Location: [facility];  Service: Endoscopy;  Laterality: N/A;   ESOPHAGOGASTRODUODENOSCOPY (EGD) WITH PROPOFOL N/A 05/11/2018    Procedure: ESOPHAGOGASTRODUODENOSCOPY (EGD) WITH PROPOFOL;  Surgeon: Lucilla Lame, MD;  Location: [place];  Service: Endoscopy;  Laterality: N/A;  sleep apnea   EYE SURGERY Bilateral 2006   cataract   LAPAROSCOPIC SIGMOID COLECTOMY N/A 02/13/2018   Procedure: LAPAROSCOPIC SIGMOID COLECTOMY;  Surgeon: Jules Husbands, MD;  Location: [facility];  Service: General;  Laterality: N/A;   Left wrist fracture Left 1980   pins removed   POPLITEAL SYNOVIAL CYST EXCISION Right    TOTAL KNEE ARTHROPLASTY Right 08/09/2016   Procedure: TOTAL KNEE ARTHROPLASTY;  Surgeon: Elsie Saas, MD;  Location: [place];  Service: Orthopedics;  Laterality: Right;   TOTAL KNEE ARTHROPLASTY Left 09/19/2017   Procedure: TOTAL KNEE ARTHROPLASTY;  Surgeon: Elsie Saas, MD;  Location: [place];  Service: Orthopedics;  Laterality: Left;   TRANSURETHRAL RESECTION OF BLADDER TUMOR N/A 11/20/2014   Procedure: TRANSURETHRAL RESECTION OF BLADDER TUMOR (TURBT);  Surgeon: Hollice Espy, MD;  Location: [facility];  Service: Urology;  Laterality: N/A;   UPPER GI ENDOSCOPY  2012, 2014    OB History     Gravida  2   Para  2   Term      Preterm      AB      Living  2      SAB      IAB      Ectopic      Multiple      Live Births           Obstetric Comments  Age at first menstrual period 48 Age at first pregnancy 59          Home Medications    Prior to Admission medications   Medication Sig Start Date End Date Taking? Authorizing Provider  azithromycin (ZITHROMAX Z-PAK) 250 MG tablet Take as directed on package 12/28/21  Yes Luvenia Redden, PA-C  predniSONE (DELTASONE) 20 MG tablet Take 2 tablets (40 mg total) by mouth daily for 5 days. 12/28/21 01/02/22 Yes Luvenia Redden, PA-C  albuterol (VENTOLIN HFA) 108 (90 Base) MCG/ACT inhaler Inhale 2 puffs into the lungs every 4 (four) hours as needed. 12/24/21   Margarette Canada, NP  amLODipine (NORVASC) 5 MG tablet Take by mouth. 02/15/20 12/24/21  [provider]  Artificial Tear Solution (GENTEAL TEARS OP) Apply 1 application to eye at bedtime.    [provider]  aspirin EC 81 MG tablet Take 81 mg by mouth at bedtime.     [provider]  benzonatate (TESSALON) 100 MG capsule Take 2 capsules (200 mg total) by mouth every 8 (eight) hours. 12/24/21   Margarette Canada, NP  Calcium Carbonate-Vit D-Min (CALCIUM 1200 PO) Take 1 tablet by mouth daily.     [provider]  carvedilol (COREG) 3.125 MG tablet Take by mouth. 04/15/20 12/24/21  [provider]  celecoxib (CELEBREX) 200 MG capsule Take 200 mg  by mouth daily.     [provider]  Cholecalciferol (VITAMIN D3) 2000 UNITS TABS Take 2,000 Units by mouth every evening.     [provider]  Docusate Sodium (DSS) 100 MG CAPS Take by mouth.    [provider]  levothyroxine (SYNTHROID, LEVOTHROID) 125 MCG tablet Take 1 tablet (125 mcg total) by mouth daily. 09/11/15   Margarita Rana, MD  lisinopril (PRINIVIL,ZESTRIL) 40 MG tablet Take 1 tablet (40 mg total) by mouth daily. bedtime Patient taking differently: Take 40 mg by mouth daily. 05/13/15   Margarita Rana, MD  Multiple Vitamin (MULTIVITAMIN WITH MINERALS) TABS tablet Take 1 tablet by mouth daily.    [provider]  omeprazole (PRILOSEC) 20 MG capsule Take 1 capsule (20 mg total) by mouth daily. 04/15/15   Margarita Rana, MD  promethazine-dextromethorphan (PROMETHAZINE-DM) 6.25-15 MG/5ML syrup Take 5 mLs by mouth 4 (four) times daily as needed. 12/24/21   Margarette Canada, NP  simvastatin (ZOCOR) 20 MG tablet Take 1 tablet (20 mg total) by mouth daily. bedtime 05/13/15   Margarita Rana, MD  Spacer/Aero-Holding Chambers (AEROCHAMBER MV) inhaler Use as instructed 12/24/21   Margarette Canada, NP  vitamin B-12 (CYANOCOBALAMIN) 1000 MCG tablet Take 1,000 mcg by mouth daily.    [provider]    Family History Family History  Problem Relation Age of Onset   Heart disease Mother         CHF, CAD   Hypertension Mother    Mental illness Mother        Dementia   Cancer Mother        breast, Brain Cancer   Breast cancer Mother 75   Cancer Father        lung   Heart disease Father        MI   Heart disease Sister    Cancer Sister        Breast   Barrett's esophagus Sister    Hyperlipidemia Sister    Breast cancer Sister 89   Asthma Brother    Heart disease Brother    Diabetes Brother    Anxiety disorder Brother    Neuropathy Brother    Neuropathy Sister    Anxiety disorder Sister    Mental illness Sister        Dementia   Fibromyalgia Sister     Social History Social History   Tobacco Use   Smoking status: Former    Packs/day: 0.50    Years: 20.00    Total pack years: 10.00    Types: Cigarettes    Quit date: 06/20/1977    Years since quitting: 44.5   Smokeless tobacco: Never  Vaping Use   Vaping Use: Never used  Substance Use Topics   Alcohol use: Yes    Alcohol/week: 1.0 - 3.0 standard drink of alcohol    Types: 1 - 2 Standard drinks or equivalent per week    Comment: occasional   Drug use: No     Allergies   Carvedilol, Hctz [hydrochlorothiazide], and Spironolactone   Review of Systems Review of Systems as above in HPI.  Other systems reviewed and found to be negative   Physical Exam Triage Vital Signs ED Triage Vitals  Enc Vitals Group     BP 12/28/21 1101 (!) 157/75     Pulse Rate 12/28/21 1101 (!) 51     Resp 12/28/21 1101 20     Temp 12/28/21 1101 98.6 F (37 C)     Temp  Source 12/28/21 1101 Oral     SpO2 12/28/21 1101 97 %     Weight --      Height --      Head Circumference --      Peak Flow --      Pain Score 12/28/21 1057 0     Pain Loc --      Pain Edu? --      Excl. in [place]? --    No data found.  Updated Vital Signs BP (!) 157/75 (BP Location: Left Arm) Comment (BP Location): large cuff  Pulse (!) 51   Temp 98.6 F (37 C) (Oral)   Resp 20   SpO2 97%     Physical Exam Constitutional:       Appearance: Normal appearance.     Comments: Frequent raspy cough  HENT:     Right Ear: Tympanic membrane normal.     Left Ear: Tympanic membrane normal.  Cardiovascular:     Rate and Rhythm: Bradycardia present.     Heart sounds: Murmur heard.  Pulmonary:     Comments: Diffuse wheezing in all 4 lung fields, worse in the upper lung fields.  Frequent raspy cough with expiration.  Neurological:     General: No focal deficit present.     Mental Status: She is alert and oriented to person, place, and time.      UC Treatments / Results  Labs (all labs ordered are listed, but only abnormal results are displayed) Labs Reviewed - No data to display  EKG   Radiology DG Chest 2 View  Result Date: 12/28/2021 CLINICAL DATA:  History of cough. EXAM: CHEST - 2 VIEW COMPARISON:  12/24/2021. FINDINGS: Mediastinum and hilar structures normal. Heart size normal. No focal infiltrate. No pleural effusion or pneumothorax. Severe degenerative changes both shoulders again noted. Degenerative changes thoracic spine again noted. Abdominal aortic atherosclerotic vascular calcification noted. IMPRESSION: No acute cardiopulmonary disease. Electronically Signed   By: Marcello Moores  Register M.D.   On: 12/28/2021 11:20    Procedures Procedures (including critical care time)  Medications Ordered in UC Medications - No data to display  Initial Impression / Assessment and Plan / UC Course  I have reviewed the triage vital signs and the nursing notes.  Pertinent labs & imaging results that were available during my care of the patient were reviewed by me and considered in my medical decision making (see chart for details).    Patient returns with cough that she says is more frequent and prior but continues to have some wheezing despite completing the 5 days of prednisone 40 in the inhaler that she is using every 4 hours.  Chest x-ray is clear.  COVID was negative last week.  Frequent cough with expiration as well  as wheezing diffusely, upper airways worse.  Patient discussed with Dr. Lanny Cramp.  We will repeat her course of prednisone and give her a course of azithromycin as well.  Encouraged her to use the [employer] as well as a promethazine for her cough.  Chance that this could be postviral and will take her a while to get over this.  Should be safe for her to go on her trip. Final Clinical Impressions(s) / UC Diagnoses   Final diagnoses:  Subacute cough  Wheezing     Discharge Instructions      -Repeat course of prednisone, 40 mg daily for 5 days -Azithromycin: Take as directed on packaging -Continue use of the albuterol, Tessalon, cough syrup as needed -  Push fluid intake -Make sure to keep medications in the original packaging for your trip.     ED Prescriptions     Medication Sig Dispense Auth. Provider   azithromycin (ZITHROMAX Z-PAK) 250 MG tablet Take as directed on package 6 tablet Luvenia Redden, PA-C   predniSONE (DELTASONE) 20 MG tablet Take 2 tablets (40 mg total) by mouth daily for 5 days. 10 tablet Luvenia Redden, PA-C      PDMP not reviewed this encounter.   Luvenia Redden, PA-C 12/28/21 1139

## 2021-12-28 NOTE — Discharge Instructions (Addendum)
-  Repeat course of prednisone, 40 mg daily for 5 days -Azithromycin: Take as directed on packaging -Continue use of the albuterol, Tessalon, cough syrup as needed -Push fluid intake -Make sure to keep medications in the original packaging for your trip.

## 2022-01-12 ENCOUNTER — Ambulatory Visit (INDEPENDENT_AMBULATORY_CARE_PROVIDER_SITE_OTHER): Payer: Medicare PPO

## 2022-01-12 ENCOUNTER — Ambulatory Visit
Admission: RE | Admit: 2022-01-12 | Discharge: 2022-01-12 | Disposition: A | Discharge status: Home / Self Care | Payer: Medicare PPO | Source: Ambulatory Visit | Attending: Internal Medicine | Admitting: Internal Medicine

## 2022-01-12 VITALS — BP 114/63 | HR 62 | Temp 98.3°F | Resp 18

## 2022-01-12 DIAGNOSIS — M25552 Pain in left hip: Secondary | ICD-10-CM

## 2022-01-12 DIAGNOSIS — R102 Pelvic and perineal pain: Secondary | ICD-10-CM

## 2022-01-12 DIAGNOSIS — R0681 Apnea, not elsewhere classified: Secondary | ICD-10-CM

## 2022-01-12 MED ORDER — ALBUTEROL SULFATE HFA 108 (90 BASE) MCG/ACT IN AERS: 2.0000 | INHALATION_SPRAY | RESPIRATORY_TRACT | 0 refills | Status: AC | PRN

## 2022-01-12 MED ORDER — DICLOFENAC SODIUM 1 % EX GEL: 2.0000 g | Freq: Four times a day (QID) | CUTANEOUS | 0 refills | Status: AC | PRN

## 2022-01-12 NOTE — ED Provider Notes (Signed)
[facility]    CSN: [id number] Arrival date & time: 01/12/22  1201      History   Chief Complaint Chief Complaint  Patient presents with   Groin Pain    I have muscle pain in the top of my inner thigh. This has been for the last six days. - Entered by patient    HPI Barbara Hernandez is a 81 y.o. female.  She presents today with pain in the proximal left thigh, started about a week ago when she was walking on steep/hilly terrain in [place].  No fall. Was seen at the urgent care on July 6 and July 10, after a trip to [place]; she was having some wheezing and a sensation of breathlessness, and was thought to have a respiratory infection.  Prednisone and an inhaler were helpful.  Patient has had some dyspnea on exertion for hills and steps in the past, and noticed this when she was hiking in [place] recently.  The inhaler seemed helpful, and she would like a refill. She has an appointment with her primary care provider in August, and a cardiology follow-up in September.  She uses a CPAP at night, does not smoke, and has not seen a pulmonologist before.   Groin Pain    Past Medical History:  Diagnosis Date   Anemia    Arthritis    Barrett's esophagus 2012   2010 upper endoscopy showed only reflux. 2012 biopsies suggested Barrett's epithelial changes.   Bladder tumor    BPPV (benign paroxysmal positional vertigo) 2004   Cancer [facility]) 01/2018   bladder with low potential malignancy. tumor removed and did not require further treatment   Carotid artery narrowing 08/14/2014   Coronary artery disease 01/2018   sees Dr. Nehemiah Massed   Family history of adverse reaction to anesthesia    sister had memory problems after delivery of child   GERD (gastroesophageal reflux disease)    H/O measles    Heart murmur    Hematuria    Hematuria    Hemorrhoid    Hemorrhoids    History of chicken pox    Hyperlipidemia    Hypertension    Hypothyroidism    Joint pain    Low  sodium levels    Mild left ventricular hypertrophy    Onychomycosis    Perforation of sigmoid colon due to diverticulitis 09/26/2017   Primary localized osteoarthritis of left knee 09/06/2017   Reflux    Reflux esophagitis    S/P total knee replacement, 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   left knee replacement 09/2017   Sleep apnea 2011   Uses C-Pap machine   Thyroid disease    Wears dentures    full upper and lower    Patient Active Problem List   Diagnosis Date Noted   S/P laparoscopic colectomy 02/13/2018   Recto-bladder neck fistula    Fistula of intestine, excluding rectum and anus    Preop examination    Obesity, unspecified 10/13/2017   Perforation of sigmoid colon due to diverticulitis 09/26/2017   Primary localized osteoarthritis of left knee 09/06/2017   Benign essential hypertension 03/18/2015   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   Arthritis, degenerative 11/22/2014   Hypothyroidism 10/08/2014   Barrett's esophagus 10/08/2014   Hypercholesteremia 10/08/2014   Hypertension 10/08/2014   Hematuria 10/08/2014   Mild left ventricular hypertrophy 10/08/2014   Pernicious anemia 10/08/2014   Vitamin D deficiency 10/08/2014   Vertigo, benign paroxysmal 76/72/0947   Systolic murmur 09/62/8366   Sleep apnea 10/08/2014  Arthritis of shoulder region, right, degenerative 10/08/2014   Allergic rhinitis 10/08/2014   Carotid artery narrowing 08/14/2014   MI (mitral incompetence) 08/14/2014   Esophageal reflux 09/07/2012   Hemorrhoid     Past Surgical History:  Procedure Laterality Date   BLEPHAROPLASTY Bilateral 2015   BREAST EXCISIONAL BIOPSY Left 1988   benign   COLONOSCOPY W/ BIOPSIES N/A 01/16/2013   No source for GI blood loss, mild lymphatic prominence in the rectum.   COLONOSCOPY WITH PROPOFOL N/A 01/20/2018   Procedure: COLONOSCOPY WITH PROPOFOL;  Surgeon: Lucilla Lame, MD;  Location: [place];  Service: Endoscopy;  Laterality: N/A;  sleep apnea   ESOPHAGOGASTRODUODENOSCOPY  (EGD) WITH PROPOFOL N/A 03/15/2016   Procedure: ESOPHAGOGASTRODUODENOSCOPY (EGD) WITH PROPOFOL;  Surgeon: Robert Bellow, MD;  Location: [facility];  Service: Endoscopy;  Laterality: N/A;   ESOPHAGOGASTRODUODENOSCOPY (EGD) WITH PROPOFOL N/A 05/11/2018   Procedure: ESOPHAGOGASTRODUODENOSCOPY (EGD) WITH PROPOFOL;  Surgeon: Lucilla Lame, MD;  Location: [place];  Service: Endoscopy;  Laterality: N/A;  sleep apnea   EYE SURGERY Bilateral 2006   cataract   LAPAROSCOPIC SIGMOID COLECTOMY N/A 02/13/2018   Procedure: LAPAROSCOPIC SIGMOID COLECTOMY;  Surgeon: Jules Husbands, MD;  Location: [facility];  Service: General;  Laterality: N/A;   Left wrist fracture Left 1980   pins removed   POPLITEAL SYNOVIAL CYST EXCISION Right    TOTAL KNEE ARTHROPLASTY Right 08/09/2016   Procedure: TOTAL KNEE ARTHROPLASTY;  Surgeon: Elsie Saas, MD;  Location: [place];  Service: Orthopedics;  Laterality: Right;   TOTAL KNEE ARTHROPLASTY Left 09/19/2017   Procedure: TOTAL KNEE ARTHROPLASTY;  Surgeon: Elsie Saas, MD;  Location: [place];  Service: Orthopedics;  Laterality: Left;   TRANSURETHRAL RESECTION OF BLADDER TUMOR N/A 11/20/2014   Procedure: TRANSURETHRAL RESECTION OF BLADDER TUMOR (TURBT);  Surgeon: Hollice Espy, MD;  Location: [facility];  Service: Urology;  Laterality: N/A;   UPPER GI ENDOSCOPY  2012, 2014    OB History     Gravida  2   Para  2   Term      Preterm      AB      Living  2      SAB      IAB      Ectopic      Multiple      Live Births           Obstetric Comments  Age at first menstrual period 58 Age at first pregnancy 78          Home Medications    Prior to Admission medications   Medication Sig Start Date End Date Taking? Authorizing Provider  albuterol (VENTOLIN HFA) 108 (90 Base) MCG/ACT inhaler Inhale 2 puffs into the lungs every 4 (four) hours as needed. 12/24/21   Margarette Canada, NP  amLODipine (NORVASC) 5 MG tablet Take by mouth. 02/15/20 12/24/21   [provider]  Artificial Tear Solution (GENTEAL TEARS OP) Apply 1 application to eye at bedtime.    [provider]  aspirin EC 81 MG tablet Take 81 mg by mouth at bedtime.     [provider]  azithromycin (ZITHROMAX Z-PAK) 250 MG tablet Take as directed on package 12/28/21   Luvenia Redden, PA-C  benzonatate (TESSALON) 100 MG capsule Take 2 capsules (200 mg total) by mouth every 8 (eight) hours. 12/24/21   Margarette Canada, NP  Calcium Carbonate-Vit D-Min (CALCIUM 1200 PO) Take 1 tablet by mouth daily.     [provider]  carvedilol (COREG) 3.125 MG tablet Take by mouth. 04/15/20 12/24/21  [provider]  celecoxib (CELEBREX) 200 MG capsule Take 200 mg by mouth daily.     [provider]  Cholecalciferol (VITAMIN D3) 2000 UNITS TABS Take 2,000 Units by mouth every evening.     [provider]  Docusate Sodium (DSS) 100 MG CAPS Take by mouth.    [provider]  levothyroxine (SYNTHROID, LEVOTHROID) 125 MCG tablet Take 1 tablet (125 mcg total) by mouth daily. 09/11/15   Margarita Rana, MD  lisinopril (PRINIVIL,ZESTRIL) 40 MG tablet Take 1 tablet (40 mg total) by mouth daily. bedtime Patient taking differently: Take 40 mg by mouth daily. 05/13/15   Margarita Rana, MD  Multiple Vitamin (MULTIVITAMIN WITH MINERALS) TABS tablet Take 1 tablet by mouth daily.    [provider]  omeprazole (PRILOSEC) 20 MG capsule Take 1 capsule (20 mg total) by mouth daily. 04/15/15   Margarita Rana, MD  promethazine-dextromethorphan (PROMETHAZINE-DM) 6.25-15 MG/5ML syrup Take 5 mLs by mouth 4 (four) times daily as needed. 12/24/21   Margarette Canada, NP  simvastatin (ZOCOR) 20 MG tablet Take 1 tablet (20 mg total) by mouth daily. bedtime 05/13/15   Margarita Rana, MD  Spacer/Aero-Holding Chambers (AEROCHAMBER MV) inhaler Use as instructed 12/24/21   Margarette Canada, NP  vitamin B-12 (CYANOCOBALAMIN) 1000 MCG tablet Take 1,000 mcg by mouth daily.     [provider]    Family History Family History  Problem Relation Age of Onset   Heart disease Mother        CHF, CAD   Hypertension Mother    Mental illness Mother        Dementia   Cancer Mother        breast, Brain Cancer   Breast cancer Mother 33   Cancer Father        lung   Heart disease Father        MI   Heart disease Sister    Cancer Sister        Breast   Barrett's esophagus Sister    Hyperlipidemia Sister    Breast cancer Sister 82   Asthma Brother    Heart disease Brother    Diabetes Brother    Anxiety disorder Brother    Neuropathy Brother    Neuropathy Sister    Anxiety disorder Sister    Mental illness Sister        Dementia   Fibromyalgia Sister     Social History Social History   Tobacco Use   Smoking status: Former    Packs/day: 0.50    Years: 20.00    Total pack years: 10.00    Types: Cigarettes    Quit date: 06/20/1977    Years since quitting: 44.5   Smokeless tobacco: Never  Vaping Use   Vaping Use: Never used  Substance Use Topics   Alcohol use: Yes    Alcohol/week: 1.0 - 3.0 standard drink of alcohol    Types: 1 - 2 Standard drinks or equivalent per week    Comment: occasional   Drug use: No     Allergies   Carvedilol, Hctz [hydrochlorothiazide], and Spironolactone   Review of Systems Review of Systems   Physical Exam Triage Vital Signs ED Triage Vitals  Enc Vitals Group     BP 01/12/22 1225 114/63     Pulse Rate 01/12/22 1225 62     Resp 01/12/22 1225 18     Temp 01/12/22  1225 98.3 F (36.8 C)     Temp Source 01/12/22 1225 Oral     SpO2 01/12/22 1225 99 %     Weight --      Height --      Head Circumference --      Peak Flow --      Pain Score 01/12/22 1223 0     Pain Loc --      Pain Edu? --      Excl. in [place]? --    No data found.  Updated Vital Signs BP 114/63 (BP Location: Right Wrist)   Pulse 62   Temp 98.3 F (36.8 C) (Oral)   Resp 18   SpO2 99%   Visual Acuity Right Eye Distance:    Left Eye Distance:   Bilateral Distance:    Right Eye Near:   Left Eye Near:    Bilateral Near:     Physical Exam Constitutional:      General: She is not in acute distress.    Appearance: She is not ill-appearing.     Comments: Good hygiene  HENT:     Head: Atraumatic.     Comments: Bilateral TMs are mildly dull, right ear is waxy Moderate nasal congestion with mucousy material present particularly on the right Posterior pharynx is mildly injected    Mouth/Throat:     Mouth: Mucous membranes are moist.  Eyes:     Conjunctiva/sclera:     Right eye: Right conjunctiva is not injected. No exudate.    Left eye: Left conjunctiva is not injected. No exudate.    Comments: Conjugate gaze observed  Cardiovascular:     Rate and Rhythm: Normal rate and regular rhythm.  Pulmonary:     Effort: Pulmonary effort is normal. No respiratory distress.     Breath sounds: No wheezing or rhonchi.     Comments: Slight coarseness to breath sounds throughout Abdominal:     General: There is no distension.  Musculoskeletal:     Cervical back: Neck supple.       Legs:     Comments: Walked into the urgent care with the help of a cane.  Able to flex left and right hip symmetrically, without pain.  Stand from sitting is not uncomfortable, but putting weight on the left leg after rising is.  Left hip AB and adduction is painful Painful site is marked in purple on diagram; not tender to palpation  Neurological:     Mental Status: She is alert.      UC Treatments / Results  Labs (all labs ordered are listed, but only abnormal results are displayed) Labs Reviewed - No data to display  EKG   Radiology DG Hip Unilat With Pelvis 2-3 Views Left  Result Date: 01/12/2022 CLINICAL DATA:  Left hip and groin pain EXAM: DG HIP (WITH OR WITHOUT PELVIS) 3V LEFT COMPARISON:  None Available. FINDINGS: No evidence of hip fracture or dislocation. Mild degenerative changes of the bilateral hips and pubic  symphysis. Soft tissues are unremarkable. IMPRESSION: No acute osseous abnormality. Electronically Signed   By: Yetta Glassman M.D.   On: 01/12/2022 13:31    Procedures Procedures (including critical care time)  Medications Ordered in UC Medications - No data to display  Initial Impression / Assessment and Plan / UC Course  I have reviewed the triage vital signs and the nursing notes.  Pertinent labs & imaging results that were available during my care of the patient were reviewed by me  and considered in my medical decision making (see chart for details).     *** Final Clinical Impressions(s) / UC Diagnoses   Final diagnoses:  None   Discharge Instructions   None    ED Prescriptions   None    PDMP not reviewed this encounter.

## 2022-01-12 NOTE — ED Triage Notes (Signed)
Pt c/o left hip/groin pain x 6 days. Pt was in [place] and doing a lot of walking.   Pt was seen 7/10 for wheezing and SOB she has used her inhaler as directed while on vacation, but she continues to have trouble with her breathing.

## 2022-01-12 NOTE — Discharge Instructions (Signed)
Pain in left groin is consistent with a groin muscle pull.  Xray of your left hip was negative for bony injury/fracture.  Prescription for diclofenac gel (anti inflammatory/pain reliever) was sent to the pharmacy to try for pain relief.  Ice or heat to the area for 10-15 minutes several times daily may also help decrease pain while the soft tissues heal over the next week or two.  Physical therapy may provide additional benefit if symptoms are persistent.   Refill of albuterol inhaler with spacer was sent to the pharmacy.  Breathlessness with exertion (steps and hills) is fairly long standing, but may warrant further investigation with your heart doctor or a lung doctor, if getting significantly worse.

## 2022-04-21 ENCOUNTER — Other Ambulatory Visit
Admission: RE | Admit: 2022-04-21 | Discharge: 2022-04-21 | Disposition: A | Discharge status: Home / Self Care | Payer: Medicare PPO | Source: Ambulatory Visit | Attending: Sports Medicine | Admitting: Sports Medicine

## 2022-04-21 DIAGNOSIS — M1611 Unilateral primary osteoarthritis, right hip: Secondary | ICD-10-CM | POA: Diagnosis present

## 2022-04-21 DIAGNOSIS — M25551 Pain in right hip: Secondary | ICD-10-CM | POA: Insufficient documentation

## 2022-04-21 DIAGNOSIS — G8929 Other chronic pain: Secondary | ICD-10-CM | POA: Insufficient documentation

## 2022-04-21 DIAGNOSIS — M25451 Effusion, right hip: Secondary | ICD-10-CM | POA: Insufficient documentation

## 2022-04-21 LAB — SYNOVIAL CELL COUNT + DIFF, W/ CRYSTALS
Crystals, Fluid: NONE SEEN
Eosinophils-Synovial: 0 %
Lymphocytes-Synovial Fld: 4 %
Monocyte-Macrophage-Synovial Fluid: 58 %
Neutrophil, Synovial: 38 %
WBC, Synovial: 46 /mm3 (ref 0–200)

## 2022-09-21 ENCOUNTER — Other Ambulatory Visit: Payer: Self-pay | Admitting: Gerontology

## 2022-09-21 ENCOUNTER — Other Ambulatory Visit: Payer: Self-pay

## 2022-09-21 DIAGNOSIS — Z1231 Encounter for screening mammogram for malignant neoplasm of breast: Secondary | ICD-10-CM

## 2022-09-22 ENCOUNTER — Encounter: Payer: Self-pay | Admitting: Gerontology

## 2022-09-24 ENCOUNTER — Other Ambulatory Visit: Payer: Self-pay | Admitting: Gerontology

## 2022-09-24 DIAGNOSIS — Z0000 Encounter for general adult medical examination without abnormal findings: Secondary | ICD-10-CM

## 2022-09-24 DIAGNOSIS — R0602 Shortness of breath: Secondary | ICD-10-CM

## 2022-09-30 ENCOUNTER — Encounter: Payer: Self-pay | Admitting: Gerontology

## 2022-10-01 ENCOUNTER — Encounter: Payer: Self-pay | Admitting: Gerontology

## 2022-10-04 ENCOUNTER — Ambulatory Visit
Admission: RE | Admit: 2022-10-04 | Discharge: 2022-10-04 | Disposition: A | Discharge status: Home / Self Care | Payer: Medicare PPO | Source: Ambulatory Visit | Attending: Gerontology | Admitting: Gerontology

## 2022-10-04 DIAGNOSIS — Z0000 Encounter for general adult medical examination without abnormal findings: Secondary | ICD-10-CM

## 2022-10-04 DIAGNOSIS — R0602 Shortness of breath: Secondary | ICD-10-CM

## 2022-10-04 MED ORDER — IOPAMIDOL (ISOVUE-300) INJECTION 61%
75.0000 mL | Freq: Once | INTRAVENOUS | Status: AC | PRN
Start: 2022-10-04 — End: 2022-10-04
  Administered 2022-10-04: 75 mL via INTRAVENOUS

## 2022-10-13 ENCOUNTER — Other Ambulatory Visit: Payer: Self-pay | Admitting: Gerontology

## 2022-10-13 DIAGNOSIS — K7689 Other specified diseases of liver: Secondary | ICD-10-CM

## 2022-10-13 DIAGNOSIS — R160 Hepatomegaly, not elsewhere classified: Secondary | ICD-10-CM

## 2022-10-26 ENCOUNTER — Encounter: Payer: Self-pay | Admitting: Gerontology

## 2022-10-29 ENCOUNTER — Ambulatory Visit
Admission: RE | Admit: 2022-10-29 | Discharge: 2022-10-29 | Disposition: A | Discharge status: Home / Self Care | Payer: Medicare PPO | Source: Ambulatory Visit | Attending: Gerontology | Admitting: Gerontology

## 2022-10-29 DIAGNOSIS — R160 Hepatomegaly, not elsewhere classified: Secondary | ICD-10-CM

## 2022-10-29 DIAGNOSIS — K7689 Other specified diseases of liver: Secondary | ICD-10-CM

## 2022-10-29 MED ORDER — GADOPICLENOL 0.5 MMOL/ML IV SOLN
9.0000 mL | Freq: Once | INTRAVENOUS | Status: AC | PRN
  Administered 2022-10-29: 9 mL via INTRAVENOUS

## 2022-11-18 ENCOUNTER — Ambulatory Visit
Admission: RE | Admit: 2022-11-18 | Discharge: 2022-11-18 | Disposition: A | Discharge status: Home / Self Care | Payer: Medicare PPO | Source: Ambulatory Visit | Attending: Gerontology | Admitting: Gerontology

## 2022-11-18 DIAGNOSIS — Z1231 Encounter for screening mammogram for malignant neoplasm of breast: Secondary | ICD-10-CM | POA: Insufficient documentation

## 2022-12-03 DIAGNOSIS — M1612 Unilateral primary osteoarthritis, left hip: Secondary | ICD-10-CM | POA: Diagnosis not present

## 2022-12-10 DIAGNOSIS — G4733 Obstructive sleep apnea (adult) (pediatric): Secondary | ICD-10-CM | POA: Diagnosis not present

## 2022-12-16 DIAGNOSIS — E039 Hypothyroidism, unspecified: Secondary | ICD-10-CM | POA: Diagnosis not present

## 2022-12-21 ENCOUNTER — Emergency Department: Payer: Medicare PPO

## 2022-12-21 ENCOUNTER — Other Ambulatory Visit: Payer: Self-pay

## 2022-12-21 ENCOUNTER — Encounter: Payer: Self-pay | Admitting: Emergency Medicine

## 2022-12-21 ENCOUNTER — Inpatient Hospital Stay
Admission: EM | Admit: 2022-12-21 | Discharge: 2022-12-23 | DRG: 522 | Disposition: A | Discharge status: Home Health | Payer: Medicare PPO | Attending: Internal Medicine | Admitting: Internal Medicine

## 2022-12-21 DIAGNOSIS — Z888 Allergy status to other drugs, medicaments and biological substances status: Secondary | ICD-10-CM

## 2022-12-21 DIAGNOSIS — Z803 Family history of malignant neoplasm of breast: Secondary | ICD-10-CM

## 2022-12-21 DIAGNOSIS — E785 Hyperlipidemia, unspecified: Secondary | ICD-10-CM | POA: Diagnosis present

## 2022-12-21 DIAGNOSIS — Z6836 Body mass index (BMI) 36.0-36.9, adult: Secondary | ICD-10-CM | POA: Diagnosis not present

## 2022-12-21 DIAGNOSIS — M879 Osteonecrosis, unspecified: Secondary | ICD-10-CM | POA: Diagnosis present

## 2022-12-21 DIAGNOSIS — S72009A Fracture of unspecified part of neck of unspecified femur, initial encounter for closed fracture: Secondary | ICD-10-CM | POA: Diagnosis present

## 2022-12-21 DIAGNOSIS — Z96641 Presence of right artificial hip joint: Secondary | ICD-10-CM | POA: Diagnosis present

## 2022-12-21 DIAGNOSIS — Z87891 Personal history of nicotine dependence: Secondary | ICD-10-CM | POA: Diagnosis not present

## 2022-12-21 DIAGNOSIS — Z818 Family history of other mental and behavioral disorders: Secondary | ICD-10-CM

## 2022-12-21 DIAGNOSIS — E669 Obesity, unspecified: Secondary | ICD-10-CM | POA: Diagnosis present

## 2022-12-21 DIAGNOSIS — E039 Hypothyroidism, unspecified: Secondary | ICD-10-CM | POA: Diagnosis present

## 2022-12-21 DIAGNOSIS — M1612 Unilateral primary osteoarthritis, left hip: Secondary | ICD-10-CM | POA: Diagnosis not present

## 2022-12-21 DIAGNOSIS — I2510 Atherosclerotic heart disease of native coronary artery without angina pectoris: Secondary | ICD-10-CM | POA: Diagnosis present

## 2022-12-21 DIAGNOSIS — R9431 Abnormal electrocardiogram [ECG] [EKG]: Secondary | ICD-10-CM | POA: Diagnosis not present

## 2022-12-21 DIAGNOSIS — Z833 Family history of diabetes mellitus: Secondary | ICD-10-CM

## 2022-12-21 DIAGNOSIS — S72002A Fracture of unspecified part of neck of left femur, initial encounter for closed fracture: Principal | ICD-10-CM

## 2022-12-21 DIAGNOSIS — K219 Gastro-esophageal reflux disease without esophagitis: Secondary | ICD-10-CM | POA: Diagnosis present

## 2022-12-21 DIAGNOSIS — Z96642 Presence of left artificial hip joint: Secondary | ICD-10-CM | POA: Diagnosis not present

## 2022-12-21 DIAGNOSIS — J449 Chronic obstructive pulmonary disease, unspecified: Secondary | ICD-10-CM | POA: Diagnosis present

## 2022-12-21 DIAGNOSIS — Z808 Family history of malignant neoplasm of other organs or systems: Secondary | ICD-10-CM

## 2022-12-21 DIAGNOSIS — S72052A Unspecified fracture of head of left femur, initial encounter for closed fracture: Principal | ICD-10-CM

## 2022-12-21 DIAGNOSIS — I10 Essential (primary) hypertension: Secondary | ICD-10-CM | POA: Diagnosis present

## 2022-12-21 DIAGNOSIS — Z96653 Presence of artificial knee joint, bilateral: Secondary | ICD-10-CM | POA: Diagnosis present

## 2022-12-21 DIAGNOSIS — Z79899 Other long term (current) drug therapy: Secondary | ICD-10-CM

## 2022-12-21 DIAGNOSIS — Z79890 Hormone replacement therapy (postmenopausal): Secondary | ICD-10-CM

## 2022-12-21 DIAGNOSIS — Z825 Family history of asthma and other chronic lower respiratory diseases: Secondary | ICD-10-CM

## 2022-12-21 DIAGNOSIS — M8580 Other specified disorders of bone density and structure, unspecified site: Secondary | ICD-10-CM | POA: Diagnosis present

## 2022-12-21 DIAGNOSIS — I700 Atherosclerosis of aorta: Secondary | ICD-10-CM | POA: Diagnosis not present

## 2022-12-21 DIAGNOSIS — Z8249 Family history of ischemic heart disease and other diseases of the circulatory system: Secondary | ICD-10-CM | POA: Diagnosis not present

## 2022-12-21 DIAGNOSIS — M25552 Pain in left hip: Secondary | ICD-10-CM

## 2022-12-21 DIAGNOSIS — M84652A Pathological fracture in other disease, left femur, initial encounter for fracture: Secondary | ICD-10-CM | POA: Diagnosis not present

## 2022-12-21 DIAGNOSIS — Z83438 Family history of other disorder of lipoprotein metabolism and other lipidemia: Secondary | ICD-10-CM | POA: Diagnosis not present

## 2022-12-21 DIAGNOSIS — M87052 Idiopathic aseptic necrosis of left femur: Secondary | ICD-10-CM

## 2022-12-21 DIAGNOSIS — G4733 Obstructive sleep apnea (adult) (pediatric): Secondary | ICD-10-CM | POA: Diagnosis present

## 2022-12-21 DIAGNOSIS — Z01818 Encounter for other preprocedural examination: Secondary | ICD-10-CM | POA: Diagnosis not present

## 2022-12-21 LAB — URINALYSIS, ROUTINE W REFLEX MICROSCOPIC
Bilirubin Urine: NEGATIVE
Glucose, UA: NEGATIVE mg/dL
Hgb urine dipstick: NEGATIVE
Ketones, ur: NEGATIVE mg/dL
Leukocytes,Ua: NEGATIVE
Nitrite: NEGATIVE
Protein, ur: NEGATIVE mg/dL
Specific Gravity, Urine: 1.009 (ref 1.005–1.030)
pH: 6 (ref 5.0–8.0)

## 2022-12-21 LAB — CBC WITH DIFFERENTIAL/PLATELET
Abs Immature Granulocytes: 0.03 10*3/uL (ref 0.00–0.07)
Basophils Absolute: 0 10*3/uL (ref 0.0–0.1)
Basophils Relative: 0 %
Eosinophils Absolute: 0.1 10*3/uL (ref 0.0–0.5)
Eosinophils Relative: 2 %
HCT: 42.7 % (ref 36.0–46.0)
Hemoglobin: 13.3 g/dL (ref 12.0–15.0)
Immature Granulocytes: 0 %
Lymphocytes Relative: 10 %
Lymphs Abs: 0.9 10*3/uL (ref 0.7–4.0)
MCH: 27.4 pg (ref 26.0–34.0)
MCHC: 31.1 g/dL (ref 30.0–36.0)
MCV: 87.9 fL (ref 80.0–100.0)
Monocytes Absolute: 0.4 10*3/uL (ref 0.1–1.0)
Monocytes Relative: 5 %
Neutro Abs: 7.4 10*3/uL (ref 1.7–7.7)
Neutrophils Relative %: 83 %
Platelets: 252 10*3/uL (ref 150–400)
RBC: 4.86 MIL/uL (ref 3.87–5.11)
RDW: 15.9 % — ABNORMAL HIGH (ref 11.5–15.5)
WBC: 9 10*3/uL (ref 4.0–10.5)
nRBC: 0 % (ref 0.0–0.2)

## 2022-12-21 LAB — BASIC METABOLIC PANEL
Anion gap: 8 (ref 5–15)
BUN: 23 mg/dL (ref 8–23)
CO2: 23 mmol/L (ref 22–32)
Calcium: 9.3 mg/dL (ref 8.9–10.3)
Chloride: 100 mmol/L (ref 98–111)
Creatinine, Ser: 0.54 mg/dL (ref 0.44–1.00)
GFR, Estimated: >60 mL/min (ref >60)
Glucose, Bld: 112 mg/dL — ABNORMAL HIGH (ref 70–99)
Potassium: 4.1 mmol/L (ref 3.5–5.1)
Sodium: 131 mmol/L — ABNORMAL LOW (ref 135–145)

## 2022-12-21 LAB — PROTIME-INR
INR: 1 (ref 0.8–1.2)
Prothrombin Time: 13.4 seconds (ref 11.4–15.2)

## 2022-12-21 LAB — SEDIMENTATION RATE: Sed Rate: 21 mm/hr (ref 0–30)

## 2022-12-21 MED ORDER — HYDROCODONE-ACETAMINOPHEN 5-325 MG PO TABS
1.0000 | ORAL_TABLET | Freq: Once | ORAL | Status: AC
  Administered 2022-12-21: 1 via ORAL
  Filled 2022-12-21: qty 1

## 2022-12-21 MED ORDER — CYCLOBENZAPRINE HCL 10 MG PO TABS
5.0000 mg | ORAL_TABLET | Freq: Once | ORAL | Status: AC
  Administered 2022-12-21: 5 mg via ORAL
  Filled 2022-12-21: qty 1

## 2022-12-21 MED ORDER — SODIUM CHLORIDE 0.9 % IV BOLUS
1000.0000 mL | Freq: Once | INTRAVENOUS | Status: AC
  Administered 2022-12-21: 1000 mL via INTRAVENOUS

## 2022-12-21 NOTE — ED Triage Notes (Signed)
Pt arrives via [facility] from home c/o ongoing left hip pain. Pt follows up with ortho with scheduled hip replacement surgery in October. No injury/trauma today. Pt sts concern for difficulty ambulating s/t pain. Has taken prescribed pain medication without relief. No new numbness/tingling in leg.

## 2022-12-21 NOTE — ED Provider Notes (Signed)
[facility] Emergency Department Provider Note     Event Date/Time   First MD Initiated Contact with Patient 12/21/22 2056     (approximate)   History   Hip Pain (L)   HPI  Barbara Hernandez is a 82 y.o. female with a history of bilateral total knee replacements, HLD, HTN, and recent right hip arthroplasty (February 2024; Dr. Dion Saucier) presents to the ED for nontraumatic left hip pain and disability.  Patient presents to the ED via EMS secondary to inability to to bear weight on the left leg.  Patient who lives independently, uses a single-point cane to get around.  She would endorse of last 2 to 3 days she has had some increased hip pain disability.  That culminated today when she was unable to stand from a seated position and bear weight to the left leg, citing pain at the left groin.  Patient denies any injury or trauma including falls, near falls, or slipping.  She had expected to schedule a hip replacement surgery in October for the left hip which by her report was her more severely generated hip, however the right hip is more symptomatic at the time.  She denies any other injury at this time.  No bladder or bowel incontinence, foot drop, or saddle anesthesia.  Physical Exam   Triage Vital Signs: ED Triage Vitals  Enc Vitals Group     BP 12/21/22 1932 (!) 160/70     Pulse Rate 12/21/22 1932 76     Resp 12/21/22 1932 14     Temp 12/21/22 1932 97.9 F (36.6 C)     Temp Source 12/21/22 1932 Oral     SpO2 12/21/22 1932 99 %     Weight 12/21/22 1930 199 lb (90.3 kg)     Height 12/21/22 1930 5\' 2"  (1.575 m)     Head Circumference --      Peak Flow --      Pain Score 12/21/22 1930 9     Pain Loc --      Pain Edu? --      Excl. in [place]? --     Most recent vital signs: Vitals:   12/21/22 1932 12/21/22 2330  BP: (!) 160/70 (!) 150/80  Pulse: 76 88  Resp: 14 18  Temp: 97.9 F (36.6 C) 98.1 F (36.7 C)  SpO2: 99% 96%    General Awake, no distress.  NAD HEENT NCAT. PERRL. EOMI. No rhinorrhea. Mucous membranes are moist.  CV:  Good peripheral perfusion.  RESP:  Normal effort.  ABD:  No distention.  MSK:  Left hip without obvious deformity or dislocation.  Tender to palpation to the medial hip and groin region.  Decreased left hip flexion range. NEURO: CN II-XII grossly intact.    ED Results / Procedures / Treatments   Labs (all labs ordered are listed, but only abnormal results are displayed) Labs Reviewed  URINALYSIS, ROUTINE W REFLEX MICROSCOPIC - Abnormal; Notable for the following components:      Result Value   Color, Urine STRAW (*)    APPearance CLEAR (*)    All other components within normal limits  BASIC METABOLIC PANEL - Abnormal; Notable for the following components:   Sodium 131 (*)    Glucose, Bld 112 (*)    All other components within normal limits  CBC WITH DIFFERENTIAL/PLATELET - Abnormal; Notable for the following components:   RDW 15.9 (*)    All other components within normal limits  PROTIME-INR  SEDIMENTATION RATE  PROTIME-INR  C-REACTIVE PROTEIN   EKG  Vent. rate 57 BPM PR interval 194 ms QRS duration 86 ms QT/QTcB 444/432 ms P-R-T axes -3 30 53 Sinus bradycardia Otherwise normal ECG   RADIOLOGY  I personally viewed and evaluated these images as part of my medical decision making, as well as reviewing the written report by the radiologist.  ED Provider Interpretation: neck fracture noted  CT HIP LEFT WO CONTRAST  Result Date: 12/21/2022 CLINICAL DATA:  Left hip pain EXAM: CT OF THE LEFT HIP WITHOUT CONTRAST TECHNIQUE: Multidetector CT imaging of the left hip was performed according to the standard protocol. Multiplanar CT image reconstructions were also generated. RADIATION DOSE REDUCTION: This exam was performed according to the departmental dose-optimization program which includes automated exposure control, adjustment of the mA and/or kV according to patient size and/or use of iterative  reconstruction technique. COMPARISON:  Plain film from earlier in the same day. FINDINGS: Bones/Joint/Cartilage Severe degenerative changes of the left hip joint are noted with some subchondral sclerosis and cyst formation in the acetabulum. Some remodeling of the lateral margin of the acetabulum is noted superiorly. Remodeling of the femoral head is noted as well. Additionally there is a vertical fracture line identified through the deformed femoral head with some impaction at the fracture site. No other fracture is noted. The remainder of the visualized pelvis is within normal limits. Ligaments Suboptimally assessed by CT. Muscles and Tendons Surrounding musculature appears within normal limits. Soft tissues Pelvic soft tissue structures are unremarkable. No hematoma is seen. IMPRESSION: Vertical fracture line through a chronically deformed left femoral head. The overall appearance is similar to that seen on recent plain film. Significant degenerative changes involving the acetabulum on the left. Electronically Signed   By: Alcide Clever M.D.   On: 12/21/2022 23:31   DG Chest Portable 1 View  Result Date: 12/21/2022 CLINICAL DATA:  Preop hip fracture EXAM: PORTABLE CHEST 1 VIEW COMPARISON:  12/28/2021 FINDINGS: No acute airspace disease. Stable cardiomediastinal silhouette with aortic atherosclerosis. No pneumothorax IMPRESSION: No active disease. Electronically Signed   By: Jasmine Pang M.D.   On: 12/21/2022 22:52   DG Hip Unilat W or Wo Pelvis 2-3 Views Left  Result Date: 12/21/2022 CLINICAL DATA:  Left hip pain. EXAM: DG HIP (WITH OR WITHOUT PELVIS) 2-3V LEFT COMPARISON:  Radiograph dated 01/12/2022. FINDINGS: There is a minimally displaced fracture of the left femoral head/neck junction. There is mild superior migration of the femoral shaft and impaction on the acetabular roof. 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 No dislocation. Severe arthritic changes of the left hip with near complete loss of joint space. The bones are osteopenic. Total right hip arthroplasty. The soft tissues are unremarkable. IMPRESSION: 1. Minimally displaced fracture of the left femoral head/neck junction. 2. 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 Electronically Signed   By: Elgie Collard M.D.   On: 12/21/2022 21:47     PROCEDURES:  Critical Care performed: No  Procedures   MEDICATIONS ORDERED IN ED: Medications  HYDROcodone-acetaminophen (NORCO/VICODIN) 5-325 MG per tablet 1 tablet (1 tablet Oral Given 12/21/22 2156)  sodium chloride 0.9 % bolus 1,000 mL (1,000 mLs Intravenous New Bag/Given 12/21/22 2359)  cyclobenzaprine (FLEXERIL) tablet 5 mg (5 mg Oral Given 12/21/22 2357)     IMPRESSION / MDM / ASSESSMENT AND PLAN / ED COURSE  I reviewed the triage vital signs  and the nursing notes.                              Differential diagnosis includes, but is not limited to, fracture, hip dislocation, DJD, bursitis  ----------------------------------------- 10:32 PM on 12/21/2022 -----------------------------------------  Spoke with Dr. Signa Kell: he will evaluate the patient for expected surgical intervention no sooner than tomorrow.  ----------------------------------------- 12:01 AM on 12/22/2022 -----------------------------------------  Spoke with Dr. Ladona Ridgel: she will admit the patient to the hospital service as planned for orthopedic consultation and surgical intervention tomorrow.  Patient's presentation is most consistent with acute complicated illness / injury requiring diagnostic workup.  Patient's diagnosis is consistent with acute nontraumatic left femoral neck fracture secondary to severe underlying DJD.  Patient with confirmation of the fracture morphology based on plain films, interpreted by me.  Additional labs and CT imaging were requested by Dr. Allena Katz for surgical planning.  Patient is  agreeable to the plan of care at this time and agrees to admission and surgical intervention.  Patient will be admitted to the hospital service and expecting orthopedic intervention tomorrow.  FINAL CLINICAL IMPRESSION(S) / ED DIAGNOSES   Final diagnoses:  Closed fracture of left hip, initial encounter (HCC)  Acute pain of left hip     Rx / DC Orders   ED Discharge Orders     None        Note:  This document was prepared using Dragon voice recognition software and may include unintentional dictation errors.    Lissa Hoard, PA-C 12/22/22 Salley Hews    Corena Herter, MD 12/22/22 [phone]

## 2022-12-22 ENCOUNTER — Inpatient Hospital Stay: Payer: Medicare PPO | Admitting: Anesthesiology

## 2022-12-22 ENCOUNTER — Inpatient Hospital Stay: Payer: Medicare PPO

## 2022-12-22 ENCOUNTER — Other Ambulatory Visit: Payer: Self-pay

## 2022-12-22 ENCOUNTER — Encounter: Payer: Self-pay | Admitting: Internal Medicine

## 2022-12-22 ENCOUNTER — Encounter
Admission: EM | Disposition: A | Discharge status: Home Health | Payer: Self-pay | Source: Home / Self Care | Attending: Internal Medicine

## 2022-12-22 DIAGNOSIS — K219 Gastro-esophageal reflux disease without esophagitis: Secondary | ICD-10-CM | POA: Diagnosis present

## 2022-12-22 DIAGNOSIS — Z833 Family history of diabetes mellitus: Secondary | ICD-10-CM | POA: Diagnosis not present

## 2022-12-22 DIAGNOSIS — Z888 Allergy status to other drugs, medicaments and biological substances status: Secondary | ICD-10-CM | POA: Diagnosis not present

## 2022-12-22 DIAGNOSIS — Z83438 Family history of other disorder of lipoprotein metabolism and other lipidemia: Secondary | ICD-10-CM | POA: Diagnosis not present

## 2022-12-22 DIAGNOSIS — Z96641 Presence of right artificial hip joint: Secondary | ICD-10-CM | POA: Diagnosis present

## 2022-12-22 DIAGNOSIS — Z803 Family history of malignant neoplasm of breast: Secondary | ICD-10-CM | POA: Diagnosis not present

## 2022-12-22 DIAGNOSIS — Z8249 Family history of ischemic heart disease and other diseases of the circulatory system: Secondary | ICD-10-CM | POA: Diagnosis not present

## 2022-12-22 DIAGNOSIS — E669 Obesity, unspecified: Secondary | ICD-10-CM | POA: Diagnosis present

## 2022-12-22 DIAGNOSIS — Z79890 Hormone replacement therapy (postmenopausal): Secondary | ICD-10-CM | POA: Diagnosis not present

## 2022-12-22 DIAGNOSIS — I10 Essential (primary) hypertension: Secondary | ICD-10-CM | POA: Diagnosis present

## 2022-12-22 DIAGNOSIS — E785 Hyperlipidemia, unspecified: Secondary | ICD-10-CM | POA: Diagnosis present

## 2022-12-22 DIAGNOSIS — S72002A Fracture of unspecified part of neck of left femur, initial encounter for closed fracture: Secondary | ICD-10-CM

## 2022-12-22 DIAGNOSIS — S72009A Fracture of unspecified part of neck of unspecified femur, initial encounter for closed fracture: Secondary | ICD-10-CM | POA: Diagnosis present

## 2022-12-22 DIAGNOSIS — M879 Osteonecrosis, unspecified: Secondary | ICD-10-CM | POA: Diagnosis present

## 2022-12-22 DIAGNOSIS — E039 Hypothyroidism, unspecified: Secondary | ICD-10-CM | POA: Diagnosis present

## 2022-12-22 DIAGNOSIS — S72052A Unspecified fracture of head of left femur, initial encounter for closed fracture: Secondary | ICD-10-CM

## 2022-12-22 DIAGNOSIS — I2510 Atherosclerotic heart disease of native coronary artery without angina pectoris: Secondary | ICD-10-CM | POA: Diagnosis present

## 2022-12-22 DIAGNOSIS — Z79899 Other long term (current) drug therapy: Secondary | ICD-10-CM | POA: Diagnosis not present

## 2022-12-22 DIAGNOSIS — Z96653 Presence of artificial knee joint, bilateral: Secondary | ICD-10-CM | POA: Diagnosis present

## 2022-12-22 DIAGNOSIS — Z87891 Personal history of nicotine dependence: Secondary | ICD-10-CM | POA: Diagnosis not present

## 2022-12-22 DIAGNOSIS — Z825 Family history of asthma and other chronic lower respiratory diseases: Secondary | ICD-10-CM | POA: Diagnosis not present

## 2022-12-22 DIAGNOSIS — G4733 Obstructive sleep apnea (adult) (pediatric): Secondary | ICD-10-CM | POA: Diagnosis present

## 2022-12-22 DIAGNOSIS — J449 Chronic obstructive pulmonary disease, unspecified: Secondary | ICD-10-CM | POA: Diagnosis present

## 2022-12-22 DIAGNOSIS — M87052 Idiopathic aseptic necrosis of left femur: Secondary | ICD-10-CM

## 2022-12-22 DIAGNOSIS — Z6836 Body mass index (BMI) 36.0-36.9, adult: Secondary | ICD-10-CM | POA: Diagnosis not present

## 2022-12-22 DIAGNOSIS — Z808 Family history of malignant neoplasm of other organs or systems: Secondary | ICD-10-CM | POA: Diagnosis not present

## 2022-12-22 DIAGNOSIS — Z818 Family history of other mental and behavioral disorders: Secondary | ICD-10-CM | POA: Diagnosis not present

## 2022-12-22 HISTORY — PX: TOTAL HIP ARTHROPLASTY: SHX124

## 2022-12-22 LAB — CBC
HCT: 40.3 % (ref 36.0–46.0)
Hemoglobin: 12.7 g/dL (ref 12.0–15.0)
MCH: 27.7 pg (ref 26.0–34.0)
MCHC: 31.5 g/dL (ref 30.0–36.0)
MCV: 88 fL (ref 80.0–100.0)
Platelets: 246 10*3/uL (ref 150–400)
RBC: 4.58 MIL/uL (ref 3.87–5.11)
RDW: 15.8 % — ABNORMAL HIGH (ref 11.5–15.5)
WBC: 6.3 10*3/uL (ref 4.0–10.5)
nRBC: 0 % (ref 0.0–0.2)

## 2022-12-22 LAB — C-REACTIVE PROTEIN: CRP: 0.6 mg/dL (ref <1.0)

## 2022-12-22 LAB — BASIC METABOLIC PANEL
Anion gap: 9 (ref 5–15)
BUN: 16 mg/dL (ref 8–23)
CO2: 23 mmol/L (ref 22–32)
Calcium: 8.9 mg/dL (ref 8.9–10.3)
Chloride: 104 mmol/L (ref 98–111)
Creatinine, Ser: 0.51 mg/dL (ref 0.44–1.00)
GFR, Estimated: >60 mL/min (ref >60)
Glucose, Bld: 111 mg/dL — ABNORMAL HIGH (ref 70–99)
Potassium: 3.7 mmol/L (ref 3.5–5.1)
Sodium: 136 mmol/L (ref 135–145)

## 2022-12-22 LAB — VITAMIN D 25 HYDROXY (VIT D DEFICIENCY, FRACTURES): Vit D, 25-Hydroxy: 52.64 ng/mL (ref 30–100)

## 2022-12-22 LAB — SURGICAL PCR SCREEN
MRSA, PCR: NEGATIVE
Staphylococcus aureus: NEGATIVE

## 2022-12-22 SURGERY — ARTHROPLASTY, HIP, TOTAL, ANTERIOR APPROACH
Anesthesia: Spinal | Site: Hip | Laterality: Left

## 2022-12-22 MED ORDER — TRANEXAMIC ACID-NACL 1000-0.7 MG/100ML-% IV SOLN
INTRAVENOUS | Status: AC
  Filled 2022-12-22: qty 100

## 2022-12-22 MED ORDER — DEXAMETHASONE SODIUM PHOSPHATE 10 MG/ML IJ SOLN
INTRAMUSCULAR | Status: DC | PRN
  Administered 2022-12-22: 10 mg via INTRAVENOUS

## 2022-12-22 MED ORDER — METOCLOPRAMIDE HCL 5 MG/ML IJ SOLN: 5.0000 mg | Freq: Three times a day (TID) | INTRAMUSCULAR | Status: DC | PRN

## 2022-12-22 MED ORDER — ACETAMINOPHEN 500 MG PO TABS
1000.0000 mg | ORAL_TABLET | Freq: Three times a day (TID) | ORAL | Status: DC
  Administered 2022-12-23: 1000 mg via ORAL
  Filled 2022-12-22: qty 2

## 2022-12-22 MED ORDER — BUPIVACAINE HCL (PF) 0.5 % IJ SOLN
INTRAMUSCULAR | Status: DC | PRN
  Administered 2022-12-22: 2.4 mL

## 2022-12-22 MED ORDER — 0.9 % SODIUM CHLORIDE (POUR BTL) OPTIME
TOPICAL | Status: DC | PRN
  Administered 2022-12-22: 500 mL

## 2022-12-22 MED ORDER — CYCLOBENZAPRINE HCL 10 MG PO TABS: 5.0000 mg | ORAL_TABLET | Freq: Three times a day (TID) | ORAL | Status: DC | PRN

## 2022-12-22 MED ORDER — SODIUM CHLORIDE FLUSH 0.9 % IV SOLN
INTRAVENOUS | Status: AC
  Filled 2022-12-22: qty 10

## 2022-12-22 MED ORDER — FENTANYL CITRATE (PF) 100 MCG/2ML IJ SOLN
INTRAMUSCULAR | Status: DC | PRN
  Administered 2022-12-22: 50 ug via INTRAVENOUS

## 2022-12-22 MED ORDER — BUPIVACAINE LIPOSOME 1.3 % IJ SUSP
INTRAMUSCULAR | Status: AC
  Filled 2022-12-22: qty 20

## 2022-12-22 MED ORDER — ACETAMINOPHEN 10 MG/ML IV SOLN
INTRAVENOUS | Status: DC | PRN
  Administered 2022-12-22: 1000 mg via INTRAVENOUS

## 2022-12-22 MED ORDER — SODIUM CHLORIDE 0.9 % IV SOLN: INTRAVENOUS | Status: AC

## 2022-12-22 MED ORDER — ONDANSETRON HCL 4 MG/2ML IJ SOLN: 4.0000 mg | Freq: Once | INTRAMUSCULAR | Status: DC | PRN

## 2022-12-22 MED ORDER — HYDROCODONE-ACETAMINOPHEN 5-325 MG PO TABS: 1.0000 | ORAL_TABLET | ORAL | Status: DC | PRN

## 2022-12-22 MED ORDER — TRAMADOL HCL 50 MG PO TABS: 50.0000 mg | ORAL_TABLET | Freq: Four times a day (QID) | ORAL | Status: DC | PRN

## 2022-12-22 MED ORDER — CEFAZOLIN SODIUM-DEXTROSE 2-3 GM-%(50ML) IV SOLR
INTRAVENOUS | Status: DC | PRN
  Administered 2022-12-22: 2 g via INTRAVENOUS

## 2022-12-22 MED ORDER — LISINOPRIL 20 MG PO TABS
40.0000 mg | ORAL_TABLET | Freq: Every day | ORAL | Status: DC
  Administered 2022-12-23: 40 mg via ORAL
  Filled 2022-12-22: qty 2

## 2022-12-22 MED ORDER — LEVOTHYROXINE SODIUM 50 MCG PO TABS
100.0000 ug | ORAL_TABLET | Freq: Every day | ORAL | Status: DC
  Administered 2022-12-22 – 2022-12-23 (×2): 100 ug via ORAL
  Filled 2022-12-22 (×2): qty 2

## 2022-12-22 MED ORDER — ACETAMINOPHEN 10 MG/ML IV SOLN: 1000.0000 mg | Freq: Once | INTRAVENOUS | Status: DC | PRN

## 2022-12-22 MED ORDER — TRANEXAMIC ACID-NACL 1000-0.7 MG/100ML-% IV SOLN
INTRAVENOUS | Status: DC | PRN
  Administered 2022-12-22 (×2): 1000 mg via INTRAVENOUS

## 2022-12-22 MED ORDER — IPRATROPIUM-ALBUTEROL 0.5-2.5 (3) MG/3ML IN SOLN
3.0000 mL | Freq: Two times a day (BID) | RESPIRATORY_TRACT | Status: DC
  Administered 2022-12-22 – 2022-12-23 (×3): 3 mL via RESPIRATORY_TRACT
  Filled 2022-12-22 (×4): qty 3

## 2022-12-22 MED ORDER — IPRATROPIUM-ALBUTEROL 0.5-2.5 (3) MG/3ML IN SOLN: 3.0000 mL | RESPIRATORY_TRACT | Status: DC | PRN

## 2022-12-22 MED ORDER — PHENOL 1.4 % MT LIQD: 1.0000 | OROMUCOSAL | Status: DC | PRN

## 2022-12-22 MED ORDER — DEXAMETHASONE SODIUM PHOSPHATE 10 MG/ML IJ SOLN
INTRAMUSCULAR | Status: AC
  Filled 2022-12-22: qty 1

## 2022-12-22 MED ORDER — MORPHINE SULFATE (PF) 2 MG/ML IV SOLN: 0.5000 mg | INTRAVENOUS | Status: DC | PRN

## 2022-12-22 MED ORDER — OXYCODONE HCL 5 MG PO TABS: 5.0000 mg | ORAL_TABLET | Freq: Once | ORAL | Status: DC | PRN

## 2022-12-22 MED ORDER — GLYCOPYRROLATE 0.2 MG/ML IJ SOLN
INTRAMUSCULAR | Status: AC
  Filled 2022-12-22: qty 1

## 2022-12-22 MED ORDER — FENTANYL CITRATE (PF) 100 MCG/2ML IJ SOLN: 25.0000 ug | INTRAMUSCULAR | Status: DC | PRN

## 2022-12-22 MED ORDER — BUPIVACAINE-EPINEPHRINE (PF) 0.25% -1:200000 IJ SOLN
INTRAMUSCULAR | Status: AC
  Filled 2022-12-22: qty 30

## 2022-12-22 MED ORDER — PROPOFOL 10 MG/ML IV BOLUS
INTRAVENOUS | Status: DC | PRN
  Administered 2022-12-22: 50 ug/kg/min via INTRAVENOUS

## 2022-12-22 MED ORDER — CEFAZOLIN SODIUM-DEXTROSE 2-4 GM/100ML-% IV SOLN
2.0000 g | Freq: Three times a day (TID) | INTRAVENOUS | Status: AC
  Administered 2022-12-22 – 2022-12-23 (×2): 2 g via INTRAVENOUS
  Filled 2022-12-22 (×2): qty 100

## 2022-12-22 MED ORDER — SIMVASTATIN 20 MG PO TABS
20.0000 mg | ORAL_TABLET | Freq: Every day | ORAL | Status: DC
  Administered 2022-12-23: 20 mg via ORAL
  Filled 2022-12-22: qty 1

## 2022-12-22 MED ORDER — PANTOPRAZOLE SODIUM 40 MG PO TBEC
40.0000 mg | DELAYED_RELEASE_TABLET | Freq: Every day | ORAL | Status: DC
  Administered 2022-12-22 – 2022-12-23 (×2): 40 mg via ORAL
  Filled 2022-12-22 (×2): qty 1

## 2022-12-22 MED ORDER — CARVEDILOL 3.125 MG PO TABS
3.1250 mg | ORAL_TABLET | Freq: Two times a day (BID) | ORAL | Status: DC
  Administered 2022-12-22 – 2022-12-23 (×3): 3.125 mg via ORAL
  Filled 2022-12-22 (×3): qty 1

## 2022-12-22 MED ORDER — LACTATED RINGERS IV SOLN: INTRAVENOUS | Status: DC

## 2022-12-22 MED ORDER — MENTHOL 3 MG MT LOZG: 1.0000 | LOZENGE | OROMUCOSAL | Status: DC | PRN

## 2022-12-22 MED ORDER — ONDANSETRON HCL 4 MG/2ML IJ SOLN: 4.0000 mg | Freq: Four times a day (QID) | INTRAMUSCULAR | Status: DC | PRN

## 2022-12-22 MED ORDER — ACETAMINOPHEN 10 MG/ML IV SOLN
INTRAVENOUS | Status: AC
  Filled 2022-12-22: qty 100

## 2022-12-22 MED ORDER — AMLODIPINE BESYLATE 5 MG PO TABS
7.5000 mg | ORAL_TABLET | Freq: Every day | ORAL | Status: DC
  Administered 2022-12-23: 7.5 mg via ORAL
  Filled 2022-12-22: qty 2

## 2022-12-22 MED ORDER — SURGIRINSE WOUND IRRIGATION SYSTEM - OPTIME: TOPICAL | Status: DC | PRN

## 2022-12-22 MED ORDER — KETOROLAC TROMETHAMINE 15 MG/ML IJ SOLN
7.5000 mg | Freq: Four times a day (QID) | INTRAMUSCULAR | Status: AC
  Administered 2022-12-22 – 2022-12-23 (×4): 7.5 mg via INTRAVENOUS
  Filled 2022-12-22 (×5): qty 1

## 2022-12-22 MED ORDER — LACTATED RINGERS IV SOLN: INTRAVENOUS | Status: DC | PRN

## 2022-12-22 MED ORDER — PHENYLEPHRINE HCL (PRESSORS) 10 MG/ML IV SOLN
INTRAVENOUS | Status: DC | PRN
  Administered 2022-12-22: 80 ug via INTRAVENOUS

## 2022-12-22 MED ORDER — OYSTER SHELL CALCIUM/D3 500-5 MG-MCG PO TABS
2.0000 | ORAL_TABLET | Freq: Every day | ORAL | Status: DC
  Administered 2022-12-23: 2 via ORAL
  Filled 2022-12-22: qty 2

## 2022-12-22 MED ORDER — PROPOFOL 10 MG/ML IV BOLUS
INTRAVENOUS | Status: AC
  Filled 2022-12-22: qty 20

## 2022-12-22 MED ORDER — FENTANYL CITRATE (PF) 100 MCG/2ML IJ SOLN
INTRAMUSCULAR | Status: AC
  Filled 2022-12-22: qty 2

## 2022-12-22 MED ORDER — CEFAZOLIN SODIUM-DEXTROSE 2-4 GM/100ML-% IV SOLN
INTRAVENOUS | Status: AC
  Filled 2022-12-22: qty 100

## 2022-12-22 MED ORDER — METOCLOPRAMIDE HCL 5 MG PO TABS: 5.0000 mg | ORAL_TABLET | Freq: Three times a day (TID) | ORAL | Status: DC | PRN

## 2022-12-22 MED ORDER — OXYCODONE HCL 5 MG/5ML PO SOLN: 5.0000 mg | Freq: Once | ORAL | Status: DC | PRN

## 2022-12-22 MED ORDER — ONDANSETRON HCL 4 MG PO TABS: 4.0000 mg | ORAL_TABLET | Freq: Four times a day (QID) | ORAL | Status: DC | PRN

## 2022-12-22 MED ORDER — DOCUSATE SODIUM 100 MG PO CAPS
100.0000 mg | ORAL_CAPSULE | Freq: Two times a day (BID) | ORAL | Status: DC
  Administered 2022-12-22 – 2022-12-23 (×2): 100 mg via ORAL
  Filled 2022-12-22 (×2): qty 1

## 2022-12-22 MED ORDER — KETOROLAC TROMETHAMINE 15 MG/ML IJ SOLN
INTRAMUSCULAR | Status: AC
  Filled 2022-12-22: qty 1

## 2022-12-22 MED ORDER — SODIUM CHLORIDE 0.9 % IR SOLN
Status: DC | PRN
  Administered 2022-12-22: 1

## 2022-12-22 MED ORDER — PHENYLEPHRINE HCL-NACL 20-0.9 MG/250ML-% IV SOLN
INTRAVENOUS | Status: DC | PRN
  Administered 2022-12-22: 40 ug/min via INTRAVENOUS

## 2022-12-22 MED ORDER — SODIUM CHLORIDE 0.9 % IV SOLN: INTRAVENOUS | Status: DC

## 2022-12-22 MED ORDER — PHENYLEPHRINE 80 MCG/ML (10ML) SYRINGE FOR IV PUSH (FOR BLOOD PRESSURE SUPPORT)
PREFILLED_SYRINGE | INTRAVENOUS | Status: AC
  Filled 2022-12-22: qty 10

## 2022-12-22 MED ORDER — ONDANSETRON HCL 4 MG/2ML IJ SOLN
INTRAMUSCULAR | Status: DC | PRN
  Administered 2022-12-22: 4 mg via INTRAVENOUS

## 2022-12-22 MED ORDER — MORPHINE SULFATE (PF) 2 MG/ML IV SOLN
0.5000 mg | INTRAVENOUS | Status: DC | PRN
  Administered 2022-12-22: 0.5 mg via INTRAVENOUS
  Filled 2022-12-22: qty 1

## 2022-12-22 MED ORDER — ENOXAPARIN SODIUM 40 MG/0.4ML IJ SOSY
40.0000 mg | PREFILLED_SYRINGE | INTRAMUSCULAR | Status: DC
  Administered 2022-12-23: 40 mg via SUBCUTANEOUS
  Filled 2022-12-22: qty 0.4

## 2022-12-22 MED ORDER — HYDROCODONE-ACETAMINOPHEN 5-325 MG PO TABS
1.0000 | ORAL_TABLET | ORAL | Status: DC | PRN
  Administered 2022-12-22 – 2022-12-23 (×3): 1 via ORAL
  Filled 2022-12-22 (×3): qty 1

## 2022-12-22 MED ORDER — VITAMIN D 25 MCG (1000 UNIT) PO TABS
2000.0000 [IU] | ORAL_TABLET | Freq: Every evening | ORAL | Status: DC
  Administered 2022-12-22: 2000 [IU] via ORAL
  Filled 2022-12-22: qty 2

## 2022-12-22 MED ORDER — GLYCOPYRROLATE PF 0.2 MG/ML IJ SOSY
PREFILLED_SYRINGE | INTRAMUSCULAR | Status: DC | PRN
  Administered 2022-12-22: .2 mg via INTRAVENOUS

## 2022-12-22 MED ORDER — ADULT MULTIVITAMIN W/MINERALS CH
1.0000 | ORAL_TABLET | Freq: Every day | ORAL | Status: DC
  Administered 2022-12-23: 1 via ORAL
  Filled 2022-12-22: qty 1

## 2022-12-22 MED ORDER — SODIUM CHLORIDE (PF) 0.9 % IJ SOLN
INTRAMUSCULAR | Status: DC | PRN
  Administered 2022-12-22: 50 mL via INTRAMUSCULAR

## 2022-12-22 MED ORDER — EPHEDRINE SULFATE (PRESSORS) 50 MG/ML IJ SOLN
INTRAMUSCULAR | Status: DC | PRN
  Administered 2022-12-22: 5 mg via INTRAVENOUS

## 2022-12-22 SURGICAL SUPPLY — 70 items
ADH SKN CLS APL DERMABOND .7 (GAUZE/BANDAGES/DRESSINGS) ×1
AGENT HMST KT MTR STRL THRMB (HEMOSTASIS)
APL PRP STRL LF DISP 70% ISPRP (MISCELLANEOUS) ×1
BLADE SAGITTAL AGGR TOOTH XLG (BLADE) ×1 IMPLANT
BNDG CMPR 5X6 CHSV STRCH STRL (GAUZE/BANDAGES/DRESSINGS) ×1
BNDG COHESIVE 6X5 TAN ST LF (GAUZE/BANDAGES/DRESSINGS) ×1 IMPLANT
CHLORAPREP W/TINT 26 (MISCELLANEOUS) ×1 IMPLANT
COVER BACK TABLE REUSABLE LG (DRAPES) ×1 IMPLANT
DERMABOND ADVANCED .7 DNX12 (GAUZE/BANDAGES/DRESSINGS) ×1 IMPLANT
DRAPE 3/4 80X56 (DRAPES) ×1 IMPLANT
DRAPE C-ARM XRAY 36X54 (DRAPES) ×1 IMPLANT
DRAPE POUCH INSTRU U-SHP 10X18 (DRAPES) ×1 IMPLANT
DRSG MEPILEX SACRM 8.7X9.8 (GAUZE/BANDAGES/DRESSINGS) ×1 IMPLANT
DRSG OPSITE POSTOP 4X8 (GAUZE/BANDAGES/DRESSINGS) ×1 IMPLANT
ELECT BLADE 4.0 EZ CLEAN MEGAD (MISCELLANEOUS) ×1
ELECT REM PT RETURN 9FT ADLT (ELECTROSURGICAL) ×1
ELECTRODE BLDE 4.0 EZ CLN MEGD (MISCELLANEOUS) ×1 IMPLANT
ELECTRODE REM PT RTRN 9FT ADLT (ELECTROSURGICAL) ×1 IMPLANT
GLOVE BIO SURGEON STRL SZ8 (GLOVE) ×1 IMPLANT
GLOVE BIOGEL PI IND STRL 8 (GLOVE) ×1 IMPLANT
GLOVE PI ORTHO PRO STRL 7.5 (GLOVE) ×2 IMPLANT
GLOVE PI ORTHO PRO STRL SZ8 (GLOVE) ×2 IMPLANT
GLOVE SURG SYN 7.5 E (GLOVE) ×1 IMPLANT
GLOVE SURG SYN 7.5 PF PI (GLOVE) ×1 IMPLANT
GOWN SRG XL LVL 3 NONREINFORCE (GOWNS) ×1 IMPLANT
GOWN STRL NON-REIN TWL XL LVL3 (GOWNS) ×1
GOWN STRL REUS W/ TWL LRG LVL3 (GOWN DISPOSABLE) ×1 IMPLANT
GOWN STRL REUS W/ TWL XL LVL3 (GOWN DISPOSABLE) ×1 IMPLANT
GOWN STRL REUS W/TWL LRG LVL3 (GOWN DISPOSABLE) ×1
GOWN STRL REUS W/TWL XL LVL3 (GOWN DISPOSABLE) ×1
HANDLE YANKAUER SUCT OPEN TIP (MISCELLANEOUS) ×1 IMPLANT
HEAD CERAMIC FEMORAL 36MM (Head) IMPLANT
HOOD PEEL AWAY T7 (MISCELLANEOUS) ×2 IMPLANT
INSERT 0 DEGREE 36 (Miscellaneous) IMPLANT
IV NS 100ML SINGLE PACK (IV SOLUTION) ×1 IMPLANT
KIT PATIENT CARE HANA TABLE (KITS) ×1 IMPLANT
LIGHT WAVEGUIDE WIDE FLAT (MISCELLANEOUS) ×1 IMPLANT
MANIFOLD NEPTUNE II (INSTRUMENTS) ×1 IMPLANT
MARKER SKIN DUAL TIP RULER LAB (MISCELLANEOUS) ×1 IMPLANT
MAT ABSORB FLUID 56X50 GRAY (MISCELLANEOUS) ×1 IMPLANT
NDL FILTER BLUNT 18X1 1/2 (NEEDLE) ×1 IMPLANT
NDL SAFETY ECLIP 18X1.5 (MISCELLANEOUS) ×1 IMPLANT
NDL SPNL 20GX3.5 QUINCKE YW (NEEDLE) ×1 IMPLANT
NEEDLE FILTER BLUNT 18X1 1/2 (NEEDLE) ×1 IMPLANT
NEEDLE SPNL 20GX3.5 QUINCKE YW (NEEDLE) ×1 IMPLANT
NS IRRIG 500ML POUR BTL (IV SOLUTION) ×1 IMPLANT
PACK HIP COMPR (MISCELLANEOUS) ×1 IMPLANT
PAD ARMBOARD 7.5X6 YLW CONV (MISCELLANEOUS) ×1 IMPLANT
SCREW HEX LP 6.5X20 (Screw) IMPLANT
SCREW HEX LP 6.5X30 (Screw) IMPLANT
SHELL ACETAB TRIDENT 48 (Shell) IMPLANT
SLEEVE SCD COMPRESS KNEE MED (STOCKING) ×1 IMPLANT
SOLUTION IRRIG SURGIPHOR (IV SOLUTION) ×1 IMPLANT
STEM HIGH OFFSET SZ4 36X103 (Stem) IMPLANT
SURGIFLO W/THROMBIN 8M KIT (HEMOSTASIS) IMPLANT
SUT BONE WAX W31G (SUTURE) ×1 IMPLANT
SUT DVC 2 QUILL PDO T11 36X36 (SUTURE) ×1 IMPLANT
SUT ETHIBOND 2 V 37 (SUTURE) ×1 IMPLANT
SUT QUILL MONODERM 3-0 PS-2 (SUTURE) ×1 IMPLANT
SUT SILK 0 (SUTURE) ×1
SUT SILK 0 30XBRD TIE 6 (SUTURE) ×1 IMPLANT
SUT VIC AB 0 CT1 36 (SUTURE) ×1 IMPLANT
SUT VIC AB 2-0 CT2 27 (SUTURE) ×2 IMPLANT
SYR 30ML LL (SYRINGE) ×2 IMPLANT
SYR TB 1ML LL NO SAFETY (SYRINGE) ×1 IMPLANT
TAPE MICROFOAM 4IN (TAPE) IMPLANT
TOWEL OR 17X26 4PK STRL BLUE (TOWEL DISPOSABLE) IMPLANT
TRAP FLUID SMOKE EVACUATOR (MISCELLANEOUS) ×1 IMPLANT
WAND WEREWOLF FASTSEAL 6.0 (MISCELLANEOUS) ×1 IMPLANT
WATER STERILE IRR 1000ML POUR (IV SOLUTION) ×1 IMPLANT

## 2022-12-22 NOTE — Progress Notes (Signed)
PT Cancellation Note  Patient Details Name: Barbara Hernandez MRN: [id number] DOB: [birth year]   Cancelled Treatment:    Reason Eval/Treat Not Completed: Patient not medically ready PT orders received, chart reviewed. Imaging showed "Minimally displaced fracture of the left femoral head/neck junction." Ortho has been consulted, awaiting their recommendations. Will hold PT evaluation at this time.  Aleda Grana, PT, DPT 12/22/22, 7:53 AM   Sandi Mariscal 12/22/2022, 7:52 AM

## 2022-12-22 NOTE — Interval H&P Note (Signed)
Patient history and physical updated. Consent reviewed including risks, benefits, and alternatives to surgery. 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

## 2022-12-22 NOTE — Anesthesia Procedure Notes (Signed)
Spinal  Patient location during procedure: OR Start time: 12/22/2022 3:15 PM End time: 12/22/2022 3:23 PM Reason for block: surgical anesthesia Staffing Performed: resident/CRNA  Anesthesiologist: Corinda Gubler, MD Resident/CRNA: Elmarie Mainland, CRNA Performed by: Elmarie Mainland, CRNA Authorized by: Corinda Gubler, MD   Preanesthetic Checklist Completed: patient identified, IV checked, site marked, risks and benefits discussed, surgical consent, monitors and equipment checked, pre-op evaluation and timeout performed Spinal Block Patient position: sitting Prep: ChloraPrep Patient monitoring: heart rate, continuous pulse ox, blood pressure and cardiac monitor Approach: midline Location: L3-4 Injection technique: single-shot Needle Needle type: Introducer and Pencan  Needle gauge: 24 G Needle length: 9 cm Assessment Sensory level: T10 Events: CSF return Additional Notes Sterile aseptic technique used throughout the procedure.  Negative paresthesia. Negative blood return. Positive free-flowing CSF. Expiration date of kit checked and confirmed. Patient tolerated procedure well, without complications.

## 2022-12-22 NOTE — Op Note (Signed)
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  Post-Operative Diagnosis: (same)  Procedure: Left Total Hip Arthroplasty  Components/Implants: Cup: Trident Tritanium Clusterhole 48/D with x2 Screws    Liner: Neutral X3 Poly 36/D  Stem: Insignia #4 High offset  Head:Biolox Ceramic 36mm +87mm   Date of Surgery: 12/22/2022  Surgeon: Reinaldo Berber MD  Assistant: [facility] RNFA (present and scrubbed throughout the case, critical for assistance with exposure, retraction, instrumentation, and closure)   Anesthesiologist: Suzan Slick  Anesthesia: Spinal   EBL: 150cc  IVF:700cc  Complications: None   Brief history: The patient is a 82 year old female with history of left hip pain and chronic degeneration of the hip over the past year who was previously scheduled for a left total hip replacement.  The patient came in with a severe increase in pain and was found to have a femoral head fracture on the left side through what appears to be avascular chronic changes to the head on CT scan and x-ray imaging.  Treatment options were reviewed with the patient and her daughter and the risks and benefits of surgical intervention were discussed.  Patient and daughter elected to move forward with left hip total hip arthroplasty as definitive surgical treatment for her hip.  All preoperative films were reviewed and an appropriate surgical plan was made prior to surgery.   Description of procedure: The patient was brought to the operating room where laterality was confirmed by all those present to be the left side.  The patient was administered spinal anesthesia on a stretcher prior to being moved supine on the operating room table. Patient was given an intravenous dose of antibiotics for surgical prophylaxis and TXA .  All bony prominences and extremities were well padded and the patient was securely attached to the table boots, a  perineal post was placed and the patient had a safety strap placed.  Surgical site was prepped with alcohol and chlorhexidine. The surgical site over the hip was and draped in typical sterile fashion with multiple layers of adhesive and nonadhesive drapes.  The incision site was marked out with a sterile marker and care was taken to assess the position of the ASIS and ensure appropriate position for the incision.    A surgical timeout was then called with participation of all staff in the room the patient was then a confirmed again and laterality confirmed.  Incision was made over the anterior lateral aspect of the proximal thigh in line with the TFL.  Appropriate retractors were placed and all bleeding vessels were coagulated within the subcutaneous and fatty layers.  An incision was made in the TFL fascia in the interval was carefully identified.  The lateral ascending branches of the circumflex vessels were identified, cauterized and carefully dissected. The main vessels were then tied with a 0 silk hand tie.  Retractors were placed around the superior lateral and inferior medial aspects of the femoral neck and a capsulotomy was performed exposing the hip joint.  Retraction stitches were placed and the capsulotomy to assist with visualization.  At this time the femoral neck and head were evaluated and there was found to be large osteophytes around the femoral head with chronic morphologic changes to the superior aspect of the femoral neck and the femoral head was split in half.  Retractors were placed about the inferior and superior aspect of the femoral neck.  Femoral neck cut was then made and the femoral neck and  inferior third of the femoral head was extracted after placing the leg in traction.  The remainder of the femoral head was removed from the acetabulum and was found to be severely demineralized with the cartilage falling off of the bone consistent with an avascular necrosis picture.  Acetabulum was  fully irrigated and all bony fragments were removed.  Bone wax was then applied to the proximal cut surface of the femur and aqua mantis was used to address any bleeding around the femoral neck cut.  Retractors were then placed around the acetabulum to fully visualize the joint space, and the remaining labral tissue was removed and pulvinar was removed.   The acetabulum was then sequentially reamed up to the appropriate size in order to get good fit and fill for the acetabular component while under fluoroscopic guidance.  Acetabular component was then placed and malleted into a secure fit while confirming position and abduction angle and anteversion utilizing fluoroscopy.  2 screws were then placed in the acetabular cup to assist in securing the cup in place.  The cup was then irrigated and a real neutral liner was placed, impacted, and checked for stability.  The femur traction was dropped and sequentially externally rotated while performing a release of the posterior and superomedial tissues off of the proximal femur to allow for mobility, care was taken to preserve the external rotators and piriformis attachments.  The remaining interval between the abductors and the capsule was dissected out and a retractor was placed over the superolateral aspect of the femur over the greater trochanter.  The leg was carefully brought down into extension and adducted to provide visualization of the proximal femur for broaching.  The femoral neck was then carefully evaluated and there was no evidence of any fracture lines.  The femur was then sequentially broached up to an appropriate size which provided for good fill and stability to the femoral broach.  A trial neck and head were placed on the femoral broach and the leg was brought up for reduction.  The hip was reduced and manual check of stability was performed.  The hip was found to be stable in flexion internal rotation and extension external rotation.  Leg lengths were  confirmed on fluoroscopy.   The hip was then dislocated the trial neck and head were removed. The leg was then brought down into extension and adduction in the proximal femur was reexposed.  The broach trial was removed and the femur was irrigated with normal saline prior to the real femoral stem being implanted.  After the femoral stem was seated and shown to have good fit and fill the appropriate head was impacted the leg was brought up and reduced.  There was good range of motion with stability in flexion internal rotation and extension external rotation on testing.  Leg lengths were found to be appropriate on fluoroscopic evaluation at this time.  The hip was then irrigated with betdine based surgiphor solution and then saline solution.  The capsulotomy was repaired with Ethibond sutures.  A pericapsular and peritrochanteric cocktail with Exparel and bupivacaine was then injected as well as the subcutaneous tissues. The fascia was closed with a #2 barbed running suture.  The deep tissues were closed with Vicryl sutures the subcutaneous tissues were closed with interrupted Vicryl sutures and a running barbed 3-0 suture.  The skin was then reinforced with Dermabond and a sterile dressing was placed.   The patient was awoken from anesthesia transferred off of the operating room  table onto a hospital bed where examination of leg lengths found the leg lengths to be equal with a good distal pulse.  The patient was then transferred to the PACU in stable condition.

## 2022-12-22 NOTE — H&P (View-Only) (Signed)
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 female with medical history significant of COPD, bilateral total knee replacements, HLD, Hypothyroidism, GERDHTN,  B/L Djd of the hip joints and recent right hip arthroplasty (February 2024; Dr. Landau) with plan for  THR Left in October 2024 who presented to the emergency room with progressively worsening left hip pain over the past few days.  Patient denies any trauma or acute injury to her hip.  She reports she has had hip pain for over a year now in both hips and did well with her recent right posterior hip replacement February.  They are planning to do the left hip next in October when her pain suddenly worsens and.  The patient reports she has been taking Celebrex for the pain and has undergone conservative treatments prior to this admission without improvement of pain or function of her left hip.  Due to inability to ambulate the patient was brought to the emergency room for evaluation of was found to have an acute change of the femoral head.  She denied any chest pain, increasing or worsening shortness of breath, headache, nausea, or vomiting.  The patient is a non-smoker she is a BMI of 36, she does not have diabetes with a recent A1c of 5.5  Past Medical History:  Diagnosis Date   Anemia    Arthritis    Barrett's esophagus 2012   2010 upper endoscopy showed only reflux. 2012 biopsies suggested Barrett's epithelial changes.   Bladder tumor    BPPV (benign paroxysmal positional vertigo) 2004   Cancer (HCC) 01/2018   bladder with low potential malignancy. tumor removed and did not require further treatment   Carotid artery narrowing 08/14/2014   Coronary artery disease 01/2018   sees Dr. 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   COLONOSCOPY WITH PROPOFOL N/A 01/20/2018   Procedure: COLONOSCOPY WITH PROPOFOL;  Surgeon: Wohl, Darren, MD;  Location: [facility];  Service: Endoscopy;  Laterality: N/A;  sleep apnea   ESOPHAGOGASTRODUODENOSCOPY (EGD) WITH PROPOFOL N/A 03/15/2016   Procedure: ESOPHAGOGASTRODUODENOSCOPY (EGD) WITH PROPOFOL;  Surgeon: Jeffrey W Byrnett, MD;  Location: [facility];  Service: Endoscopy;  Laterality: N/A;   ESOPHAGOGASTRODUODENOSCOPY (  EGD) WITH PROPOFOL N/A 05/11/2018   Procedure: ESOPHAGOGASTRODUODENOSCOPY (EGD) WITH PROPOFOL;  Surgeon: Wohl, Darren, MD;  Location: [facility];  Service: Endoscopy;  Laterality: N/A;  sleep apnea   EYE SURGERY Bilateral 2006   cataract   LAPAROSCOPIC SIGMOID COLECTOMY N/A 02/13/2018   Procedure: LAPAROSCOPIC SIGMOID COLECTOMY;  Surgeon: Pabon, Diego F, MD;  Location: [facility];  Service: General;  Laterality: N/A;   Left wrist fracture Left 1980   pins removed   POPLITEAL SYNOVIAL CYST EXCISION Right     TOTAL KNEE ARTHROPLASTY Right 08/09/2016   Procedure: TOTAL KNEE ARTHROPLASTY;  Surgeon: Wainer, Robert, MD;  Location: [facility];  Service: Orthopedics;  Laterality: Right;   TOTAL KNEE ARTHROPLASTY Left 09/19/2017   Procedure: TOTAL KNEE ARTHROPLASTY;  Surgeon: Wainer, Robert, MD;  Location: [facility];  Service: Orthopedics;  Laterality: Left;   TRANSURETHRAL RESECTION OF BLADDER TUMOR N/A 11/20/2014   Procedure: TRANSURETHRAL RESECTION OF BLADDER TUMOR (TURBT);  Surgeon: Ashley Brandon, MD;  Location: [facility];  Service: Urology;  Laterality: N/A;   UPPER GI ENDOSCOPY  2012, 2014   Social History   Socioeconomic History   Marital status: Widowed    Spouse name: Not on file   Number of children: 2   Years of education: Not on file   Highest education level: Not on file  Occupational History   Occupation: Retired  Tobacco Use   Smoking status: Former    Packs/day: 0.50    Years: 20.00    Additional pack years: 0.00    Total pack years: 10.00    Types: Cigarettes    Quit date: 06/20/1977    Years since quitting: 45.5   Smokeless tobacco: Never  Vaping Use   Vaping Use: Never used  Substance and Sexual Activity   Alcohol use: Yes    Alcohol/week: 1.0 - 3.0 standard drink of alcohol    Types: 1 - 2 Standard drinks or equivalent per week    Comment: occasional   Drug use: No   Sexual activity: Not on file  Other Topics Concern   Not on file  Social History Narrative   Not on file   Social Determinants of Health   Financial Resource Strain: Not on file  Food Insecurity: No Food Insecurity (12/22/2022)   Hunger Vital Sign    Worried About Running Out of Food in the Last Year: Never true    Ran Out of Food in the Last Year: Never true  Transportation Needs: No Transportation Needs (12/22/2022)   PRAPARE - Transportation    Lack of Transportation (Medical): No    Lack of Transportation (Non-Medical): No  Physical Activity: Not on file  Stress: Not on file  Social Connections: Not on  file   Family History  Problem Relation Age of Onset   Heart disease Mother        CHF, CAD   Hypertension Mother    Mental illness Mother        Dementia   Cancer Mother        breast, Brain Cancer   Breast cancer Mother 85   Cancer Father        lung   Heart disease Father        MI   Heart disease Sister    Cancer Sister        Breast   Barrett's esophagus Sister    Hyperlipidemia Sister    Breast cancer Sister 55   Asthma Brother    Heart disease   Brother    Diabetes Brother    Anxiety disorder Brother    Neuropathy Brother    Neuropathy Sister    Anxiety disorder Sister    Mental illness Sister        Dementia   Fibromyalgia Sister    Allergies  Allergen Reactions   Carvedilol Other (See Comments)    No energy, fatigued - higher doses   Hctz [Hydrochlorothiazide] Other (See Comments)    Abnormal labs hyponatremia   Spironolactone Other (See Comments)    Hyponatremia   Prior to Admission medications   Medication Sig Start Date End Date Taking? Authorizing Provider  albuterol (VENTOLIN HFA) 108 (90 Base) MCG/ACT inhaler Inhale 2 puffs into the lungs every 4 (four) hours as needed. 01/12/22  Yes Murray, Barbara Hernandez Wilson, MD  Artificial Tear Solution (GENTEAL TEARS OP) Apply 1 application to eye at bedtime.   Yes [provider]  aspirin EC 81 MG tablet Take 81 mg by mouth at bedtime.    Yes [provider]  Calcium Carbonate-Vit D-Min (CALCIUM 1200 PO) Take 1 tablet by mouth daily.    Yes [provider]  carvedilol (COREG) 3.125 MG tablet Take by mouth. 04/15/20 12/22/22 Yes [provider]  celecoxib (CELEBREX) 200 MG capsule Take 200 mg by mouth daily.    Yes [provider]  Cholecalciferol (VITAMIN D3) 2000 UNITS TABS Take 2,000 Units by mouth every evening.    Yes [provider]  diclofenac Sodium (VOLTAREN ARTHRITIS PAIN) 1 % GEL Apply 2 g topically 4 (four) times daily as needed. 01/12/22  Yes Murray, Barbara Hernandez  Wilson, MD  Ipratropium-Albuterol (COMBIVENT) 20-100 MCG/ACT AERS respimat Inhale 1 puff into the lungs every 6 (six) hours as needed. 10/27/22 10/27/23 Yes [provider]  lisinopril (PRINIVIL,ZESTRIL) 40 MG tablet Take 1 tablet (40 mg total) by mouth daily. bedtime Patient taking differently: Take 40 mg by mouth daily. 05/13/15  Yes Maloney, Nancy, MD  Multiple Vitamin (MULTIVITAMIN WITH MINERALS) TABS tablet Take 1 tablet by mouth daily.   Yes [provider]  omeprazole (PRILOSEC) 20 MG capsule Take 1 capsule (20 mg total) by mouth daily. 04/15/15  Yes Maloney, Nancy, MD  simvastatin (ZOCOR) 20 MG tablet Take 1 tablet (20 mg total) by mouth daily. bedtime 05/13/15  Yes Maloney, Nancy, MD  SYNTHROID 100 MCG tablet Take 100 mcg by mouth daily before breakfast. 12/17/22  Yes [provider]  vitamin B-12 (CYANOCOBALAMIN) 1000 MCG tablet Take 1,000 mcg by mouth daily.   Yes [provider]  amLODipine (NORVASC) 5 MG tablet Take by mouth. 02/15/20 12/24/21  [provider]  Spacer/Aero-Holding Chambers (AEROCHAMBER MV) inhaler Use as instructed Patient not taking: Reported on 12/22/2022 12/24/21   Ryan, Jeremy, NP   CT HIP LEFT WO CONTRAST  Result Date: 12/21/2022 CLINICAL DATA:  Left hip pain EXAM: CT OF THE LEFT HIP WITHOUT CONTRAST TECHNIQUE: Multidetector CT imaging of the left hip was performed according to the standard protocol. Multiplanar CT image reconstructions were also generated. RADIATION DOSE REDUCTION: This exam was performed according to the departmental dose-optimization program which includes automated exposure control, adjustment of the mA and/or kV according to patient size and/or use of iterative reconstruction technique. COMPARISON:  Plain film from earlier in the same day. FINDINGS: Bones/Joint/Cartilage Severe degenerative changes of the left hip joint are noted with some subchondral sclerosis and cyst formation in the acetabulum. Some remodeling  of the lateral margin of the acetabulum is noted superiorly. Remodeling of the   femoral head is noted as well. Additionally there is a vertical fracture line identified through the deformed femoral head with some impaction at the fracture site. No other fracture is noted. The remainder of the visualized pelvis is within normal limits. Ligaments Suboptimally assessed by CT. Muscles and Tendons Surrounding musculature appears within normal limits. Soft tissues Pelvic soft tissue structures are unremarkable. No hematoma is seen. IMPRESSION: Vertical fracture line through a chronically deformed left femoral head. The overall appearance is similar to that seen on recent plain film. Significant degenerative changes involving the acetabulum on the left. Electronically Signed   By: Mark  Lukens M.D.   On: 12/21/2022 23:31   DG Chest Portable 1 View  Result Date: 12/21/2022 CLINICAL DATA:  Preop hip fracture EXAM: PORTABLE CHEST 1 VIEW COMPARISON:  12/28/2021 FINDINGS: No acute airspace disease. Stable cardiomediastinal silhouette with aortic atherosclerosis. No pneumothorax IMPRESSION: No active disease. Electronically Signed   By: Kim  Fujinaga M.D.   On: 12/21/2022 22:52   DG Hip Unilat W or Wo Pelvis 2-3 Views Left  Result Date: 12/21/2022 CLINICAL DATA:  Left hip pain. EXAM: DG HIP (WITH OR WITHOUT PELVIS) 2-3V LEFT COMPARISON:  Radiograph dated 01/12/2022. FINDINGS: There is a minimally displaced fracture of the left femoral head/neck junction. There is mild superior migration of the femoral shaft and impaction on the acetabular roof. 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 No dislocation. Severe arthritic changes of the left hip with near complete loss of joint space. The bones are osteopenic. Total right hip arthroplasty. The soft tissues are unremarkable. IMPRESSION: 1. Minimally displaced fracture of the left  femoral head/neck junction. 2. 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 Electronically Signed   By: Arash  Radparvar M.D.   On: 12/21/2022 21:47    Positive ROS: All other systems have been reviewed and were otherwise negative with the exception of those mentioned in the HPI and as above.  Physical Exam: General:  Alert, no acute distress Psychiatric:  Patient is competent for consent with normal mood and affect   Cardiovascular:  No pedal edema Respiratory:  No wheezing, non-labored breathing GI:  Abdomen is soft and non-tender Skin:  No lesions in the area of chief complaint Neurologic:  Sensation intact distally Lymphatic:  No axillary or cervical lymphadenopathy  Orthopedic Exam:  Left lower extremity Leg is slightly shortened and externally rotated relative to contralateral Skin intact with some swelling and tenderness over the lateral hip Anterior to soft tissues are well-appearing with no intertriginous erythema at the groin No tenderness palpation over the knee distal femur, tibia, ankle, or foot All compartments soft Able to dorsiflex and plantarflex foot and toes neurovascular intact with good dorsalis pedis pulse and sensation intact  Secondary survey No tenderness to palpation over other bony prominences in the lower extremities or bilateral upper extremities No pain with logroll or simulated axial loading of the right lower extremity All compartments soft No tenderness to palpation over the cervical or thoracic spine, no bony step-off Motor grossly intact throughout, no focal deficits Sensation grossly intact throughout, no focal deficits Good distal pulses and capillary refill on all extremities   X-rays:  X-rays and CT scan images and report reviewed myself.  Patient has chronic deformity of the left femoral head with degenerative changes of the acetabulum with subchondral cysts and sclerosis.  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  There are no fractures noted in the femoral neck or intertrochanteric region.  The bone quality of the proximal femur appears good with thick cortices ongoing calcar and lateral femoral cortex.  Patient has a right total hip replacement with press-fit components which appears in good position no evidence of periprosthetic fracture or loosening.  DEXA scan reviewed from 2019 showing mild osteopenia no frank osteoporosis.  Overall  Assessment: Left femoral head AVN/femoral head fracture  Plan: Barbara Hernandez is a 81-year-old female who presents with a displaced left femoral head fracture in the setting of chronic degeneration and what appears to be AVN on CT scan.  There is no evidence of any effusion or ongoing inflammatory process with a negative ESR and CRP.  This reassures me that the there is rapid progression of hip deformity and collapse is secondary to an avascular process versus an infectious process.  I reviewed the imaging and plan with the patient.  She was previously scheduled to undergo a left total replacement a few months for this known degeneration in her hip.  As the femoral head has now acutely failed we discussed the option of moving forward with her total hip replacement at this time.  We reviewed alternative options including the option of nonoperative management patient would like to proceed with surgery at this time.  We discussed approaches and options for the surgery and after an extensive conversation about the risks and benefits we have elected move forward with an anterior left total hip replacement.  A long discussion took place with the patient describing what a anterior total hip replacement is and what the procedure would entail. The xrays were reviewed with the patient and the implants were discussed. The ability to secure the implant utilizing screws/cement/ or cementless (press fit) fixation was discussed. Surgical exposures were  discussed with the patient.    The hospitalization and post-operative care and rehabilitation were also discussed. The use of perioperative antibiotics and DVT prophylaxis were discussed. The risk, benefits and alternatives to a surgical intervention were discussed at length with the patient. The patient was also advised of risks related to the medical comorbidities. A lengthy discussion took place to review the most common complications including but not limited to: deep vein thrombosis, pulmonary embolus, heart attack, stroke, infection, wound breakdown, dislocation, numbness, leg length in-equality, damage to nerves, intraoperative fracture, tendon,muscles, arteries or other blood vessels, death and other possible complications from anesthesia. The patient was told that we will take steps to minimize these risks by using sterile technique, antibiotics and DVT prophylaxis when appropriate and follow the patient postoperatively in the office setting to monitor progress. The possibility of recurrent pain, no improvement in pain and actual worsening of pain were also discussed with the patient. The risk of dislocation following surgery was discussed and potential precautions to prevent dislocation were reviewed.       The benefits of surgery were discussed with the patient including the potential for improving the patient's current clinical condition through operative intervention. Alternatives to surgical intervention including conservative management were also discussed in detail. All questions were answered to the satisfaction of the patient. The patient participated and agreed to the plan of care as well as the use of the recommended implants for their surgery.    Plan for surgery today 12/22/2022 in the afternoon hopefully around 3 PM N.p.o. for the operating room Hold anticoagulation for the OR Will obtain consent in preoperative holding just   before surgery Medical clearance/optimization for the  OR   Dexter Signor MD  Beeper #:  [phone]  12/22/2022 8:21 AM  

## 2022-12-22 NOTE — Transfer of Care (Signed)
Immediate Anesthesia Transfer of Care Note  Patient: Barbara Hernandez  Procedure(s) Performed: TOTAL HIP ARTHROPLASTY ANTERIOR APPROACH (Left: Hip)  Patient Location: PACU  Anesthesia Type:Spinal  Level of Consciousness: awake, drowsy, and patient cooperative  Airway & Oxygen Therapy: Patient Spontanous Breathing  Post-op Assessment: Report given to RN and Post -op Vital signs reviewed and stable  Post vital signs: Reviewed and stable  Last Vitals:  Vitals Value Taken Time  BP 110/56 12/22/22 1733  Temp 36.3 C 12/22/22 1733  Pulse 61 12/22/22 1735  Resp 16 12/22/22 1735  SpO2 95 % 12/22/22 1735  Vitals shown include unvalidated device data.  Last Pain:  Vitals:   12/22/22 1733  TempSrc:   PainSc: 0-No pain      Patients Stated Pain Goal: 0 (12/22/22 1418)  Complications: No notable events documented.

## 2022-12-22 NOTE — Progress Notes (Signed)
Nutrition Brief Note  RD consulted for assessment of nutritional requirements/ status.   Wt Readings from Last 15 Encounters:  12/21/22 90.3 kg  12/24/21 90.3 kg  07/01/20 90.3 kg  06/27/18 102.1 kg  06/19/18 100.2 kg  05/11/18 98 kg  03/29/18 98.9 kg  03/22/18 98.4 kg  03/09/18 97.5 kg  03/01/18 97.1 kg  02/23/18 93.9 kg  02/21/18 97.5 kg  02/06/18 98.9 kg  01/25/18 98 kg  01/20/18 96.6 kg   Pt with medical history significant of COPD, bilateral total knee replacements, HLD, Hypothyroidism, GERDHTN,  B/L Djd of the hip joints and recent right hip arthroplasty (February 2024; Dr. Dion Saucier) with plan for  [facility] Left in October 2024 who presents with progressive pain of left hip over the last few days PTA.  Pt admitted with lt femur fracture.   Reviewed I/O's: -1.9 L x 24 hours  Pt currently NPO for surgery. This was confirmed by nursing at bedside.   Spoke with pt at bedside, who was pleasant and in good spirits today. She reports that she had a good appetite PTA consuming 3 meals per day (meat, starch, and vegetable). Pt has been incorporating more lean proteins and vegetables in her diet.   Pt denies any weight loss. Noted wt has been stable. Pt shares her wt was around 206# on admission.   Discussed importance of good meal intake to promote healing. Pt with no further questions or needs, but appreciative of RD visit.   Medications reviewed and include vitamin D3 and 01.9% sodium chloride infusion @ 75 ml/hr.   Nutrition-Focused physical exam completed. Findings are no fat depletion, no muscle depletion, and no edema.    No results found for: "HGBA1C" PTA DM medications are none.   Labs reviewed: CBGS: 107 (inpatient orders for glycemic control are none).    Body mass index is 36.4 kg/m. 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 Obesity is a complex, chronic medical condition that is optimally managed by a multidisciplinary care team. Weight loss is  not an ideal goal for an acute inpatient hospitalization. However, if further work-up for obesity is warranted, consider outpatient referral to [place].    Current diet order is NPO, patient is consuming approximately n/a% of meals at this time. Labs and medications reviewed.   No nutrition interventions warranted at this time. If nutrition issues arise, please consult RD.   Levada Schilling, RD, LDN, CDCES Registered Dietitian II Certified Diabetes Care and Education Specialist Please refer to [facility] for RD and/or RD on-call/weekend/after hours pager

## 2022-12-22 NOTE — Plan of Care (Signed)

## 2022-12-22 NOTE — Progress Notes (Addendum)
Interval events noted.  She complains of left hip pain but this has improved with IV morphine.  No other complaints.  No shortness of breath, chest pain or dizziness.  Plan for left hip surgery  today for left femoral head fracture.

## 2022-12-22 NOTE — Anesthesia Preprocedure Evaluation (Addendum)
Anesthesia Evaluation  Patient identified by MRN, date of birth, ID band Patient awake    Reviewed: Allergy & Precautions, NPO status , Patient's Chart, lab work & pertinent test results  History of Anesthesia Complications Negative for: history of anesthetic complications  Airway Mallampati: I   Neck ROM: Full    Dental  (+) Upper Dentures, Lower Dentures   Pulmonary sleep apnea and Continuous Positive Airway Pressure Ventilation , COPD, former smoker (quit 1978)   Pulmonary exam normal breath sounds clear to auscultation       Cardiovascular hypertension, + CAD  Normal cardiovascular exam Rhythm:Regular Rate:Normal  ECG 12/24/21:  Sinus bradycardia Otherwise normal ECG  Echo 03/25/22: normal   Neuro/Psych negative neurological ROS     GI/Hepatic ,GERD (Barrett esophagus)  ,,  Endo/Other  Hypothyroidism  Obesity   Renal/GU negative Renal ROS     Musculoskeletal  (+) Arthritis ,    Abdominal   Peds  Hematology  (+) Blood dyscrasia, anemia   Anesthesia Other Findings Cardiology note 03/30/22:  Assessment   82 y.o. female with  Encounter Diagnoses  Name Primary?  Benign essential hypertension Yes  Obstructive sleep apnea syndrome  SOBOE (shortness of breath on exertion)   Plan   -No further intervention shortness of breath reported above multifactorial in nature including age, decreased exercise tolerance, disability, and/or medication management. The patient is to follow for any worsening symptoms or increase in severity for further in need in investigation or treatment options.  -There has been an ongoing discussion about the appropriate use the CPAP machine to reduce cardiovascular symptoms and improvements of quality of life. The patient understands that CPAP machine is very important in this measure. The patient understands that the hygiene and upkeep of the equipment is most important as well. This will  insure resolution of all symptoms associated with sleep apnea. -The patient has been instructed to adhere to improving healthy lifestyle. We have specifically addressed the most important factors including abstinence of tobacco use, regular physical activity including the possibility of exercise prescription, healthy diet, and maintaining an appropriate body mass index. -No further cardiac intervention at this time due to normal stress test without evidence of myocardial ischemia. Patient is instructed to continue to follow for any new symptoms and issues which may be consistent with anginal equivalent and report them for further evaluation.  No orders of the defined types were placed in this encounter.  Return if symptoms worsen or fail to improve.    Reproductive/Obstetrics                             Anesthesia Physical Anesthesia Plan  ASA: 3  Anesthesia Plan: Spinal   Post-op Pain Management:    Induction:   PONV Risk Score and Plan:   Airway Management Planned:   Additional Equipment:   Intra-op Plan:   Post-operative Plan:   Informed Consent:   Plan Discussed with:   Anesthesia Plan Comments:         Anesthesia Quick Evaluation

## 2022-12-22 NOTE — Progress Notes (Signed)
OT Cancellation Note  Patient Details Name: Barbara Hernandez MRN: [id number] DOB: December 16, 1940   Cancelled Treatment:    Reason Eval/Treat Not Completed: Medical issues which prohibited therapy.  Imaging showed "Minimally displaced fracture of the left femoral head/neck junction." Ortho has been consulted, awaiting their recommendations. Will hold OT evaluation at this time.   Kathie Dike, M.S. OTR/L  12/22/22, 8:05 AM  ascom [phone]

## 2022-12-22 NOTE — Progress Notes (Signed)
   12/22/22 2117  BiPAP/CPAP/SIPAP  $ Non-Invasive Home Ventilator  Initial  BiPAP/CPAP/SIPAP Pt Type Adult  BiPAP/CPAP/SIPAP  (HOME UNIT)  Mask Type Nasal pillows  Mask Size Medium  Respiratory Rate 17 breaths/min  EPAP  (HOME REGIME SETTINGS)  FiO2 (%) 21 %  Patient Home Equipment Yes  Auto Titrate Yes  BiPAP/CPAP /SiPAP Vitals  Pulse Rate 78  Resp 17  SpO2 94 %  Bilateral Breath Sounds Clear   Patient home unit observed to be in proper working condition. No loose or frayed cords. Home unit plugged into red outlet.

## 2022-12-22 NOTE — H&P (Signed)
History and Physical    [name]:[id number] DOB: 1940/07/27 DOA: 12/21/2022  PCP: Rayetta Humphrey, MD  Patient coming from: home  I have personally briefly reviewed patient's old medical records in [facility]  Chief Complaint:   HPI: Barbara Hernandez is a 82 y.o. female with medical history significant of COPD, bilateral total knee replacements, HLD, Hypothyroidism, GERDHTN,  B/L Djd of the hip joints and recent right hip arthroplasty (February 2024; Dr. Dion Saucier) with plan for  [facility] in October 2024 who presents with progressive pain of left hip over the last few days with acute worsening this am. Patient states after attempting to get up form sitting position she noted she was unable to bear weight on her left leg and noted significant increase in hip pain. Due to this EMS was called and patient was brought to ED.  ED Course:  IN ED patient was found to have left hip fracture  Afeb, bp 160/70, hr 76, rr 14, sat 99%   Xray left hip IMPRESSION: 1. Minimally displaced fracture of the left femoral head/neck junction. 2. 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   CT Left hip IMPRESSION: Vertical fracture line through a chronically deformed left femoral head. The overall appearance is similar to that seen on recent plain film.   Significant degenerative changes involving the acetabulum on the left. Wbc: 9, hgb 13.3 , plt 252 UA:neg Na 131 (133), k 4.1, cr 0.54 Tx vicodin  Review of Systems: As per HPI otherwise 10 point review of systems negative.   Past Medical History:  Diagnosis Date   Anemia    Arthritis    Barrett's esophagus 2012   2010 upper endoscopy showed only reflux. 2012 biopsies suggested Barrett's epithelial changes.   Bladder tumor    BPPV (benign paroxysmal positional vertigo) 2004   Cancer [facility]) 01/2018   bladder with low potential malignancy. tumor removed and did not require further treatment   Carotid artery narrowing 08/14/2014    Coronary artery disease 01/2018   sees Dr. 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   COLONOSCOPY WITH PROPOFOL N/A 01/20/2018   Procedure: COLONOSCOPY WITH PROPOFOL;  Surgeon: Midge Minium, MD;  Location: [facility];  Service: Endoscopy;  Laterality: N/A;  sleep apnea   ESOPHAGOGASTRODUODENOSCOPY (EGD) WITH PROPOFOL  N/A 03/15/2016   Procedure: ESOPHAGOGASTRODUODENOSCOPY (EGD) WITH PROPOFOL;  Surgeon: [name], MD;  Location: [facility];  Service: Endoscopy;  Laterality: N/A;   ESOPHAGOGASTRODUODENOSCOPY (EGD) WITH PROPOFOL N/A 05/11/2018   Procedure: ESOPHAGOGASTRODUODENOSCOPY (EGD) WITH PROPOFOL;  Surgeon: Midge Minium, MD;  Location: [facility];  Service: Endoscopy;  Laterality: N/A;  sleep apnea   EYE SURGERY Bilateral 2006   cataract   LAPAROSCOPIC SIGMOID COLECTOMY N/A 02/13/2018   Procedure: LAPAROSCOPIC SIGMOID COLECTOMY;  Surgeon: Leafy Ro, MD;  Location: [facility] ORS;  Service: General;  Laterality: N/A;    Left wrist fracture Left 1980   pins removed   POPLITEAL SYNOVIAL CYST EXCISION Right    TOTAL KNEE ARTHROPLASTY Right 08/09/2016   Procedure: TOTAL KNEE ARTHROPLASTY;  Surgeon: Salvatore Marvel, MD;  Location: [facility] OR;  Service: Orthopedics;  Laterality: Right;   TOTAL KNEE ARTHROPLASTY Left 09/19/2017   Procedure: TOTAL KNEE ARTHROPLASTY;  Surgeon: Salvatore Marvel, MD;  Location: [facility] OR;  Service: Orthopedics;  Laterality: Left;   TRANSURETHRAL RESECTION OF BLADDER TUMOR N/A 11/20/2014   Procedure: TRANSURETHRAL RESECTION OF BLADDER TUMOR (TURBT);  Surgeon: Vanna Scotland, MD;  Location: [facility] ORS;  Service: Urology;  Laterality: N/A;   UPPER GI ENDOSCOPY  2012, 2014     reports that she quit smoking about 45 years ago. Her smoking use included cigarettes. She has a 10.00 pack-year smoking history. She has never used smokeless tobacco. She reports current alcohol use of about 1.0 - 3.0 standard drink of alcohol per week. She reports that she does not use drugs.  Allergies  Allergen Reactions   Carvedilol Other (See Comments)    No energy, fatigued - higher doses   Hctz [Hydrochlorothiazide] Other (See Comments)    Abnormal labs hyponatremia   Spironolactone Other (See Comments)    Hyponatremia    Family History  Problem Relation Age of Onset   Heart disease Mother        CHF, CAD   Hypertension Mother    Mental illness Mother        Dementia   Cancer Mother        breast, Brain Cancer   Breast cancer Mother 42   Cancer Father        lung   Heart disease Father        MI   Heart disease Sister    Cancer Sister        Breast   Barrett's esophagus Sister    Hyperlipidemia Sister    Breast cancer Sister 17   Asthma Brother    Heart disease Brother    Diabetes Brother    Anxiety disorder Brother    Neuropathy Brother    Neuropathy Sister    Anxiety disorder Sister    Mental illness Sister        Dementia   Fibromyalgia Sister     Prior to Admission medications   Medication  Sig Start Date End Date Taking? Authorizing Provider  albuterol (VENTOLIN HFA) 108 (90 Base) MCG/ACT inhaler Inhale 2 puffs into the lungs every 4 (four) hours as needed. 01/12/22   Isa Rankin, MD  amLODipine (NORVASC) 5 MG tablet Take by mouth. 02/15/20 12/24/21  [provider]  Artificial Tear Solution (GENTEAL TEARS OP) Apply 1 application to eye at bedtime.    [provider]  aspirin EC 81 MG tablet Take 81 mg by mouth at bedtime.     [provider]  azithromycin (ZITHROMAX Z-PAK) 250 MG tablet  Take as directed on package 12/28/21   Candis Schatz, PA-C  benzonatate (TESSALON) 100 MG capsule Take 2 capsules (200 mg total) by mouth every 8 (eight) hours. 12/24/21   Becky Augusta, NP  Calcium Carbonate-Vit D-Min (CALCIUM 1200 PO) Take 1 tablet by mouth daily.     [provider]  carvedilol (COREG) 3.125 MG tablet Take by mouth. 04/15/20 12/24/21  [provider]  celecoxib (CELEBREX) 200 MG capsule Take 200 mg by mouth daily.     [provider]  Cholecalciferol (VITAMIN D3) 2000 UNITS TABS Take 2,000 Units by mouth every evening.     [provider]  diclofenac Sodium (VOLTAREN ARTHRITIS PAIN) 1 % GEL Apply 2 g topically 4 (four) times daily as needed. 01/12/22   Isa Rankin, MD  Docusate Sodium (DSS) 100 MG CAPS Take by mouth.    [provider]  levothyroxine (SYNTHROID, LEVOTHROID) 125 MCG tablet Take 1 tablet (125 mcg total) by mouth daily. 09/11/15   Lorie Phenix, MD  lisinopril (PRINIVIL,ZESTRIL) 40 MG tablet Take 1 tablet (40 mg total) by mouth daily. bedtime Patient taking differently: Take 40 mg by mouth daily. 05/13/15   Lorie Phenix, MD  Multiple Vitamin (MULTIVITAMIN WITH MINERALS) TABS tablet Take 1 tablet by mouth daily.    [provider]  omeprazole (PRILOSEC) 20 MG capsule Take 1 capsule (20 mg total) by mouth daily. 04/15/15   Lorie Phenix, MD  promethazine-dextromethorphan  (PROMETHAZINE-DM) 6.25-15 MG/5ML syrup Take 5 mLs by mouth 4 (four) times daily as needed. 12/24/21   Becky Augusta, NP  simvastatin (ZOCOR) 20 MG tablet Take 1 tablet (20 mg total) by mouth daily. bedtime 05/13/15   Lorie Phenix, MD  Spacer/Aero-Holding Chambers (AEROCHAMBER MV) inhaler Use as instructed 12/24/21   Becky Augusta, NP  vitamin B-12 (CYANOCOBALAMIN) 1000 MCG tablet Take 1,000 mcg by mouth daily.    [provider]    Physical Exam: Vitals:   12/21/22 1930 12/21/22 1932 12/21/22 2330 12/21/22 2340  BP:  (!) 160/70 (!) 150/80   Pulse:  76 88   Resp:  14 18   Temp:  97.9 F (36.6 C) 98.1 F (36.7 C) 98.2 F (36.8 C)  TempSrc:  Oral Oral Oral  SpO2:  99% 96%   Weight: 90.3 kg     Height: 5\' 2"  (1.575 m)       Constitutional: NAD, calm, comfortable Vitals:   12/21/22 1930 12/21/22 1932 12/21/22 2330 12/21/22 2340  BP:  (!) 160/70 (!) 150/80   Pulse:  76 88   Resp:  14 18   Temp:  97.9 F (36.6 C) 98.1 F (36.7 C) 98.2 F (36.8 C)  TempSrc:  Oral Oral Oral  SpO2:  99% 96%   Weight: 90.3 kg     Height: 5\' 2"  (1.575 m)      Eyes: PERRL, lids and conjunctivae normal ENMT: Mucous membranes are moist. Posterior pharynx clear of any exudate or lesions.Normal dentition.  Neck: normal, supple, no masses, no thyromegaly Respiratory: clear to auscultation bilaterally, no wheezing, no crackles. Normal respiratory effort. No accessory muscle use.  Cardiovascular: Regular rate and rhythm, + murmurs / no rubs /no gallops. No extremity edema. 2+ pedal pulses.  Abdomen: no tenderness, no masses palpated. No hepatosplenomegaly. Bowel sounds positive.  Musculoskeletal: no clubbing / cyanosis. Left shorter than right slightly externally rotated no contractures. Normal muscle tone.  Skin: no rashes, lesions, ulcers. No induration Neurologic: CN 2-12 grossly intact. Sensation intact, MAE except left  due to pain/fx  Psychiatric: Normal judgment and insight. Alert and oriented x  3. Normal mood.    Labs on Admission: I have personally reviewed following labs and imaging studies  CBC: Recent Labs  Lab 12/21/22 2233  WBC 9.0  NEUTROABS 7.4  HGB 13.3  HCT 42.7  MCV 87.9  PLT 252   Basic Metabolic Panel: Recent Labs  Lab 12/21/22 2233  NA 131*  K 4.1  CL 100  CO2 23  GLUCOSE 112*  BUN 23  CREATININE 0.54  CALCIUM 9.3   GFR: Estimated Creatinine Clearance: 59.6 mL/min (by C-G formula based on SCr of 0.54 mg/dL). Liver Function Tests: No results for input(s): "AST", "ALT", "ALKPHOS", "BILITOT", "PROT", "ALBUMIN" in the last 168 hours. No results for input(s): "LIPASE", "AMYLASE" in the last 168 hours. No results for input(s): "AMMONIA" in the last 168 hours. Coagulation Profile: Recent Labs  Lab 12/21/22 2225  INR 1.0   Cardiac Enzymes: No results for input(s): "CKTOTAL", "CKMB", "CKMBINDEX", "TROPONINI" in the last 168 hours. BNP (last 3 results) No results for input(s): "PROBNP" in the last 8760 hours. HbA1C: No results for input(s): "HGBA1C" in the last 72 hours. CBG: No results for input(s): "GLUCAP" in the last 168 hours. Lipid Profile: No results for input(s): "CHOL", "HDL", "LDLCALC", "TRIG", "CHOLHDL", "LDLDIRECT" in the last 72 hours. Thyroid Function Tests: No results for input(s): "TSH", "T4TOTAL", "FREET4", "T3FREE", "THYROIDAB" in the last 72 hours. Anemia Panel: No results for input(s): "VITAMINB12", "FOLATE", "FERRITIN", "TIBC", "IRON", "RETICCTPCT" in the last 72 hours. Urine analysis:    Component Value Date/Time   COLORURINE STRAW (A) 12/21/2022 2233   APPEARANCEUR CLEAR (A) 12/21/2022 2233   APPEARANCEUR Clear 07/01/2020 0957   LABSPEC 1.009 12/21/2022 2233   LABSPEC 1.015 06/17/2014 1204   PHURINE 6.0 12/21/2022 2233   GLUCOSEU NEGATIVE 12/21/2022 2233   GLUCOSEU NEGATIVE 06/17/2014 1204   HGBUR NEGATIVE 12/21/2022 2233   BILIRUBINUR NEGATIVE 12/21/2022 2233   BILIRUBINUR Negative 07/01/2020 0957   BILIRUBINUR  NEGATIVE 06/17/2014 1204   KETONESUR NEGATIVE 12/21/2022 2233   PROTEINUR NEGATIVE 12/21/2022 2233   NITRITE NEGATIVE 12/21/2022 2233   LEUKOCYTESUR NEGATIVE 12/21/2022 2233   LEUKOCYTESUR NEGATIVE 06/17/2014 1204    Radiological Exams on Admission: CT HIP LEFT WO CONTRAST  Result Date: 12/21/2022 CLINICAL DATA:  Left hip pain EXAM: CT OF THE LEFT HIP WITHOUT CONTRAST TECHNIQUE: Multidetector CT imaging of the left hip was performed according to the standard protocol. Multiplanar CT image reconstructions were also generated. RADIATION DOSE REDUCTION: This exam was performed according to the departmental dose-optimization program which includes automated exposure control, adjustment of the mA and/or kV according to patient size and/or use of iterative reconstruction technique. COMPARISON:  Plain film from earlier in the same day. FINDINGS: Bones/Joint/Cartilage Severe degenerative changes of the left hip joint are noted with some subchondral sclerosis and cyst formation in the acetabulum. Some remodeling of the lateral margin of the acetabulum is noted superiorly. Remodeling of the femoral head is noted as well. Additionally there is a vertical fracture line identified through the deformed femoral head with some impaction at the fracture site. No other fracture is noted. The remainder of the visualized pelvis is within normal limits. Ligaments Suboptimally assessed by CT. Muscles and Tendons Surrounding musculature appears within normal limits. Soft tissues Pelvic soft tissue structures are unremarkable. No hematoma is seen. IMPRESSION: Vertical fracture line through a chronically deformed left femoral head. The overall appearance is similar to that seen on recent plain film.  Significant degenerative changes involving the acetabulum on the left. Electronically Signed   By: Alcide Clever M.D.   On: 12/21/2022 23:31   DG Chest Portable 1 View  Result Date: 12/21/2022 CLINICAL DATA:  Preop hip fracture EXAM:  PORTABLE CHEST 1 VIEW COMPARISON:  12/28/2021 FINDINGS: No acute airspace disease. Stable cardiomediastinal silhouette with aortic atherosclerosis. No pneumothorax IMPRESSION: No active disease. Electronically Signed   By: Jasmine Pang M.D.   On: 12/21/2022 22:52   DG Hip Unilat W or Wo Pelvis 2-3 Views Left  Result Date: 12/21/2022 CLINICAL DATA:  Left hip pain. EXAM: DG HIP (WITH OR WITHOUT PELVIS) 2-3V LEFT COMPARISON:  Radiograph dated 01/12/2022. FINDINGS: There is a minimally displaced fracture of the left femoral head/neck junction. There is mild superior migration of the femoral shaft and impaction on the acetabular roof. 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 No dislocation. Severe arthritic changes of the left hip with near complete loss of joint space. The bones are osteopenic. Total right hip arthroplasty. The soft tissues are unremarkable. IMPRESSION: 1. Minimally displaced fracture of the left femoral head/neck junction. 2. 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 Electronically Signed   By: Elgie Collard M.D.   On: 12/21/2022 21:47    EKG: Independently reviewed. EKG pending  Assessment/Plan -admit to tele  -place on hip fracture protocol  -ortho consult  Dr Allena Katz awaiting further recs -supportive with pain medications  -patient is cleared for surgery and is a low risk for low-intermediate surgery  3.9% 30 day risk of death, MI or cardiac arrest based on Cardiac risk index for pre-operative risk   COPD -recently diagnosed -on bid combivent , no inhaled steroids /or LAMA -no acute exacerbation  -place on bid duonebs/ and  q6-4h prn    OSA -cpap qhs bilateral total knee replacements  HLD -resume statin     Hypothyroidism -resume synthroid   GERD -resume ppi   HTN -resume carvedilol , lisinopril, amlodipine     DVT prophylaxis: per ortho/ scd currently  Code  Status: full/ as discussed per patient wishes in event of cardiac arrest  Family Communication:   Cole,Kathleen (Daughter) [phone] ([place])   Disposition Plan: patient  expected to be admitted greater than 2 midnights  Consults called:  Dr Allena Katz ortho Admission status: med tele   Lurline Del MD [facility]   If 7PM-7AM, please contact night-coverage [email or web address] Password [email or web address]  12/22/2022, 12:40 AM

## 2022-12-22 NOTE — Consult Note (Signed)
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 female with medical history significant of COPD, bilateral total knee replacements, HLD, Hypothyroidism, GERDHTN,  B/L Djd of the hip joints and recent right hip arthroplasty (February 2024; Dr. Dion Saucier) with plan for  [facility] in October 2024 who presented to the emergency room with progressively worsening left hip pain over the past few days.  Patient denies any trauma or acute injury to her hip.  She reports she has had hip pain for over a year now in both hips and did well with her recent right posterior hip replacement February.  They are planning to do the left hip next in October when her pain suddenly worsens and.  The patient reports she has been taking Celebrex for the pain and has undergone conservative treatments prior to this admission without improvement of pain or function of her left hip.  Due to inability to ambulate the patient was brought to the emergency room for evaluation of was found to have an acute change of the femoral head.  She denied any chest pain, increasing or worsening shortness of breath, headache, nausea, or vomiting.  The patient is a non-smoker she is a BMI of 36, she does not have diabetes with a recent A1c of 5.5  Past Medical History:  Diagnosis Date   Anemia    Arthritis    Barrett's esophagus 2012   2010 upper endoscopy showed only reflux. 2012 biopsies suggested Barrett's epithelial changes.   Bladder tumor    BPPV (benign paroxysmal positional vertigo) 2004   Cancer [facility]) 01/2018   bladder with low potential malignancy. tumor removed and did not require further treatment   Carotid artery narrowing 08/14/2014   Coronary artery disease 01/2018   sees Dr. Gwen Pounds   Family history of adverse reaction to  anesthesia    sister had memory problems after delivery of child   GERD (gastroesophageal reflux disease)    H/O measles    Heart murmur    Hematuria    Hematuria    Hemorrhoid    Hemorrhoids    History of chicken pox    Hyperlipidemia    Hypertension    Hypothyroidism    Joint pain    Low sodium levels    Mild left ventricular hypertrophy    Onychomycosis    Perforation of sigmoid colon due to diverticulitis 09/26/2017   Primary localized osteoarthritis of left knee 09/06/2017   Reflux    Reflux esophagitis    S/P total knee replacement, 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   left knee replacement 09/2017   Sleep apnea 2011   Uses C-Pap machine   Thyroid disease    Wears dentures    full upper and lower   Past Surgical History:  Procedure Laterality Date   BLEPHAROPLASTY Bilateral 2015   BREAST EXCISIONAL BIOPSY Left 1988   benign   COLONOSCOPY W/ BIOPSIES N/A 01/16/2013   No source for GI blood loss, mild lymphatic prominence in the rectum.   COLONOSCOPY WITH PROPOFOL N/A 01/20/2018   Procedure: COLONOSCOPY WITH PROPOFOL;  Surgeon: Midge Minium, MD;  Location: [facility];  Service: Endoscopy;  Laterality: N/A;  sleep apnea   ESOPHAGOGASTRODUODENOSCOPY (EGD) WITH PROPOFOL N/A 03/15/2016   Procedure: ESOPHAGOGASTRODUODENOSCOPY (EGD) WITH PROPOFOL;  Surgeon: [name], MD;  Location: [facility] END[facility]COPY;  Service: Endoscopy;  Laterality: N/A;   ESOPHAGOGASTRODUODENOSCOPY (  EGD) WITH PROPOFOL N/A 05/11/2018   Procedure: ESOPHAGOGASTRODUODENOSCOPY (EGD) WITH PROPOFOL;  Surgeon: Midge Minium, MD;  Location: [facility];  Service: Endoscopy;  Laterality: N/A;  sleep apnea   EYE SURGERY Bilateral 2006   cataract   LAPAROSCOPIC SIGMOID COLECTOMY N/A 02/13/2018   Procedure: LAPAROSCOPIC SIGMOID COLECTOMY;  Surgeon: Leafy Ro, MD;  Location: [facility] ORS;  Service: General;  Laterality: N/A;   Left wrist fracture Left 1980   pins removed   POPLITEAL SYNOVIAL CYST EXCISION Right     TOTAL KNEE ARTHROPLASTY Right 08/09/2016   Procedure: TOTAL KNEE ARTHROPLASTY;  Surgeon: Salvatore Marvel, MD;  Location: [facility] OR;  Service: Orthopedics;  Laterality: Right;   TOTAL KNEE ARTHROPLASTY Left 09/19/2017   Procedure: TOTAL KNEE ARTHROPLASTY;  Surgeon: Salvatore Marvel, MD;  Location: [facility] OR;  Service: Orthopedics;  Laterality: Left;   TRANSURETHRAL RESECTION OF BLADDER TUMOR N/A 11/20/2014   Procedure: TRANSURETHRAL RESECTION OF BLADDER TUMOR (TURBT);  Surgeon: Vanna Scotland, MD;  Location: [facility] ORS;  Service: Urology;  Laterality: N/A;   UPPER GI ENDOSCOPY  2012, 2014   Social History   Socioeconomic History   Marital status: Widowed    Spouse name: Not on file   Number of children: 2   Years of education: Not on file   Highest education level: Not on file  Occupational History   Occupation: Retired  Tobacco Use   Smoking status: Former    Packs/day: 0.50    Years: 20.00    Additional pack years: 0.00    Total pack years: 10.00    Types: Cigarettes    Quit date: 06/20/1977    Years since quitting: 45.5   Smokeless tobacco: Never  Vaping Use   Vaping Use: Never used  Substance and Sexual Activity   Alcohol use: Yes    Alcohol/week: 1.0 - 3.0 standard drink of alcohol    Types: 1 - 2 Standard drinks or equivalent per week    Comment: occasional   Drug use: No   Sexual activity: Not on file  Other Topics Concern   Not on file  Social History Narrative   Not on file   Social Determinants of Health   Financial Resource Strain: Not on file  Food Insecurity: No Food Insecurity (12/22/2022)   Hunger Vital Sign    Worried About Running Out of Food in the Last Year: Never true    Ran Out of Food in the Last Year: Never true  Transportation Needs: No Transportation Needs (12/22/2022)   PRAPARE - Administrator, Civil Service (Medical): No    Lack of Transportation (Non-Medical): No  Physical Activity: Not on file  Stress: Not on file  Social Connections: Not on  file   Family History  Problem Relation Age of Onset   Heart disease Mother        CHF, CAD   Hypertension Mother    Mental illness Mother        Dementia   Cancer Mother        breast, Brain Cancer   Breast cancer Mother 86   Cancer Father        lung   Heart disease Father        MI   Heart disease Sister    Cancer Sister        Breast   Barrett's esophagus Sister    Hyperlipidemia Sister    Breast cancer Sister 63   Asthma Brother    Heart disease  Brother    Diabetes Brother    Anxiety disorder Brother    Neuropathy Brother    Neuropathy Sister    Anxiety disorder Sister    Mental illness Sister        Dementia   Fibromyalgia Sister    Allergies  Allergen Reactions   Carvedilol Other (See Comments)    No energy, fatigued - higher doses   Hctz [Hydrochlorothiazide] Other (See Comments)    Abnormal labs hyponatremia   Spironolactone Other (See Comments)    Hyponatremia   Prior to Admission medications   Medication Sig Start Date End Date Taking? Authorizing Provider  albuterol (VENTOLIN HFA) 108 (90 Base) MCG/ACT inhaler Inhale 2 puffs into the lungs every 4 (four) hours as needed. 01/12/22  Yes Isa Rankin, MD  Artificial Tear Solution (GENTEAL TEARS OP) Apply 1 application to eye at bedtime.   Yes [provider]  aspirin EC 81 MG tablet Take 81 mg by mouth at bedtime.    Yes [provider]  Calcium Carbonate-Vit D-Min (CALCIUM 1200 PO) Take 1 tablet by mouth daily.    Yes [provider]  carvedilol (COREG) 3.125 MG tablet Take by mouth. 04/15/20 12/22/22 Yes [provider]  celecoxib (CELEBREX) 200 MG capsule Take 200 mg by mouth daily.    Yes [provider]  Cholecalciferol (VITAMIN D3) 2000 UNITS TABS Take 2,000 Units by mouth every evening.    Yes [provider]  diclofenac Sodium (VOLTAREN ARTHRITIS PAIN) 1 % GEL Apply 2 g topically 4 (four) times daily as needed. 01/12/22  Yes Isa Rankin, MD  Ipratropium-Albuterol (COMBIVENT) 20-100 MCG/ACT AERS respimat Inhale 1 puff into the lungs every 6 (six) hours as needed. 10/27/22 10/27/23 Yes [provider]  lisinopril (PRINIVIL,ZESTRIL) 40 MG tablet Take 1 tablet (40 mg total) by mouth daily. bedtime Patient taking differently: Take 40 mg by mouth daily. 05/13/15  Yes Lorie Phenix, MD  Multiple Vitamin (MULTIVITAMIN WITH MINERALS) TABS tablet Take 1 tablet by mouth daily.   Yes [provider]  omeprazole (PRILOSEC) 20 MG capsule Take 1 capsule (20 mg total) by mouth daily. 04/15/15  Yes Lorie Phenix, MD  simvastatin (ZOCOR) 20 MG tablet Take 1 tablet (20 mg total) by mouth daily. bedtime 05/13/15  Yes Lorie Phenix, MD  SYNTHROID 100 MCG tablet Take 100 mcg by mouth daily before breakfast. 12/17/22  Yes [provider]  vitamin B-12 (CYANOCOBALAMIN) 1000 MCG tablet Take 1,000 mcg by mouth daily.   Yes [provider]  amLODipine (NORVASC) 5 MG tablet Take by mouth. 02/15/20 12/24/21  [provider]  Spacer/Aero-Holding Deretha Emory (AEROCHAMBER MV) inhaler Use as instructed Patient not taking: Reported on 12/22/2022 12/24/21   Becky Augusta, NP   CT HIP LEFT WO CONTRAST  Result Date: 12/21/2022 CLINICAL DATA:  Left hip pain EXAM: CT OF THE LEFT HIP WITHOUT CONTRAST TECHNIQUE: Multidetector CT imaging of the left hip was performed according to the standard protocol. Multiplanar CT image reconstructions were also generated. RADIATION DOSE REDUCTION: This exam was performed according to the departmental dose-optimization program which includes automated exposure control, adjustment of the mA and/or kV according to patient size and/or use of iterative reconstruction technique. COMPARISON:  Plain film from earlier in the same day. FINDINGS: Bones/Joint/Cartilage Severe degenerative changes of the left hip joint are noted with some subchondral sclerosis and cyst formation in the acetabulum. Some remodeling  of the lateral margin of the acetabulum is noted superiorly. Remodeling of the  femoral head is noted as well. Additionally there is a vertical fracture line identified through the deformed femoral head with some impaction at the fracture site. No other fracture is noted. The remainder of the visualized pelvis is within normal limits. Ligaments Suboptimally assessed by CT. Muscles and Tendons Surrounding musculature appears within normal limits. Soft tissues Pelvic soft tissue structures are unremarkable. No hematoma is seen. IMPRESSION: Vertical fracture line through a chronically deformed left femoral head. The overall appearance is similar to that seen on recent plain film. Significant degenerative changes involving the acetabulum on the left. Electronically Signed   By: Alcide Clever M.D.   On: 12/21/2022 23:31   DG Chest Portable 1 View  Result Date: 12/21/2022 CLINICAL DATA:  Preop hip fracture EXAM: PORTABLE CHEST 1 VIEW COMPARISON:  12/28/2021 FINDINGS: No acute airspace disease. Stable cardiomediastinal silhouette with aortic atherosclerosis. No pneumothorax IMPRESSION: No active disease. Electronically Signed   By: Jasmine Pang M.D.   On: 12/21/2022 22:52   DG Hip Unilat W or Wo Pelvis 2-3 Views Left  Result Date: 12/21/2022 CLINICAL DATA:  Left hip pain. EXAM: DG HIP (WITH OR WITHOUT PELVIS) 2-3V LEFT COMPARISON:  Radiograph dated 01/12/2022. FINDINGS: There is a minimally displaced fracture of the left femoral head/neck junction. There is mild superior migration of the femoral shaft and impaction on the acetabular roof. 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 No dislocation. Severe arthritic changes of the left hip with near complete loss of joint space. The bones are osteopenic. Total right hip arthroplasty. The soft tissues are unremarkable. IMPRESSION: 1. Minimally displaced fracture of the left  femoral head/neck junction. 2. 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 Electronically Signed   By: Elgie Collard M.D.   On: 12/21/2022 21:47    Positive ROS: All other systems have been reviewed and were otherwise negative with the exception of those mentioned in the HPI and as above.  Physical Exam: General:  Alert, no acute distress Psychiatric:  Patient is competent for consent with normal mood and affect   Cardiovascular:  No pedal edema Respiratory:  No wheezing, non-labored breathing GI:  Abdomen is soft and non-tender Skin:  No lesions in the area of chief complaint Neurologic:  Sensation intact distally Lymphatic:  No axillary or cervical lymphadenopathy  Orthopedic Exam:  Left lower extremity Leg is slightly shortened and externally rotated relative to contralateral Skin intact with some swelling and tenderness over the lateral hip Anterior to soft tissues are well-appearing with no intertriginous erythema at the groin No tenderness palpation over the knee distal femur, tibia, ankle, or foot All compartments soft Able to dorsiflex and plantarflex foot and toes neurovascular intact with good dorsalis pedis pulse and sensation intact  Secondary survey No tenderness to palpation over other bony prominences in the lower extremities or bilateral upper extremities No pain with logroll or simulated axial loading of the right lower extremity All compartments soft No tenderness to palpation over the cervical or thoracic spine, no bony step-off Motor grossly intact throughout, no focal deficits Sensation grossly intact throughout, no focal deficits Good distal pulses and capillary refill on all extremities   X-rays:  X-rays and CT scan images and report reviewed myself.  Patient has chronic deformity of the left femoral head with degenerative changes of the acetabulum with subchondral cysts and sclerosis.  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  There are no fractures noted in the femoral neck or intertrochanteric region.  The bone quality of the proximal femur appears good with thick cortices ongoing calcar and lateral femoral cortex.  Patient has a right total hip replacement with press-fit components which appears in good position no evidence of periprosthetic fracture or loosening.  DEXA scan reviewed from 2019 showing mild osteopenia no frank osteoporosis.  Overall  Assessment: Left femoral head AVN/femoral head fracture  Plan: Barbara Hernandez is a 82 year old female who presents with a displaced left femoral head fracture in the setting of chronic degeneration and what appears to be AVN on CT scan.  There is no evidence of any effusion or ongoing inflammatory process with a negative ESR and CRP.  This reassures me that the there is rapid progression of hip deformity and collapse is secondary to an avascular process versus an infectious process.  I reviewed the imaging and plan with the patient.  She was previously scheduled to undergo a left total replacement a few months for this known degeneration in her hip.  As the femoral head has now acutely failed we discussed the option of moving forward with her total hip replacement at this time.  We reviewed alternative options including the option of nonoperative management patient would like to proceed with surgery at this time.  We discussed approaches and options for the surgery and after an extensive conversation about the risks and benefits we have elected move forward with an anterior left total hip replacement.  A long discussion took place with the patient describing what a anterior total hip replacement is and what the procedure would entail. The xrays were reviewed with the patient and the implants were discussed. The ability to secure the implant utilizing screws/cement/ or cementless (press fit) fixation was discussed. Surgical exposures were  discussed with the patient.    The hospitalization and post-operative care and rehabilitation were also discussed. The use of perioperative antibiotics and DVT prophylaxis were discussed. The risk, benefits and alternatives to a surgical intervention were discussed at length with the patient. The patient was also advised of risks related to the medical comorbidities. A lengthy discussion took place to review the most common complications including but not limited to: deep vein thrombosis, pulmonary embolus, heart attack, stroke, infection, wound breakdown, dislocation, numbness, leg length in-equality, damage to nerves, intraoperative fracture, tendon,muscles, arteries or other blood vessels, death and other possible complications from anesthesia. The patient was told that we will take steps to minimize these risks by using sterile technique, antibiotics and DVT prophylaxis when appropriate and follow the patient postoperatively in the office setting to monitor progress. The possibility of recurrent pain, no improvement in pain and actual worsening of pain were also discussed with the patient. The risk of dislocation following surgery was discussed and potential precautions to prevent dislocation were reviewed.       The benefits of surgery were discussed with the patient including the potential for improving the patient's current clinical condition through operative intervention. Alternatives to surgical intervention including conservative management were also discussed in detail. All questions were answered to the satisfaction of the patient. The patient participated and agreed to the plan of care as well as the use of the recommended implants for their surgery.    Plan for surgery today 12/22/2022 in the afternoon hopefully around 3 PM N.p.o. for the operating room Hold anticoagulation for the OR Will obtain consent in preoperative holding just  before surgery Medical clearance/optimization for the  OR   Reinaldo Berber MD  Beeper #:  [phone]  12/22/2022 8:21 AM

## 2022-12-23 DIAGNOSIS — S72002A Fracture of unspecified part of neck of left femur, initial encounter for closed fracture: Secondary | ICD-10-CM | POA: Diagnosis not present

## 2022-12-23 LAB — BASIC METABOLIC PANEL
Anion gap: 9 (ref 5–15)
BUN: 19 mg/dL (ref 8–23)
CO2: 21 mmol/L — ABNORMAL LOW (ref 22–32)
Calcium: 9 mg/dL (ref 8.9–10.3)
Chloride: 100 mmol/L (ref 98–111)
Creatinine, Ser: 0.56 mg/dL (ref 0.44–1.00)
GFR, Estimated: >60 mL/min (ref >60)
Glucose, Bld: 142 mg/dL — ABNORMAL HIGH (ref 70–99)
Potassium: 4.4 mmol/L (ref 3.5–5.1)
Sodium: 130 mmol/L — ABNORMAL LOW (ref 135–145)

## 2022-12-23 LAB — CBC
HCT: 39.2 % (ref 36.0–46.0)
Hemoglobin: 12.4 g/dL (ref 12.0–15.0)
MCH: 27.3 pg (ref 26.0–34.0)
MCHC: 31.6 g/dL (ref 30.0–36.0)
MCV: 86.3 fL (ref 80.0–100.0)
Platelets: 240 10*3/uL (ref 150–400)
RBC: 4.54 MIL/uL (ref 3.87–5.11)
RDW: 15.9 % — ABNORMAL HIGH (ref 11.5–15.5)
WBC: 12.6 10*3/uL — ABNORMAL HIGH (ref 4.0–10.5)
nRBC: 0 % (ref 0.0–0.2)

## 2022-12-23 MED ORDER — ONDANSETRON HCL 4 MG PO TABS: 4.0000 mg | ORAL_TABLET | Freq: Four times a day (QID) | ORAL | 0 refills | Status: AC | PRN

## 2022-12-23 MED ORDER — ACETAMINOPHEN 500 MG PO TABS: 1000.0000 mg | ORAL_TABLET | Freq: Three times a day (TID) | ORAL | 0 refills | Status: AC

## 2022-12-23 MED ORDER — ENOXAPARIN SODIUM 40 MG/0.4ML IJ SOSY: 40.0000 mg | PREFILLED_SYRINGE | INTRAMUSCULAR | 0 refills | Status: AC

## 2022-12-23 MED ORDER — DOCUSATE SODIUM 100 MG PO CAPS: 100.0000 mg | ORAL_CAPSULE | Freq: Two times a day (BID) | ORAL | 0 refills | Status: AC

## 2022-12-23 MED ORDER — CALCIUM CARBONATE ANTACID 500 MG PO CHEW
400.0000 mg | CHEWABLE_TABLET | Freq: Three times a day (TID) | ORAL | Status: DC | PRN
  Administered 2022-12-23: 400 mg via ORAL
  Filled 2022-12-23 (×2): qty 2

## 2022-12-23 MED ORDER — CELECOXIB 200 MG PO CAPS: 200.0000 mg | ORAL_CAPSULE | Freq: Two times a day (BID) | ORAL | 0 refills | Status: AC

## 2022-12-23 MED ORDER — OXYCODONE HCL 5 MG PO TABS: 2.5000 mg | ORAL_TABLET | Freq: Four times a day (QID) | ORAL | 0 refills | Status: AC | PRN

## 2022-12-23 MED ORDER — TRAMADOL HCL 50 MG PO TABS: 50.0000 mg | ORAL_TABLET | Freq: Four times a day (QID) | ORAL | 0 refills | Status: AC | PRN

## 2022-12-23 NOTE — Evaluation (Signed)
Occupational Therapy Evaluation Patient Details Name: Barbara Hernandez MRN: [id number] DOB: Feb 25, 1941 Today's Date: 12/23/2022   History of Present Illness 82 y.o female admitted for L hip fx due to AVN. L THA completed on 7/3. pmh of COPD, sleep apnea, CPAP, Gerd, HTN, CAD, hypothyroidism, blood dyscrasia, anemia   Clinical Impression   Pt seen for OT evaluation this date and reports she may be able to go home today.  Daughter present during evaluation.  Pt lives at home alone, in a one story home with a walker, elevated toilet, reacher, sock aid and long shoehorn.  She has bilateral shoulder arthritis which can flare at times.  She has all the necessary equipment she needs to return home, her daughter will be able to help her for the next couple weeks.  She is familiar with adaptive equipment such as a reacher, sock aid and shoe horn from a previous surgery.  She was seen for evaluation with treatment to address lower body dressing this date and do not anticipate further OT needs at this time.         Recommendations for follow up therapy are one component of a multi-disciplinary discharge planning process, led by the attending physician.  Recommendations may be updated based on patient status, additional functional criteria and insurance authorization.   Assistance Recommended at Discharge Intermittent Supervision/Assistance  Patient can return home with the following Assistance with cooking/housework;Assist for transportation;A little help with bathing/dressing/bathroom    Functional Status Assessment  Patient has had a recent decline in their functional status and demonstrates the ability to make significant improvements in function in a reasonable and predictable amount of time.  Equipment Recommendations       Recommendations for Other Services       Precautions / Restrictions Precautions Precautions: Fall Restrictions Weight Bearing Restrictions: Yes LLE Weight Bearing: Weight  bearing as tolerated      Mobility Bed Mobility Overal bed mobility: Modified Independent                  Transfers Overall transfer level: Needs assistance Equipment used: Rolling walker (2 wheels) Transfers: Sit to/from Stand Sit to Stand: Modified independent (Device/Increase time)                  Balance   Sitting-balance support: Feet supported, No upper extremity supported Sitting balance-Leahy Scale: Normal     Standing balance support: During functional activity, Bilateral upper extremity supported Standing balance-Leahy Scale: Normal Standing balance comment: No AD needed to maintain balance and weight shifts.                           ADL either performed or assessed with clinical judgement   ADL Overall ADL's : Needs assistance/impaired Eating/Feeding: Modified independent   Grooming: Modified independent   Upper Body Bathing: Modified independent   Lower Body Bathing: Minimal assistance   Upper Body Dressing : Modified independent   Lower Body Dressing: Minimal assistance               Functional mobility during ADLs: Modified independent       Vision Baseline Vision/History: 1 Wears glasses       Perception     Praxis      Pertinent Vitals/Pain Pain Assessment Pain Assessment: No/denies pain     Hand Dominance Left   Extremity/Trunk Assessment Upper Extremity Assessment Upper Extremity Assessment: Overall WFL for tasks assessed   Lower Extremity Assessment  Lower Extremity Assessment: Defer to PT evaluation       Communication Communication Communication: No difficulties   Cognition Arousal/Alertness: Awake/alert Behavior During Therapy: WFL for tasks assessed/performed Overall Cognitive Status: Within Functional Limits for tasks assessed                                       General Comments       Exercises     Shoulder Instructions      Home Living Family/patient expects  to be discharged to:: Private residence Living Arrangements: Alone Available Help at Discharge: Family;Available 24 hours/day Type of Home: House Home Access: Stairs to enter [employer] of Steps: 3 Entrance Stairs-Rails: Right Home Layout: One level     Bathroom Shower/Tub: Producer, television/film/video: Handicapped height Bathroom Accessibility: Yes   Home Equipment: Agricultural consultant (2 wheels);Adaptive equipment Adaptive Equipment: Reacher;Sock aid;Long-handled shoe horn        Prior Functioning/Environment Prior Level of Function : Independent/Modified Independent             Mobility Comments: Pt ambulates with no AD regularly. Used RW past 2 weeks before surgery to help with pain. ADLs Comments: Independent        OT Problem List: Decreased strength;Decreased knowledge of use of DME or AE;Decreased range of motion      OT Treatment/Interventions: Self-care/ADL training;DME and/or AE instruction;Patient/family education    OT Goals(Current goals can be found in the care plan section) Acute Rehab OT Goals Patient Stated Goal: to go home and take care of myself OT Goal Formulation: With patient/family Time For Goal Achievement: 01/01/23 Potential to Achieve Goals: Good ADL Goals Pt Will Perform Lower Body Dressing: with modified independence Pt Will Transfer to Toilet: with modified independence  OT Frequency: Min 1X/week    Co-evaluation              AM-PAC OT "6 Clicks" Daily Activity     Outcome Measure Help from another person eating meals?: None Help from another person taking care of personal grooming?: None Help from another person toileting, which includes using toliet, bedpan, or urinal?: None Help from another person bathing (including washing, rinsing, drying)?: A Little Help from another person to put on and taking off regular upper body clothing?: None Help from another person to put on and taking off regular lower body  clothing?: A Little 6 Click Score: 22   End of Session Equipment Utilized During Treatment: Gait belt;Rolling walker (2 wheels)  Activity Tolerance: Patient tolerated treatment well Patient left: in bed;with nursing/sitter in room;with call bell/phone within reach;with family/visitor present  OT Visit Diagnosis: Muscle weakness (generalized) (M62.81)                Time: 1610-9604 OT Time Calculation (min): 25 min Charges:  OT General Charges $OT Visit: 1 Visit OT Evaluation $OT Eval Low Complexity: 1 Low OT Treatments $Self Care/Home Management : 8-22 mins Ryot Burrous T Kingdom Vanzanten, OTR/L, CLT  Kelbi Renstrom 12/23/2022, 3:35 PM

## 2022-12-23 NOTE — Anesthesia Postprocedure Evaluation (Signed)
Anesthesia Post Note  Patient: Barbara Hernandez  Procedure(s) Performed: TOTAL HIP ARTHROPLASTY ANTERIOR APPROACH (Left: Hip)  Patient location during evaluation: Nursing Unit Anesthesia Type: Spinal Level of consciousness: oriented and awake and alert Pain management: pain level controlled Vital Signs Assessment: post-procedure vital signs reviewed and stable Respiratory status: spontaneous breathing and respiratory function stable Cardiovascular status: blood pressure returned to baseline and stable Postop Assessment: no headache, no backache, no apparent nausea or vomiting and patient able to bend at knees Anesthetic complications: no   No notable events documented.   Last Vitals:  Vitals:   12/23/22 0448 12/23/22 0743  BP: (!) 158/76 (!) 141/71  Pulse: 91 77  Resp: 20   Temp: 36.4 C   SpO2: 96%     Last Pain:  Vitals:   12/23/22 0448  TempSrc: Oral  PainSc:                  Foye Deer

## 2022-12-23 NOTE — Discharge Summary (Signed)
Physician Discharge Summary   Patient: Barbara Hernandez MRN: [id number] DOB: January 24, 1941  Admit date:     12/21/2022  Discharge date: 12/23/22  Discharge Physician: Lurene Shadow   PCP: Rayetta Humphrey, MD   Recommendations at discharge:   Follow-up with orthopedic surgeon in 2 weeks  Discharge Diagnoses: Principal Problem:   Hip fracture (HCC) Active Problems:   Unspecified fracture of head of left femur, initial encounter for closed fracture (HCC)   Avascular necrosis of bone of left hip (HCC)  Resolved Problems:   * No resolved hospital problems. *  Hospital Course:  Barbara Hernandez is a 82 y.o. 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 Dion Saucier with plan for left total hip arthroplasty scheduled in October 2024.  She presented to the hospital because of severe left hip pain and difficulty ambulating.  She did not have any trauma or fall.   She was admitted to the hospital for left hip avascular necrosis with femoral head fracture.  She was evaluated by Dr. Audelia Acton, orthopedic surgeon.  She underwent left total hip arthroplasty.  He was evaluated by PT and OT postoperatively.  PT recommended home health therapy.  Her condition has improved and she wants to go home today.  She is deemed stable for discharge.  She is okay for discharge from orthopedic surgeon's standpoint.          Consultants: Orthopedic surgeon Procedures performed: Left total hip arthroplasty Disposition: Home health Diet recommendation:  Discharge Diet Orders (From admission, onward)     Start     Ordered   12/23/22 0000  Diet - low sodium heart healthy        12/23/22 1440           Cardiac diet DISCHARGE MEDICATION: Allergies as of 12/23/2022       Reactions   Carvedilol Other (See Comments)   No  energy, fatigued - higher doses   Hctz [hydrochlorothiazide] Other (See Comments)   Abnormal labs hyponatremia   Spironolactone Other (See Comments)   Hyponatremia        Medication List     TAKE these medications    acetaminophen 500 MG tablet Commonly known as: TYLENOL Take 2 tablets (1,000 mg total) by mouth every 8 (eight) hours.   AeroChamber MV inhaler Use as instructed   albuterol 108 (90 Base) MCG/ACT inhaler Commonly known as: VENTOLIN HFA Inhale 2 puffs into the lungs every 4 (four) hours as needed.   amLODipine 5 MG tablet Commonly known as: NORVASC Take by mouth.   aspirin EC 81 MG tablet Take 81 mg by mouth at bedtime.   CALCIUM 1200 PO Take 1 tablet by mouth daily.   carvedilol 3.125 MG tablet Commonly known as: COREG Take by mouth.   celecoxib 200 MG capsule Commonly known as: CeleBREX Take 1 capsule (200 mg total) by mouth 2 (two) times daily for 14 days. What changed: when to take this   cyanocobalamin 1000 MCG tablet Commonly known as: VITAMIN B12 Take 1,000 mcg by mouth daily.   diclofenac Sodium 1 % Gel Commonly known as: Voltaren Arthritis Pain Apply 2 g topically 4 (four) times daily as needed.   docusate sodium 100 MG capsule Commonly known as: COLACE Take 1 capsule (100 mg total) by mouth 2 (two) times daily.   enoxaparin 40 MG/0.4ML injection Commonly known  as: LOVENOX Inject 0.4 mLs (40 mg total) into the skin daily for 14 days. Start taking on: December 24, 2022   GENTEAL TEARS OP Apply 1 application to eye at bedtime.   Ipratropium-Albuterol 20-100 MCG/ACT Aers respimat Commonly known as: COMBIVENT Inhale 1 puff into the lungs every 6 (six) hours as needed.   lisinopril 40 MG tablet Commonly known as: ZESTRIL Take 1 tablet (40 mg total) by mouth daily. bedtime What changed: additional instructions   multivitamin with minerals Tabs tablet Take 1 tablet by mouth daily.   omeprazole 20 MG capsule Commonly known as:  PRILOSEC Take 1 capsule (20 mg total) by mouth daily.   ondansetron 4 MG tablet Commonly known as: ZOFRAN Take 1 tablet (4 mg total) by mouth every 6 (six) hours as needed for nausea.   oxyCODONE 5 MG immediate release tablet Commonly known as: Roxicodone Take 0.5-1 tablets (2.5-5 mg total) by mouth every 6 (six) hours as needed for breakthrough pain.   simvastatin 20 MG tablet Commonly known as: ZOCOR Take 1 tablet (20 mg total) by mouth daily. bedtime   Synthroid 100 MCG tablet Generic drug: levothyroxine Take 100 mcg by mouth daily before breakfast.   traMADol 50 MG tablet Commonly known as: ULTRAM Take 1 tablet (50 mg total) by mouth every 6 (six) hours as needed for moderate pain.   Vitamin D3 50 MCG (2000 UT) Tabs Take 2,000 Units by mouth every evening.        Follow-up Information     Evon Slack, PA-C Follow up in 2 week(s).   Specialties: Orthopedic Surgery, Emergency Medicine Contact information: [address] [place] [address] [phone]                Discharge Exam: Ceasar Mons Weights   12/21/22 1930 12/22/22 1418  Weight: 90.3 kg 90.3 kg   GEN: NAD SKIN: Warm and dry EYES: No pallor or icterus ENT: MMM CV: RRR PULM: CTA B ABD: soft, obese, NT, +BS CNS: AAO x 3, non focal EXT: Mild swelling of the left hip.  Dressing on left hip surgical wound is clean, dry and intact.   Condition at discharge: good  The results of significant diagnostics from this hospitalization (including imaging, microbiology, ancillary and laboratory) are listed below for reference.   Imaging Studies: DG HIP UNILAT WITH PELVIS 1V LEFT  Result Date: 12/22/2022 CLINICAL DATA:  Left hip replacement. EXAM: DG HIP (WITH OR WITHOUT PELVIS) 1V*L* COMPARISON:  Pelvic radiograph dated 12/21/2022. FINDINGS: Three intraoperative fluoroscopic spot images provided. The total fluoroscopic time is 11 seconds. The cumulative air Karma of 2.4 mGy. Left total hip  replacement as well as prior right total hip replacement noted. IMPRESSION: Left total hip replacement. Electronically Signed   By: Elgie Collard M.D.   On: 12/22/2022 17:27   DG C-Arm 1-60 Min-No Report  Result Date: 12/22/2022 Fluoroscopy was utilized by the requesting physician.  No radiographic interpretation.   CT HIP LEFT WO CONTRAST  Result Date: 12/21/2022 CLINICAL DATA:  Left hip pain EXAM: CT OF THE LEFT HIP WITHOUT CONTRAST TECHNIQUE: Multidetector CT imaging of the left hip was performed according to the standard protocol. Multiplanar CT image reconstructions were also generated. RADIATION DOSE REDUCTION: This exam was performed according to the departmental dose-optimization program which includes automated exposure control, adjustment of the mA and/or kV according to patient size and/or use of iterative reconstruction technique. COMPARISON:  Plain film from earlier in the same day. FINDINGS: Bones/Joint/Cartilage Severe degenerative  changes of the left hip joint are noted with some subchondral sclerosis and cyst formation in the acetabulum. Some remodeling of the lateral margin of the acetabulum is noted superiorly. Remodeling of the femoral head is noted as well. Additionally there is a vertical fracture line identified through the deformed femoral head with some impaction at the fracture site. No other fracture is noted. The remainder of the visualized pelvis is within normal limits. Ligaments Suboptimally assessed by CT. Muscles and Tendons Surrounding musculature appears within normal limits. Soft tissues Pelvic soft tissue structures are unremarkable. No hematoma is seen. IMPRESSION: Vertical fracture line through a chronically deformed left femoral head. The overall appearance is similar to that seen on recent plain film. Significant degenerative changes involving the acetabulum on the left. Electronically Signed   By: Alcide Clever M.D.   On: 12/21/2022 23:31   DG Chest Portable 1  View  Result Date: 12/21/2022 CLINICAL DATA:  Preop hip fracture EXAM: PORTABLE CHEST 1 VIEW COMPARISON:  12/28/2021 FINDINGS: No acute airspace disease. Stable cardiomediastinal silhouette with aortic atherosclerosis. No pneumothorax IMPRESSION: No active disease. Electronically Signed   By: Jasmine Pang M.D.   On: 12/21/2022 22:52   DG Hip Unilat W or Wo Pelvis 2-3 Views Left  Result Date: 12/21/2022 CLINICAL DATA:  Left hip pain. EXAM: DG HIP (WITH OR WITHOUT PELVIS) 2-3V LEFT COMPARISON:  Radiograph dated 01/12/2022. FINDINGS: There is a minimally displaced fracture of the left femoral head/neck junction. There is mild superior migration of the femoral shaft and impaction on the acetabular roof. 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 No dislocation. Severe arthritic changes of the left hip with near complete loss of joint space. The bones are osteopenic. Total right hip arthroplasty. The soft tissues are unremarkable. IMPRESSION: 1. Minimally displaced fracture of the left femoral head/neck junction. 2. 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 Electronically Signed   By: Elgie Collard M.D.   On: 12/21/2022 21:47    Microbiology: Results for orders placed or performed during the hospital encounter of 12/21/22  Surgical pcr screen     Status: None   Collection Time: 12/22/22  8:24 AM   Specimen: Nasal Mucosa; Nasal Swab  Result Value Ref Range Status   MRSA, PCR NEGATIVE NEGATIVE Final   Staphylococcus aureus NEGATIVE NEGATIVE Final    Comment: (NOTE) The Xpert SA Assay (FDA approved for NASAL specimens in patients 37 years of age and older), is one component of a comprehensive surveillance program. It is not intended to diagnose infection nor to guide or monitor treatment. Performed at [facility], [address] [place], [place] [address]     Labs: CBC: Recent Labs  Lab  12/21/22 2233 12/22/22 0420 12/23/22 0429  WBC 9.0 6.3 12.6*  NEUTROABS 7.4  --   --   HGB 13.3 12.7 12.4  HCT 42.7 40.3 39.2  MCV 87.9 88.0 86.3  PLT 252 246 240   Basic Metabolic Panel: Recent Labs  Lab 12/21/22 2233 12/22/22 0420 12/23/22 0429  NA 131* 136 130*  K 4.1 3.7 4.4  CL 100 104 100  CO2 23 23 21*  GLUCOSE 112* 111* 142*  BUN 23 16 19   CREATININE 0.54 0.51 0.56  CALCIUM 9.3 8.9 9.0   Liver Function Tests: No results for input(s): "AST", "ALT", "ALKPHOS", "BILITOT", "PROT", "ALBUMIN" in the last 168 hours. CBG: No results for input(s): "GLUCAP" in the  last 168 hours.  Discharge time spent: greater than 30 minutes.  Signed: Lurene Shadow, MD [facility] 12/23/2022

## 2022-12-23 NOTE — Evaluation (Signed)
Physical Therapy Evaluation Patient Details Name: Barbara Hernandez MRN: [id number] DOB: 12-07-1940 Today's Date: 12/23/2022  History of Present Illness  82 y.o female admitted for L hip fx due to AVN. L THA completed on 7/3. pmh of COPD, sleep apnea, CPAP, Gerd, HTN, CAD, hypothyroidism, blood dyscrasia, anemia  Clinical Impression  Pt is a pleasant 82 year old female who was admitted for L hip fx and received a L THA on 7/3. Pt performs transfers, and ambulation with mod I. Sit<> Stand w/ RW and w/out independently. Ambulated 300 ft with RW and step through pattern. Navigated 4 steps forward in step to pattern with R rail. Returned to chair with call bell in reach and daughter in room. Discussed the importance of using AD to move around and continuing to call nursing for assistance to the bathroom Pt demonstrates deficits with balance, mobility, strength, ROM and safety awareness. Pt will continue to receive skilled PT services while admitted and will defer to TOC/care team for updates regarding disposition planning.       Assistance Recommended at Discharge Intermittent Supervision/Assistance  If plan is discharge home, recommend the following:  Can travel by private vehicle  A little help with bathing/dressing/bathroom;Assistance with cooking/housework;Assist for transportation        Equipment Recommendations None recommended by PT  Recommendations for Other Services       Functional Status Assessment Patient has had a recent decline in their functional status and demonstrates the ability to make significant improvements in function in a reasonable and predictable amount of time.     Precautions / Restrictions Precautions Precautions: Fall Restrictions Weight Bearing Restrictions: Yes LLE Weight Bearing: Weight bearing as tolerated      Mobility  Bed Mobility               General bed mobility comments: Unable to assess, Pt sittiing in chair at stand and end of  session    Transfers Overall transfer level: Needs assistance Equipment used: Rolling walker (2 wheels) Transfers: Sit to/from Stand Sit to Stand: Modified independent (Device/Increase time)           General transfer comment: sit <> stand with and w/out RW mod I    Ambulation/Gait Ambulation/Gait assistance: Modified independent (Device/Increase time) Gait Distance (Feet): 300 Feet Assistive device: Rolling walker (2 wheels) Gait Pattern/deviations: Step-through pattern, WFL(Within Functional Limits)       General Gait Details: step through gait pattern with R hip hike during gait.  Stairs Stairs: Yes Stairs assistance: Modified independent (Device/Increase time) Stair Management: One rail Right, Forwards, Step to pattern Number of Stairs: 4    Wheelchair Mobility     Tilt Bed    Modified Rankin (Stroke Patients Only)       Balance Overall balance assessment: Needs assistance Sitting-balance support: Feet supported, No upper extremity supported Sitting balance-Leahy Scale: Normal     Standing balance support: During functional activity, Bilateral upper extremity supported Standing balance-Leahy Scale: Normal Standing balance comment: No AD needed to maintain balance and weight shifts.                             Pertinent Vitals/Pain Pain Assessment Pain Assessment: No/denies pain    Home Living Family/patient expects to be discharged to:: Private residence Living Arrangements: Alone Available Help at Discharge: Family;Available 24 hours/day Type of Home: House Home Access: Stairs to enter Entrance Stairs-Rails: Right Entrance Stairs-Number of Steps: 3   Home  Layout: One level Home Equipment: Agricultural consultant (2 wheels)      Prior Function Prior Level of Function : Independent/Modified Independent             Mobility Comments: Pt ambulates with no AD regularly. Used RW past 2 weeks before surgery to help with pain. ADLs  Comments: IND     Hand Dominance        Extremity/Trunk Assessment   Upper Extremity Assessment Upper Extremity Assessment: Overall WFL for tasks assessed    Lower Extremity Assessment Lower Extremity Assessment: Overall WFL for tasks assessed       Communication   Communication: No difficulties  Cognition Arousal/Alertness: Awake/alert Behavior During Therapy: WFL for tasks assessed/performed Overall Cognitive Status: Within Functional Limits for tasks assessed                                          General Comments      Exercises     Assessment/Plan    PT Assessment Patient needs continued PT services  PT Problem List Decreased balance;Decreased mobility;Decreased safety awareness;Pain       PT Treatment Interventions DME instruction;Gait training;Stair training;Functional mobility training;Therapeutic activities;Therapeutic exercise;Balance training;Patient/family education    PT Goals (Current goals can be found in the Care Plan section)  Acute Rehab PT Goals Patient Stated Goal: to go home PT Goal Formulation: With patient/family Time For Goal Achievement: 01/06/23 Potential to Achieve Goals: Good    Frequency BID     Co-evaluation               AM-PAC PT "6 Clicks" Mobility  Outcome Measure Help needed turning from your back to your side while in a flat bed without using bedrails?: None Help needed moving from lying on your back to sitting on the side of a flat bed without using bedrails?: None Help needed moving to and from a bed to a chair (including a wheelchair)?: None Help needed standing up from a chair using your arms (e.g., wheelchair or bedside chair)?: None Help needed to walk in hospital room?: None Help needed climbing 3-5 steps with a railing? : None 6 Click Score: 24    End of Session Equipment Utilized During Treatment: Gait belt Activity Tolerance: Patient tolerated treatment well Patient left: in  chair;with call bell/phone within reach Nurse Communication: Mobility status PT Visit Diagnosis: Pain;Difficulty in walking, not elsewhere classified (R26.2) Pain - Right/Left: Left Pain - part of body: Hip    Time: 1030-1053 PT Time Calculation (min) (ACUTE ONLY): 23 min   Charges:                 Malachi Carl, SPT   Malachi Carl 12/23/2022, 12:43 PM

## 2022-12-23 NOTE — Progress Notes (Addendum)
Subjective: 1 Day Post-Op Procedure(s) (LRB): TOTAL HIP ARTHROPLASTY ANTERIOR APPROACH (Left) Patient reports pain as mild.   Patient is well, and has had no acute complaints or problems Denies any CP, SOB, ABD pain.  No dizziness or lightheadedness.  No nausea or vomiting. Patient safely completed all physical therapy goals.  Was able to complete lap around the nurses station as well as stairs with no complications.  She is very pleased with mobility. Patient with family at bedside, patient and family both agree that she is safe and stable and ready for discharge to home today, requesting discharge home.  Patient has all necessary DME at home   Objective: Vital signs in last 24 hours: Temp:  [97.3 F (36.3 C)-98.7 F (37.1 C)] 98.7 F (37.1 C) (07/04 1121) Pulse Rate:  [56-91] 59 (07/04 1121) Resp:  [12-20] 16 (07/04 1121) BP: (83-158)/(48-76) 83/48 (07/04 1121) SpO2:  [93 %-99 %] 99 % (07/04 1121) FiO2 (%):  [21 %] 21 % (07/03 2117) Weight:  [90.3 kg] 90.3 kg (07/03 1418)  Intake/Output from previous day: 07/03 0701 - 07/04 0700 In: 1882.7 [I.V.:1432.7; IV Piggyback:450] Out: 1200 [Urine:1050; Blood:150] Intake/Output this shift: No intake/output data recorded.  Recent Labs    12/21/22 2233 12/22/22 0420 12/23/22 0429  HGB 13.3 12.7 12.4   Recent Labs    12/22/22 0420 12/23/22 0429  WBC 6.3 12.6*  RBC 4.58 4.54  HCT 40.3 39.2  PLT 246 240   Recent Labs    12/22/22 0420 12/23/22 0429  NA 136 130*  K 3.7 4.4  CL 104 100  CO2 23 21*  BUN 16 19  CREATININE 0.51 0.56  GLUCOSE 111* 142*  CALCIUM 8.9 9.0   Recent Labs    12/21/22 2225  INR 1.0    EXAM General - Patient is Alert, Appropriate, and Oriented Extremity - Neurovascular intact Sensation intact distally Intact pulses distally Dorsiflexion/Plantar flexion intact Dressing - dressing C/D/I and no drainage Motor Function - intact, moving foot and toes well on exam.   Past Medical History:   Diagnosis Date   Anemia    Arthritis    Barrett's esophagus 2012   2010 upper endoscopy showed only reflux. 2012 biopsies suggested Barrett's epithelial changes.   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 Gwen Pounds   Family history of adverse reaction to anesthesia    sister had memory problems after delivery of child   GERD (gastroesophageal reflux disease)    H/O measles    Heart murmur    Hematuria    Hematuria    Hemorrhoid    Hemorrhoids    History of chicken pox    Hyperlipidemia    Hypertension    Hypothyroidism    Joint pain    Low sodium levels    Mild left ventricular hypertrophy    Onychomycosis    Perforation of sigmoid colon due to diverticulitis 09/26/2017   Primary localized osteoarthritis of left knee 09/06/2017   Reflux    Reflux esophagitis    S/P total knee replacement, 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   left knee replacement 09/2017   Sleep apnea 2011   Uses C-Pap machine   Thyroid disease    Wears dentures    full upper and lower    Assessment/Plan:  1 Day Post-Op Procedure(s) (LRB): TOTAL HIP ARTHROPLASTY ANTERIOR APPROACH (Left) Principal Problem:   Hip fracture (HCC) Active Problems:   Unspecified fracture of head of left femur, initial encounter for closed fracture (HCC)   Avascular necrosis of bone of left hip (HCC)  Estimated body mass index is 36.4 kg/m as calculated from the following:   Height as of this encounter: 5\' 2"  (1.575 m).   Weight as of this encounter: 90.3 kg. Advance diet Up with therapy Pain well-controlled  Labs are stable  Vital signs are stable, BP soft.  Continue with gentle IV fluid hydration.  Patient tolerated physical therapy with stable vital signs no complaints of dizziness or lightheadedness  Care  management to assist with discharge to home with home health PT today pending improved BP  Follow-up with [facility] orthopedics in 2 weeks.  All discharge medications have been electronically prescribed.  DVT Prophylaxis - Lovenox, TED hose, and SCDs Weight-Bearing as tolerated to left leg   T. Cranston Neighbor, PA-C [facility] 12/23/2022, 11:48 AM  Patient seen and examined, agree with above plan.  The patient is doing well status post left anterior total hip arthroplasty, no concerns at this time.  Pain is controlled. No dizziness or lightheadedness at this time.  Discussed DVT prophylaxis, pain medication use, and safe transition to home.  All questions answered the patient agrees with above plan will go home hopefully today as she has already cleared PT.   Reinaldo Berber MD

## 2022-12-23 NOTE — Progress Notes (Signed)
Reviewed discharge instructions with patient. Voiced understanding. Daughter at bedside during review. Patient discharged with compression stockings on. Sent home with incentive spirometer and extra honeycomb dressing. Staff wheeled patient out, where daughter transported her home via family vehicle.

## 2022-12-23 NOTE — Plan of Care (Signed)

## 2022-12-23 NOTE — Discharge Instructions (Addendum)
Instructions after Anterior Total Hip Replacement        Dr. Zachary Aberman, Jr., M.D.      Dept. of Orthopaedics & Sports Medicine  [facility]  [address]  [place],   [address]  Phone: [phone]   Fax: [phone]    DIET: Drink plenty of non-alcoholic fluids. Resume your normal diet. Include foods high in fiber.  ACTIVITY:  You may use crutches or a walker with weight-bearing as tolerated, unless instructed otherwise. You may be weaned off of the walker or crutches by your Physical Therapist.  Continue doing gentle exercises. Exercising will reduce the pain and swelling, increase motion, and prevent muscle weakness.   Please continue to use the TED compression stockings for 2 weeks. You may remove the stockings at night, but should reapply them in the morning. Do not drive or operate any equipment until instructed.  WOUND CARE:  Continue to use ice packs periodically to reduce pain and swelling. You may shower with honeycomb dressing 3 days after your surgery. Do not submerge incision site under water. Remove honeycomb dressing 7 days after surgery and allow dermabond to fall off on its own.   MEDICATIONS: You may resume your regular medications. Please take the pain medication as prescribed on the medication list. Do not take pain medication on an empty stomach. You have been given a prescription for a blood thinner to prevent blood clots. Please take the medication as instructed. (NOTE: After completing a 2 week course of Lovenox, take one Enteric-coated 81 mg aspirin twice a day for 3 additional weeks.) Pain medications and iron supplements can cause constipation. Use a stool softener (Senokot or Colace) on a daily basis and a laxative (dulcolax or miralax) as needed. Do not drive or drink alcoholic beverages when taking pain medications.  POSTOPERATIVE CONSTIPATION PROTOCOL Constipation - defined medically as fewer than three stools per week and  severe constipation as less than one stool per week.  One of the most common issues patients have following surgery is constipation.  Even if you have a regular bowel pattern at home, your normal regimen is likely to be disrupted due to multiple reasons following surgery.  Combination of anesthesia, postoperative narcotics, change in appetite and fluid intake all can affect your bowels.  In order to avoid complications following surgery, here are some recommendations in order to help you during your recovery period.  Colace (docusate) - Pick up an over-the-counter form of Colace or another stool softener and take twice a day as long as you are requiring postoperative pain medications.  Take with a full glass of water daily.  If you experience loose stools or diarrhea, hold the colace until you stool forms back up.  If your symptoms do not get better within 1 week or if they get worse, check with your doctor.  Dulcolax (bisacodyl) - Pick up over-the-counter and take as directed by the product packaging as needed to assist with the movement of your bowels.  Take with a full glass of water.  Use this product as needed if not relieved by Colace only.   MiraLax (polyethylene glycol) - Pick up over-the-counter to have on hand.  MiraLax is a solution that will increase the amount of water in your bowels to assist with bowel movements.  Take as directed and can mix with a glass of water, juice, soda, coffee, or tea.  Take if you go more than two days without a movement. Do not use MiraLax more than   once per day. Call your doctor if you are still constipated or irregular after using this medication for 7 days in a row.  If you continue to have problems with postoperative constipation, please contact the office for further assistance and recommendations.  If you experience "the worst abdominal pain ever" or develop nausea or vomiting, please contact the office immediatly for further recommendations for  treatment.   CALL THE OFFICE FOR: Temperature above 101 degrees Excessive bleeding or drainage on the dressing. Excessive swelling, coldness, or paleness of the toes. Persistent nausea and vomiting.  FOLLOW-UP:  You should have an appointment to return to the office in 2 weeks after surgery. Arrangements have been made for continuation of Physical Therapy (either home therapy or outpatient therapy).    [facility] for Home Physical Therapy They will call you to set up, if You do not hear from them call Bjorn Loser with [facility] at [phone]

## 2022-12-24 ENCOUNTER — Encounter: Payer: Self-pay | Admitting: Orthopedic Surgery

## 2022-12-25 DIAGNOSIS — S72052A Unspecified fracture of head of left femur, initial encounter for closed fracture: Secondary | ICD-10-CM | POA: Diagnosis not present

## 2022-12-25 DIAGNOSIS — K219 Gastro-esophageal reflux disease without esophagitis: Secondary | ICD-10-CM | POA: Diagnosis not present

## 2022-12-25 DIAGNOSIS — E785 Hyperlipidemia, unspecified: Secondary | ICD-10-CM | POA: Diagnosis not present

## 2022-12-25 DIAGNOSIS — E039 Hypothyroidism, unspecified: Secondary | ICD-10-CM | POA: Diagnosis not present

## 2022-12-25 DIAGNOSIS — J449 Chronic obstructive pulmonary disease, unspecified: Secondary | ICD-10-CM | POA: Diagnosis not present

## 2022-12-25 DIAGNOSIS — Z7982 Long term (current) use of aspirin: Secondary | ICD-10-CM | POA: Diagnosis not present

## 2022-12-25 DIAGNOSIS — M87052 Idiopathic aseptic necrosis of left femur: Secondary | ICD-10-CM | POA: Diagnosis not present

## 2022-12-25 DIAGNOSIS — I10 Essential (primary) hypertension: Secondary | ICD-10-CM | POA: Diagnosis not present

## 2022-12-25 DIAGNOSIS — Z79891 Long term (current) use of opiate analgesic: Secondary | ICD-10-CM | POA: Diagnosis not present

## 2022-12-28 DIAGNOSIS — I10 Essential (primary) hypertension: Secondary | ICD-10-CM | POA: Diagnosis not present

## 2022-12-28 DIAGNOSIS — E785 Hyperlipidemia, unspecified: Secondary | ICD-10-CM | POA: Diagnosis not present

## 2022-12-28 DIAGNOSIS — J449 Chronic obstructive pulmonary disease, unspecified: Secondary | ICD-10-CM | POA: Diagnosis not present

## 2022-12-28 DIAGNOSIS — E039 Hypothyroidism, unspecified: Secondary | ICD-10-CM | POA: Diagnosis not present

## 2022-12-28 DIAGNOSIS — S72052A Unspecified fracture of head of left femur, initial encounter for closed fracture: Secondary | ICD-10-CM | POA: Diagnosis not present

## 2022-12-28 DIAGNOSIS — Z7982 Long term (current) use of aspirin: Secondary | ICD-10-CM | POA: Diagnosis not present

## 2022-12-28 DIAGNOSIS — Z79891 Long term (current) use of opiate analgesic: Secondary | ICD-10-CM | POA: Diagnosis not present

## 2022-12-28 DIAGNOSIS — R768 Other specified abnormal immunological findings in serum: Secondary | ICD-10-CM | POA: Diagnosis not present

## 2022-12-28 DIAGNOSIS — K219 Gastro-esophageal reflux disease without esophagitis: Secondary | ICD-10-CM | POA: Diagnosis not present

## 2022-12-28 DIAGNOSIS — M159 Polyosteoarthritis, unspecified: Secondary | ICD-10-CM | POA: Diagnosis not present

## 2022-12-28 DIAGNOSIS — I7300 Raynaud's syndrome without gangrene: Secondary | ICD-10-CM | POA: Diagnosis not present

## 2022-12-28 DIAGNOSIS — M87052 Idiopathic aseptic necrosis of left femur: Secondary | ICD-10-CM | POA: Diagnosis not present

## 2022-12-29 DIAGNOSIS — R768 Other specified abnormal immunological findings in serum: Secondary | ICD-10-CM | POA: Diagnosis not present

## 2022-12-29 DIAGNOSIS — K219 Gastro-esophageal reflux disease without esophagitis: Secondary | ICD-10-CM | POA: Diagnosis not present

## 2022-12-29 DIAGNOSIS — I10 Essential (primary) hypertension: Secondary | ICD-10-CM | POA: Diagnosis not present

## 2022-12-29 DIAGNOSIS — J449 Chronic obstructive pulmonary disease, unspecified: Secondary | ICD-10-CM | POA: Diagnosis not present

## 2022-12-29 DIAGNOSIS — S72052A Unspecified fracture of head of left femur, initial encounter for closed fracture: Secondary | ICD-10-CM | POA: Diagnosis not present

## 2022-12-29 DIAGNOSIS — M87052 Idiopathic aseptic necrosis of left femur: Secondary | ICD-10-CM | POA: Diagnosis not present

## 2022-12-29 DIAGNOSIS — M159 Polyosteoarthritis, unspecified: Secondary | ICD-10-CM | POA: Diagnosis not present

## 2022-12-29 DIAGNOSIS — Z7982 Long term (current) use of aspirin: Secondary | ICD-10-CM | POA: Diagnosis not present

## 2022-12-29 DIAGNOSIS — E785 Hyperlipidemia, unspecified: Secondary | ICD-10-CM | POA: Diagnosis not present

## 2022-12-29 DIAGNOSIS — I7300 Raynaud's syndrome without gangrene: Secondary | ICD-10-CM | POA: Diagnosis not present

## 2022-12-29 DIAGNOSIS — E039 Hypothyroidism, unspecified: Secondary | ICD-10-CM | POA: Diagnosis not present

## 2022-12-29 DIAGNOSIS — Z79891 Long term (current) use of opiate analgesic: Secondary | ICD-10-CM | POA: Diagnosis not present

## 2022-12-31 DIAGNOSIS — I10 Essential (primary) hypertension: Secondary | ICD-10-CM | POA: Diagnosis not present

## 2022-12-31 DIAGNOSIS — S72052A Unspecified fracture of head of left femur, initial encounter for closed fracture: Secondary | ICD-10-CM | POA: Diagnosis not present

## 2022-12-31 DIAGNOSIS — E039 Hypothyroidism, unspecified: Secondary | ICD-10-CM | POA: Diagnosis not present

## 2022-12-31 DIAGNOSIS — Z79891 Long term (current) use of opiate analgesic: Secondary | ICD-10-CM | POA: Diagnosis not present

## 2022-12-31 DIAGNOSIS — Z7982 Long term (current) use of aspirin: Secondary | ICD-10-CM | POA: Diagnosis not present

## 2022-12-31 DIAGNOSIS — K219 Gastro-esophageal reflux disease without esophagitis: Secondary | ICD-10-CM | POA: Diagnosis not present

## 2022-12-31 DIAGNOSIS — J449 Chronic obstructive pulmonary disease, unspecified: Secondary | ICD-10-CM | POA: Diagnosis not present

## 2022-12-31 DIAGNOSIS — E785 Hyperlipidemia, unspecified: Secondary | ICD-10-CM | POA: Diagnosis not present

## 2022-12-31 DIAGNOSIS — M87052 Idiopathic aseptic necrosis of left femur: Secondary | ICD-10-CM | POA: Diagnosis not present

## 2023-01-04 DIAGNOSIS — E039 Hypothyroidism, unspecified: Secondary | ICD-10-CM | POA: Diagnosis not present

## 2023-01-04 DIAGNOSIS — S72052A Unspecified fracture of head of left femur, initial encounter for closed fracture: Secondary | ICD-10-CM | POA: Diagnosis not present

## 2023-01-04 DIAGNOSIS — J449 Chronic obstructive pulmonary disease, unspecified: Secondary | ICD-10-CM | POA: Diagnosis not present

## 2023-01-04 DIAGNOSIS — Z79891 Long term (current) use of opiate analgesic: Secondary | ICD-10-CM | POA: Diagnosis not present

## 2023-01-04 DIAGNOSIS — K219 Gastro-esophageal reflux disease without esophagitis: Secondary | ICD-10-CM | POA: Diagnosis not present

## 2023-01-04 DIAGNOSIS — E785 Hyperlipidemia, unspecified: Secondary | ICD-10-CM | POA: Diagnosis not present

## 2023-01-04 DIAGNOSIS — M87052 Idiopathic aseptic necrosis of left femur: Secondary | ICD-10-CM | POA: Diagnosis not present

## 2023-01-04 DIAGNOSIS — Z7982 Long term (current) use of aspirin: Secondary | ICD-10-CM | POA: Diagnosis not present

## 2023-01-04 DIAGNOSIS — I10 Essential (primary) hypertension: Secondary | ICD-10-CM | POA: Diagnosis not present

## 2023-01-05 DIAGNOSIS — Z96642 Presence of left artificial hip joint: Secondary | ICD-10-CM | POA: Diagnosis not present

## 2023-01-06 DIAGNOSIS — E785 Hyperlipidemia, unspecified: Secondary | ICD-10-CM | POA: Diagnosis not present

## 2023-01-06 DIAGNOSIS — Z7982 Long term (current) use of aspirin: Secondary | ICD-10-CM | POA: Diagnosis not present

## 2023-01-06 DIAGNOSIS — S72052A Unspecified fracture of head of left femur, initial encounter for closed fracture: Secondary | ICD-10-CM | POA: Diagnosis not present

## 2023-01-06 DIAGNOSIS — I10 Essential (primary) hypertension: Secondary | ICD-10-CM | POA: Diagnosis not present

## 2023-01-06 DIAGNOSIS — E039 Hypothyroidism, unspecified: Secondary | ICD-10-CM | POA: Diagnosis not present

## 2023-01-06 DIAGNOSIS — M87052 Idiopathic aseptic necrosis of left femur: Secondary | ICD-10-CM | POA: Diagnosis not present

## 2023-01-06 DIAGNOSIS — K219 Gastro-esophageal reflux disease without esophagitis: Secondary | ICD-10-CM | POA: Diagnosis not present

## 2023-01-06 DIAGNOSIS — Z79891 Long term (current) use of opiate analgesic: Secondary | ICD-10-CM | POA: Diagnosis not present

## 2023-01-06 DIAGNOSIS — J449 Chronic obstructive pulmonary disease, unspecified: Secondary | ICD-10-CM | POA: Diagnosis not present

## 2023-01-10 DIAGNOSIS — K2270 Barrett's esophagus without dysplasia: Secondary | ICD-10-CM | POA: Diagnosis not present

## 2023-01-26 DIAGNOSIS — E039 Hypothyroidism, unspecified: Secondary | ICD-10-CM | POA: Diagnosis not present

## 2023-02-02 DIAGNOSIS — Z96642 Presence of left artificial hip joint: Secondary | ICD-10-CM | POA: Diagnosis not present

## 2023-02-08 DIAGNOSIS — J441 Chronic obstructive pulmonary disease with (acute) exacerbation: Secondary | ICD-10-CM | POA: Diagnosis not present

## 2023-02-08 DIAGNOSIS — R0602 Shortness of breath: Secondary | ICD-10-CM | POA: Diagnosis not present

## 2023-02-08 DIAGNOSIS — I10 Essential (primary) hypertension: Secondary | ICD-10-CM | POA: Diagnosis not present

## 2023-02-08 DIAGNOSIS — J029 Acute pharyngitis, unspecified: Secondary | ICD-10-CM | POA: Diagnosis not present

## 2023-02-08 DIAGNOSIS — R490 Dysphonia: Secondary | ICD-10-CM | POA: Diagnosis not present

## 2023-02-08 DIAGNOSIS — J45901 Unspecified asthma with (acute) exacerbation: Secondary | ICD-10-CM | POA: Diagnosis not present

## 2023-02-08 DIAGNOSIS — R0789 Other chest pain: Secondary | ICD-10-CM | POA: Diagnosis not present

## 2023-03-04 DIAGNOSIS — G2581 Restless legs syndrome: Secondary | ICD-10-CM | POA: Diagnosis not present

## 2023-03-04 DIAGNOSIS — R0602 Shortness of breath: Secondary | ICD-10-CM | POA: Diagnosis not present

## 2023-03-12 DIAGNOSIS — G4733 Obstructive sleep apnea (adult) (pediatric): Secondary | ICD-10-CM | POA: Diagnosis not present

## 2023-03-15 DIAGNOSIS — E785 Hyperlipidemia, unspecified: Secondary | ICD-10-CM | POA: Diagnosis not present

## 2023-03-15 DIAGNOSIS — Z823 Family history of stroke: Secondary | ICD-10-CM | POA: Diagnosis not present

## 2023-03-15 DIAGNOSIS — M1990 Unspecified osteoarthritis, unspecified site: Secondary | ICD-10-CM | POA: Diagnosis not present

## 2023-03-15 DIAGNOSIS — I129 Hypertensive chronic kidney disease with stage 1 through stage 4 chronic kidney disease, or unspecified chronic kidney disease: Secondary | ICD-10-CM | POA: Diagnosis not present

## 2023-03-15 DIAGNOSIS — E039 Hypothyroidism, unspecified: Secondary | ICD-10-CM | POA: Diagnosis not present

## 2023-03-15 DIAGNOSIS — G4733 Obstructive sleep apnea (adult) (pediatric): Secondary | ICD-10-CM | POA: Diagnosis not present

## 2023-03-15 DIAGNOSIS — R269 Unspecified abnormalities of gait and mobility: Secondary | ICD-10-CM | POA: Diagnosis not present

## 2023-03-15 DIAGNOSIS — I2510 Atherosclerotic heart disease of native coronary artery without angina pectoris: Secondary | ICD-10-CM | POA: Diagnosis not present

## 2023-03-15 DIAGNOSIS — Z8249 Family history of ischemic heart disease and other diseases of the circulatory system: Secondary | ICD-10-CM | POA: Diagnosis not present

## 2023-03-15 DIAGNOSIS — Z85828 Personal history of other malignant neoplasm of skin: Secondary | ICD-10-CM | POA: Diagnosis not present

## 2023-03-15 DIAGNOSIS — K219 Gastro-esophageal reflux disease without esophagitis: Secondary | ICD-10-CM | POA: Diagnosis not present

## 2023-03-15 DIAGNOSIS — R32 Unspecified urinary incontinence: Secondary | ICD-10-CM | POA: Diagnosis not present

## 2023-03-15 DIAGNOSIS — M810 Age-related osteoporosis without current pathological fracture: Secondary | ICD-10-CM | POA: Diagnosis not present

## 2023-03-15 DIAGNOSIS — Z809 Family history of malignant neoplasm, unspecified: Secondary | ICD-10-CM | POA: Diagnosis not present

## 2023-03-15 DIAGNOSIS — Z791 Long term (current) use of non-steroidal anti-inflammatories (NSAID): Secondary | ICD-10-CM | POA: Diagnosis not present

## 2023-03-15 DIAGNOSIS — Z87891 Personal history of nicotine dependence: Secondary | ICD-10-CM | POA: Diagnosis not present

## 2023-03-15 DIAGNOSIS — R011 Cardiac murmur, unspecified: Secondary | ICD-10-CM | POA: Diagnosis not present

## 2023-03-15 DIAGNOSIS — I771 Stricture of artery: Secondary | ICD-10-CM | POA: Diagnosis not present

## 2023-03-22 DIAGNOSIS — M85852 Other specified disorders of bone density and structure, left thigh: Secondary | ICD-10-CM | POA: Diagnosis not present

## 2023-03-22 DIAGNOSIS — E538 Deficiency of other specified B group vitamins: Secondary | ICD-10-CM | POA: Diagnosis not present

## 2023-03-22 DIAGNOSIS — J449 Chronic obstructive pulmonary disease, unspecified: Secondary | ICD-10-CM | POA: Diagnosis not present

## 2023-03-22 DIAGNOSIS — M1612 Unilateral primary osteoarthritis, left hip: Secondary | ICD-10-CM | POA: Diagnosis not present

## 2023-03-22 DIAGNOSIS — E039 Hypothyroidism, unspecified: Secondary | ICD-10-CM | POA: Diagnosis not present

## 2023-03-22 DIAGNOSIS — E782 Mixed hyperlipidemia: Secondary | ICD-10-CM | POA: Diagnosis not present

## 2023-03-22 DIAGNOSIS — I10 Essential (primary) hypertension: Secondary | ICD-10-CM | POA: Diagnosis not present

## 2023-03-22 DIAGNOSIS — Z09 Encounter for follow-up examination after completed treatment for conditions other than malignant neoplasm: Secondary | ICD-10-CM | POA: Diagnosis not present

## 2023-03-22 DIAGNOSIS — E559 Vitamin D deficiency, unspecified: Secondary | ICD-10-CM | POA: Diagnosis not present

## 2023-03-29 ENCOUNTER — Ambulatory Visit
Admission: RE | Admit: 2023-03-29 | Discharge: 2023-03-29 | Disposition: A | Discharge status: Home / Self Care | Payer: Medicare PPO | Source: Ambulatory Visit | Attending: Gastroenterology | Admitting: Gastroenterology

## 2023-03-29 ENCOUNTER — Ambulatory Visit: Payer: Medicare PPO | Admitting: Anesthesiology

## 2023-03-29 ENCOUNTER — Encounter
Admission: RE | Disposition: A | Discharge status: Home / Self Care | Payer: Self-pay | Source: Ambulatory Visit | Attending: Gastroenterology

## 2023-03-29 ENCOUNTER — Encounter: Payer: Self-pay | Admitting: *Deleted

## 2023-03-29 DIAGNOSIS — J449 Chronic obstructive pulmonary disease, unspecified: Secondary | ICD-10-CM | POA: Diagnosis not present

## 2023-03-29 DIAGNOSIS — K219 Gastro-esophageal reflux disease without esophagitis: Secondary | ICD-10-CM | POA: Insufficient documentation

## 2023-03-29 DIAGNOSIS — R7303 Prediabetes: Secondary | ICD-10-CM | POA: Insufficient documentation

## 2023-03-29 DIAGNOSIS — K449 Diaphragmatic hernia without obstruction or gangrene: Secondary | ICD-10-CM | POA: Diagnosis not present

## 2023-03-29 DIAGNOSIS — I2510 Atherosclerotic heart disease of native coronary artery without angina pectoris: Secondary | ICD-10-CM | POA: Insufficient documentation

## 2023-03-29 DIAGNOSIS — G4730 Sleep apnea, unspecified: Secondary | ICD-10-CM | POA: Insufficient documentation

## 2023-03-29 DIAGNOSIS — Z87891 Personal history of nicotine dependence: Secondary | ICD-10-CM | POA: Insufficient documentation

## 2023-03-29 DIAGNOSIS — K2270 Barrett's esophagus without dysplasia: Secondary | ICD-10-CM | POA: Insufficient documentation

## 2023-03-29 DIAGNOSIS — E039 Hypothyroidism, unspecified: Secondary | ICD-10-CM | POA: Insufficient documentation

## 2023-03-29 DIAGNOSIS — Z79890 Hormone replacement therapy (postmenopausal): Secondary | ICD-10-CM | POA: Insufficient documentation

## 2023-03-29 DIAGNOSIS — I10 Essential (primary) hypertension: Secondary | ICD-10-CM | POA: Diagnosis not present

## 2023-03-29 DIAGNOSIS — K317 Polyp of stomach and duodenum: Secondary | ICD-10-CM | POA: Insufficient documentation

## 2023-03-29 HISTORY — DX: Chronic obstructive pulmonary disease, unspecified: J44.9

## 2023-03-29 HISTORY — PX: BIOPSY: SHX5522

## 2023-03-29 HISTORY — DX: Prediabetes: R73.03

## 2023-03-29 HISTORY — PX: ESOPHAGOGASTRODUODENOSCOPY (EGD) WITH PROPOFOL: SHX5813

## 2023-03-29 SURGERY — ESOPHAGOGASTRODUODENOSCOPY (EGD) WITH PROPOFOL
Anesthesia: General

## 2023-03-29 MED ORDER — SODIUM CHLORIDE 0.9 % IV SOLN: INTRAVENOUS | Status: DC

## 2023-03-29 MED ORDER — LIDOCAINE HCL (CARDIAC) PF 100 MG/5ML IV SOSY
PREFILLED_SYRINGE | INTRAVENOUS | Status: DC | PRN
  Administered 2023-03-29: 100 mg via INTRAVENOUS

## 2023-03-29 MED ORDER — GLYCOPYRROLATE 0.2 MG/ML IJ SOLN
INTRAMUSCULAR | Status: AC
  Filled 2023-03-29: qty 1

## 2023-03-29 MED ORDER — SODIUM CHLORIDE 0.9 % IV SOLN
INTRAVENOUS | Status: DC | PRN
Start: 2023-03-29 — End: 2023-03-29

## 2023-03-29 MED ORDER — PROPOFOL 10 MG/ML IV BOLUS
INTRAVENOUS | Status: DC | PRN
  Administered 2023-03-29: 100 mg via INTRAVENOUS
  Administered 2023-03-29: 50 mg via INTRAVENOUS

## 2023-03-29 MED ORDER — SODIUM CHLORIDE 0.9 % IV SOLN
2.0000 g | Freq: Once | INTRAVENOUS | Status: AC
  Administered 2023-03-29: 2 g via INTRAVENOUS
  Filled 2023-03-29: qty 2000

## 2023-03-29 MED ORDER — GLYCOPYRROLATE 0.2 MG/ML IJ SOLN
INTRAMUSCULAR | Status: DC | PRN
Start: 2023-03-29 — End: 2023-03-29
  Administered 2023-03-29: .2 mg via INTRAVENOUS

## 2023-03-29 MED ORDER — LIDOCAINE HCL (PF) 2 % IJ SOLN
INTRAMUSCULAR | Status: AC
  Filled 2023-03-29: qty 5

## 2023-03-29 NOTE — H&P (Signed)
Outpatient short stay form Pre-procedure 03/29/2023  [name], MD  Primary Physician: Luciana Axe, NP  Reason for visit:  History of BE  History of present illness:    82 y/o lady with history of hypertension, hyperlipidemia, and hypothyroidism here for EGD for history of BE. No blood thinners except aspirin. History of partial colectomy due to diverticulitis. No family history of GI malignancies.   No current facility-administered medications for this encounter.  Medications Prior to Admission  Medication Sig Dispense Refill Last Dose   amLODipine (NORVASC) 5 MG tablet Take by mouth.   03/29/2023   aspirin EC 81 MG tablet Take 81 mg by mouth at bedtime.    03/28/2023   carvedilol (COREG) 3.125 MG tablet Take by mouth.   03/28/2023   Ipratropium-Albuterol (COMBIVENT) 20-100 MCG/ACT AERS respimat Inhale 1 puff into the lungs every 6 (six) hours as needed.   03/29/2023   lisinopril (PRINIVIL,ZESTRIL) 40 MG tablet Take 1 tablet (40 mg total) by mouth daily. bedtime (Patient taking differently: Take 40 mg by mouth daily.) 90 tablet 3 03/29/2023   omeprazole (PRILOSEC) 20 MG capsule Take 1 capsule (20 mg total) by mouth daily. 90 capsule 3 03/28/2023   SYNTHROID 100 MCG tablet Take 100 mcg by mouth daily before breakfast.   03/29/2023   vitamin B-12 (CYANOCOBALAMIN) 1000 MCG tablet Take 1,000 mcg by mouth daily.   03/28/2023   acetaminophen (TYLENOL) 500 MG tablet Take 2 tablets (1,000 mg total) by mouth every 8 (eight) hours. 30 tablet 0    albuterol (VENTOLIN HFA) 108 (90 Base) MCG/ACT inhaler Inhale 2 puffs into the lungs every 4 (four) hours as needed. 18 g 0    Artificial Tear Solution (GENTEAL TEARS OP) Apply 1 application to eye at bedtime.      Calcium Carbonate-Vit D-Min (CALCIUM 1200 PO) Take 1 tablet by mouth daily.       Cholecalciferol (VITAMIN D3) 2000 UNITS TABS Take 2,000 Units by mouth every evening.       diclofenac Sodium (VOLTAREN ARTHRITIS PAIN) 1 % GEL Apply 2 g  topically 4 (four) times daily as needed. 50 g 0    docusate sodium (COLACE) 100 MG capsule Take 1 capsule (100 mg total) by mouth 2 (two) times daily. 10 capsule 0    enoxaparin (LOVENOX) 40 MG/0.4ML injection Inject 0.4 mLs (40 mg total) into the skin daily for 14 days. 5.6 mL 0    Multiple Vitamin (MULTIVITAMIN WITH MINERALS) TABS tablet Take 1 tablet by mouth daily.      ondansetron (ZOFRAN) 4 MG tablet Take 1 tablet (4 mg total) by mouth every 6 (six) hours as needed for nausea. 20 tablet 0    oxyCODONE (ROXICODONE) 5 MG immediate release tablet Take 0.5-1 tablets (2.5-5 mg total) by mouth every 6 (six) hours as needed for breakthrough pain. (Patient not taking: Reported on 03/29/2023) 5 tablet 0 Completed Course   simvastatin (ZOCOR) 20 MG tablet Take 1 tablet (20 mg total) by mouth daily. bedtime 90 tablet 3    Spacer/Aero-Holding Chambers (AEROCHAMBER MV) inhaler Use as instructed (Patient not taking: Reported on 12/22/2022) 1 each 2    traMADol (ULTRAM) 50 MG tablet Take 1 tablet (50 mg total) by mouth every 6 (six) hours as needed for moderate pain. (Patient not taking: Reported on 03/29/2023) 30 tablet 0 Not Taking     Allergies  Allergen Reactions   Carvedilol Other (See Comments)    No energy, fatigued - higher doses  Hctz [Hydrochlorothiazide] Other (See Comments)    Abnormal labs hyponatremia   Spironolactone Other (See Comments)    Hyponatremia     Past Medical History:  Diagnosis Date   Anemia    Arthritis    Barrett's esophagus 2012   2010 upper endoscopy showed only reflux. 2012 biopsies suggested Barrett's epithelial changes.   Bladder tumor    BPPV (benign paroxysmal positional vertigo) 2004   [facility]) 01/2018   bladder with low potential malignancy. tumor removed and did not require further treatment   Carotid artery narrowing 08/14/2014   COPD (chronic obstructive pulmonary disease) (HCC)    Coronary artery disease 01/2018   sees Dr. Gwen Pounds   Family  history of adverse reaction to anesthesia    sister had memory problems after delivery of child   GERD (gastroesophageal reflux disease)    H/O measles    Heart murmur    Hematuria    Hematuria    Hemorrhoid    Hemorrhoids    History of chicken pox    Hyperlipidemia    Hypertension    Hypothyroidism    Joint pain    Low sodium levels    Mild left ventricular hypertrophy    Onychomycosis    Perforation of sigmoid colon due to diverticulitis 09/26/2017   Pre-diabetes    Primary localized osteoarthritis of left knee 09/06/2017   Reflux    Reflux esophagitis    S/P total knee replacement, 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    Physical Exam  Gen: Alert, oriented. Appears stated age.  HEENT: PERRLA. Lungs: No respiratory distress CV: RRR Abd: soft, benign, no masses Ext: No edema    Planned procedures: Proceed with EGD. The patient understands the nature of the planned procedure, indications, risks, alternatives and potential complications including but not limited to bleeding, infection, perforation, damage to internal organs and possible oversedation/side effects from anesthesia. The patient agrees and gives consent to proceed.  Please refer to procedure notes for findings, recommendations and patient disposition/instructions.     [name], MD [facility]

## 2023-03-29 NOTE — Anesthesia Postprocedure Evaluation (Signed)
Anesthesia Post Note  Patient: Barbara Hernandez  Procedure(s) Performed: ESOPHAGOGASTRODUODENOSCOPY (EGD) WITH PROPOFOL BIOPSY  Anesthesia Type: General Anesthetic complications: no   No notable events documented.   Last Vitals:  Vitals:   03/29/23 1057 03/29/23 1107  BP: 106/85 114/76  Pulse:    Resp:    Temp:    SpO2: 97%     Last Pain:  Vitals:   03/29/23 1107  TempSrc:   PainSc: 0-No pain                 VAN STAVEREN,Makara Lanzo

## 2023-03-29 NOTE — Interval H&P Note (Signed)
History and Physical Interval Note:  03/29/2023 10:29 AM  Barbara Hernandez  has presented today for surgery, with the diagnosis of Barretts esophagus.  The various methods of treatment have been discussed with the patient and family. After consideration of risks, benefits and other options for treatment, the patient has consented to  Procedure(s) with comments: ESOPHAGOGASTRODUODENOSCOPY (EGD) WITH PROPOFOL (N/A) - DR TO ORDER AMP 2 GRAMS BEFORE PROCEDURE PER OFFICE as a surgical intervention.  The patient's history has been reviewed, patient examined, no change in status, stable for surgery.  I have reviewed the patient's chart and labs.  Questions were answered to the patient's satisfaction.     [name]  Ok to proceed with EGD

## 2023-03-29 NOTE — Anesthesia Preprocedure Evaluation (Signed)
Anesthesia Evaluation  Patient identified by MRN, date of birth, ID band Patient awake    Reviewed: Allergy & Precautions, NPO status , Patient's Chart, lab work & pertinent test results  Airway Mallampati: III  TM Distance: >3 FB     Dental  (+) Upper Dentures, Lower Dentures   Pulmonary sleep apnea and Continuous Positive Airway Pressure Ventilation , COPD, former smoker    + decreased breath sounds      Cardiovascular Exercise Tolerance: Good hypertension,  Rhythm:Regular Rate:Normal     Neuro/Psych negative neurological ROS  negative psych ROS   GI/Hepatic Neg liver ROS,GERD  Medicated,,  Endo/Other  Hypothyroidism    Renal/GU negative Renal ROS     Musculoskeletal   Abdominal  (+) + obese  Peds negative pediatric ROS (+)  Hematology  (+) Blood dyscrasia, anemia   Anesthesia Other Findings   Reproductive/Obstetrics                             Anesthesia Physical Anesthesia Plan  ASA: 3  Anesthesia Plan: General   Post-op Pain Management:    Induction: Intravenous  PONV Risk Score and Plan:   Airway Management Planned: Natural Airway and Nasal Cannula  Additional Equipment:   Intra-op Plan:   Post-operative Plan:   Informed Consent:   Plan Discussed with:   Anesthesia Plan Comments:        Anesthesia Quick Evaluation

## 2023-03-29 NOTE — Op Note (Signed)
[facility] Patient Name: Barbara Hernandez Procedure Date: 03/29/2023 10:19 AM MRN: [id number] Account #: [id number] Date of Birth: Dec 28, 1940 Admit Type: Outpatient Age: 82 Room: [facility] ENDO ROOM 1 Gender: Female Note Status: Finalized Instrument Name: Patton Salles Endoscope [id number] Procedure:             Upper GI endoscopy Indications:           Barrett's esophagus Providers:             Eather Colas MD, MD Medicines:             Monitored Anesthesia Care Complications:         No immediate complications. Estimated blood loss:                         Minimal. Procedure:             Pre-Anesthesia Assessment:                        - Prior to the procedure, a History and Physical was                         performed, and patient medications and allergies were                         reviewed. The patient is competent. The risks and                         benefits of the procedure and the sedation options and                         risks were discussed with the patient. All questions                         were answered and informed consent was obtained.                         Patient identification and proposed procedure were                         verified by the physician, the nurse, the                         anesthesiologist, the anesthetist and the technician                         in the endoscopy suite. Mental Status Examination:                         alert and oriented. Airway Examination: normal                         oropharyngeal airway and neck mobility. Respiratory                         Examination: clear to auscultation. CV Examination:                         normal. Prophylactic Antibiotics: The patient does not  require prophylactic antibiotics. Prior                         Anticoagulants: The patient has taken no anticoagulant                         or antiplatelet agents except for aspirin. ASA  Grade                         Assessment: III - A patient with severe systemic                         disease. After reviewing the risks and benefits, the                         patient was deemed in satisfactory condition to                         undergo the procedure. The anesthesia plan was to use                         monitored anesthesia care (MAC). Immediately prior to                         administration of medications, the patient was                         re-assessed for adequacy to receive sedatives. The                         heart rate, respiratory rate, oxygen saturations,                         blood pressure, adequacy of pulmonary ventilation, and                         response to care were monitored throughout the                         procedure. The physical status of the patient was                         re-assessed after the procedure.                        After obtaining informed consent, the endoscope was                         passed under direct vision. Throughout the procedure,                         the patient's blood pressure, pulse, and oxygen                         saturations were monitored continuously. The Endoscope                         was introduced through the mouth, and advanced to the  second part of duodenum. The upper GI endoscopy was                         accomplished without difficulty. The patient tolerated                         the procedure well. Findings:      A small hiatal hernia was present.      One tongue of salmon-colored mucosa was present. The maximum       longitudinal extent of these esophageal mucosal changes was 1 cm in       length. Biopsies were taken with a cold forceps for histology. Estimated       blood loss was minimal.      A few small sessile fundic gland polyps with no stigmata of recent       bleeding were found in the gastric fundus and in the gastric body.      The  examined duodenum was normal. Impression:            - Small hiatal hernia.                        - Salmon-colored mucosa classified as Barrett's stage                         C0-M1 per Prague criteria. Biopsied.                        - A few fundic gland polyps.                        - Normal examined duodenum. Recommendation:        - Discharge patient to home.                        - Resume previous diet.                        - Continue present medications.                        - Await pathology results.                        - If path is unremarkable or shows Barret's without                         dysplasia, would recommend no further surveillance                         endoscopies. Procedure Code(s):     --- Professional ---                        [id number], Esophagogastroduodenoscopy, flexible,                         transoral; with biopsy, single or multiple Diagnosis Code(s):     --- Professional ---                        K44.9, Diaphragmatic hernia without obstruction or  gangrene                        K22.70, Barrett's esophagus without dysplasia                        K31.7, Polyp of stomach and duodenum CPT copyright 2022 American Medical Association. All rights reserved. The codes documented in this report are preliminary and upon coder review may  be revised to meet current compliance requirements. Eather Colas MD, MD 03/29/2023 10:47:49 AM Number of Addenda: 0 Note Initiated On: 03/29/2023 10:19 AM Estimated Blood Loss:  Estimated blood loss was minimal.      [facility]

## 2023-03-29 NOTE — Transfer of Care (Signed)
Immediate Anesthesia Transfer of Care Note  Patient: Barbara Hernandez  Procedure(s) Performed: ESOPHAGOGASTRODUODENOSCOPY (EGD) WITH PROPOFOL BIOPSY  Patient Location: PACU  Anesthesia Type:General  Level of Consciousness: drowsy  Airway & Oxygen Therapy: Patient Spontanous Breathing  Post-op Assessment: Report given to RN and Post -op Vital signs reviewed and stable  Post vital signs: Reviewed and stable  Last Vitals:  Vitals Value Taken Time  BP 93/61 03/29/23 1047  Temp 97.6 03/29/23 1047  Pulse 72 03/29/23 1047  Resp 16 03/29/23 1047  SpO2 100 03/29/23 1047    Last Pain:  Vitals:   03/29/23 0937  TempSrc: Temporal         Complications: No notable events documented.

## 2023-03-30 ENCOUNTER — Encounter: Payer: Self-pay | Admitting: Gastroenterology

## 2023-03-30 LAB — SURGICAL PATHOLOGY

## 2023-04-26 ENCOUNTER — Telehealth: Payer: Self-pay | Admitting: *Deleted

## 2023-04-26 NOTE — Patient Outreach (Signed)
  Care Coordination   Follow Up Visit Note   04/26/2023 Name: Barbara Hernandez MRN: [id number] DOB: 1941/06/10  Barbara Hernandez is a 82 y.o. year old female who sees Luciana Axe, NP for primary care. I spoke with  [name] by phone today.  What matters to the patients health and wellness today?  Patient report she is doing well. Does not feel there are any needs at this time, but will call this RNCM with any concerns.      Goals Addressed             This Visit's Progress    COMPLETED: Care Coordination activities - no follow up needed       Interventions Today    Flowsheet Row Most Recent Value  Chronic Disease   Chronic disease during today's visit Hypertension (HTN), Chronic Obstructive Pulmonary Disease (COPD)  General Interventions   General Interventions Discussed/Reviewed General Interventions Reviewed, Vaccines, Doctor Visits, Health Screening  Vaccines Flu  Doctor Visits Discussed/Reviewed Doctor Visits Reviewed, PCP, Annual Wellness Visits, Specialist  Health Screening Colonoscopy, Mammogram  PCP/Specialist Visits Compliance with follow-up visit  Exercise Interventions   Exercise Discussed/Reviewed Physical Activity, Exercise Reviewed  Physical Activity Discussed/Reviewed Physical Activity Reviewed  [Attends gym 2-3 times a week]  Education Interventions   Education Provided Provided Education, Provided Web-based Education  Provided Verbal Education On Nutrition, Medication, Exercise, When to see the doctor  Pharmacy Interventions   Pharmacy Dicussed/Reviewed Pharmacy Topics Reviewed, Affording Medications              SDOH assessments and interventions completed:  Yes  SDOH Interventions Today    Flowsheet Row Most Recent Value  SDOH Interventions   Food Insecurity Interventions Intervention Not Indicated  Housing Interventions Intervention Not Indicated  Transportation Interventions Intervention Not Indicated  Utilities Interventions  Intervention Not Indicated        Care Coordination Interventions:  Yes, provided   Follow up plan: No further intervention required.   Encounter Outcome:  Patient Visit Completed   Kemper Durie RN, MSN, CCM [place]  [facility], [facility] RN Care Coordinator Direct Dial: [phone] / Main [phone] Fax [phone] Email: [email or web address] Website: [email or web address]

## 2023-04-26 NOTE — Patient Instructions (Signed)
Visit Information  Thank you for taking time to visit with me today. Please don't hesitate to contact me if I can be of assistance to you.  Please call the care guide team at [phone] if you need to cancel or reschedule your appointment.   Please call the Suicide and Crisis Lifeline: 988 call the [place] National Suicide Prevention Lifeline: 639-666-5823 or TTY: 279-056-4928 TTY 908-680-1046) to talk to a trained counselor call 1-800-273-TALK (toll free, 24 hour hotline) call 911 if you are experiencing a Mental Health or Behavioral Health Crisis or need someone to talk to.  Patient verbalizes understanding of instructions and care plan provided today and agrees to view in MyChart. Active MyChart status and patient understanding of how to access instructions and care plan via MyChart confirmed with patient.     The patient has been provided with contact information for the care management team and has been advised to call with any health related questions or concerns.   Kemper Durie RN, MSN, CCM [facility], [facility] RN Care Coordinator Direct Dial: [phone] / Main [phone] Fax [phone] Email: [email or web address] Website: [email or web address]

## 2023-05-03 DIAGNOSIS — Z961 Presence of intraocular lens: Secondary | ICD-10-CM | POA: Diagnosis not present

## 2023-05-03 DIAGNOSIS — I10 Essential (primary) hypertension: Secondary | ICD-10-CM | POA: Diagnosis not present

## 2023-05-03 DIAGNOSIS — E782 Mixed hyperlipidemia: Secondary | ICD-10-CM | POA: Diagnosis not present

## 2023-05-03 DIAGNOSIS — H40013 Open angle with borderline findings, low risk, bilateral: Secondary | ICD-10-CM | POA: Diagnosis not present

## 2023-05-03 DIAGNOSIS — H18593 Other hereditary corneal dystrophies, bilateral: Secondary | ICD-10-CM | POA: Diagnosis not present

## 2023-05-03 DIAGNOSIS — I700 Atherosclerosis of aorta: Secondary | ICD-10-CM | POA: Diagnosis not present

## 2023-05-31 DIAGNOSIS — Z96642 Presence of left artificial hip joint: Secondary | ICD-10-CM | POA: Diagnosis not present

## 2023-06-30 DIAGNOSIS — R768 Other specified abnormal immunological findings in serum: Secondary | ICD-10-CM | POA: Diagnosis not present

## 2023-06-30 DIAGNOSIS — I7300 Raynaud's syndrome without gangrene: Secondary | ICD-10-CM | POA: Diagnosis not present

## 2023-06-30 DIAGNOSIS — M159 Polyosteoarthritis, unspecified: Secondary | ICD-10-CM | POA: Diagnosis not present

## 2023-07-28 DIAGNOSIS — G4733 Obstructive sleep apnea (adult) (pediatric): Secondary | ICD-10-CM | POA: Diagnosis not present

## 2023-08-05 DIAGNOSIS — R5381 Other malaise: Secondary | ICD-10-CM | POA: Diagnosis not present

## 2023-08-05 DIAGNOSIS — J4489 Other specified chronic obstructive pulmonary disease: Secondary | ICD-10-CM | POA: Diagnosis not present

## 2023-08-05 DIAGNOSIS — E66812 Obesity, class 2: Secondary | ICD-10-CM | POA: Diagnosis not present

## 2023-08-05 DIAGNOSIS — M87052 Idiopathic aseptic necrosis of left femur: Secondary | ICD-10-CM | POA: Diagnosis not present

## 2023-08-05 DIAGNOSIS — Z6839 Body mass index (BMI) 39.0-39.9, adult: Secondary | ICD-10-CM | POA: Diagnosis not present

## 2023-08-25 DIAGNOSIS — R053 Chronic cough: Secondary | ICD-10-CM | POA: Diagnosis not present

## 2023-08-25 DIAGNOSIS — J449 Chronic obstructive pulmonary disease, unspecified: Secondary | ICD-10-CM | POA: Diagnosis not present

## 2023-09-22 ENCOUNTER — Other Ambulatory Visit: Payer: Self-pay | Admitting: Gerontology

## 2023-09-22 DIAGNOSIS — Z0000 Encounter for general adult medical examination without abnormal findings: Secondary | ICD-10-CM | POA: Diagnosis not present

## 2023-09-22 DIAGNOSIS — E039 Hypothyroidism, unspecified: Secondary | ICD-10-CM | POA: Diagnosis not present

## 2023-09-22 DIAGNOSIS — M85852 Other specified disorders of bone density and structure, left thigh: Secondary | ICD-10-CM | POA: Diagnosis not present

## 2023-09-22 DIAGNOSIS — E782 Mixed hyperlipidemia: Secondary | ICD-10-CM | POA: Diagnosis not present

## 2023-09-22 DIAGNOSIS — D508 Other iron deficiency anemias: Secondary | ICD-10-CM | POA: Diagnosis not present

## 2023-09-22 DIAGNOSIS — R011 Cardiac murmur, unspecified: Secondary | ICD-10-CM | POA: Diagnosis not present

## 2023-09-22 DIAGNOSIS — Z1231 Encounter for screening mammogram for malignant neoplasm of breast: Secondary | ICD-10-CM

## 2023-09-22 DIAGNOSIS — Z1331 Encounter for screening for depression: Secondary | ICD-10-CM | POA: Diagnosis not present

## 2023-09-22 DIAGNOSIS — E538 Deficiency of other specified B group vitamins: Secondary | ICD-10-CM | POA: Diagnosis not present

## 2023-09-22 DIAGNOSIS — I272 Pulmonary hypertension, unspecified: Secondary | ICD-10-CM | POA: Diagnosis not present

## 2023-09-22 DIAGNOSIS — J449 Chronic obstructive pulmonary disease, unspecified: Secondary | ICD-10-CM | POA: Diagnosis not present

## 2023-09-22 DIAGNOSIS — R7303 Prediabetes: Secondary | ICD-10-CM | POA: Diagnosis not present

## 2023-09-22 DIAGNOSIS — I7300 Raynaud's syndrome without gangrene: Secondary | ICD-10-CM | POA: Diagnosis not present

## 2023-09-22 DIAGNOSIS — E559 Vitamin D deficiency, unspecified: Secondary | ICD-10-CM | POA: Diagnosis not present

## 2023-09-22 DIAGNOSIS — I10 Essential (primary) hypertension: Secondary | ICD-10-CM | POA: Diagnosis not present

## 2023-09-22 DIAGNOSIS — R160 Hepatomegaly, not elsewhere classified: Secondary | ICD-10-CM | POA: Diagnosis not present

## 2023-09-22 DIAGNOSIS — Z6839 Body mass index (BMI) 39.0-39.9, adult: Secondary | ICD-10-CM | POA: Diagnosis not present

## 2023-09-22 DIAGNOSIS — I700 Atherosclerosis of aorta: Secondary | ICD-10-CM | POA: Diagnosis not present

## 2023-09-22 DIAGNOSIS — G4733 Obstructive sleep apnea (adult) (pediatric): Secondary | ICD-10-CM | POA: Diagnosis not present

## 2023-09-22 DIAGNOSIS — E66812 Obesity, class 2: Secondary | ICD-10-CM | POA: Diagnosis not present

## 2023-09-22 DIAGNOSIS — R911 Solitary pulmonary nodule: Secondary | ICD-10-CM | POA: Diagnosis not present

## 2023-10-25 DIAGNOSIS — R0602 Shortness of breath: Secondary | ICD-10-CM | POA: Diagnosis not present

## 2023-10-25 DIAGNOSIS — G4733 Obstructive sleep apnea (adult) (pediatric): Secondary | ICD-10-CM | POA: Diagnosis not present

## 2023-11-22 ENCOUNTER — Ambulatory Visit
Admission: RE | Admit: 2023-11-22 | Discharge: 2023-11-22 | Disposition: A | Discharge status: Home / Self Care | Source: Ambulatory Visit | Attending: Gerontology | Admitting: Gerontology

## 2023-11-22 DIAGNOSIS — Z1231 Encounter for screening mammogram for malignant neoplasm of breast: Secondary | ICD-10-CM | POA: Diagnosis not present

## 2023-11-30 DIAGNOSIS — J4489 Other specified chronic obstructive pulmonary disease: Secondary | ICD-10-CM | POA: Diagnosis not present

## 2023-11-30 DIAGNOSIS — R5381 Other malaise: Secondary | ICD-10-CM | POA: Diagnosis not present

## 2023-11-30 DIAGNOSIS — R911 Solitary pulmonary nodule: Secondary | ICD-10-CM | POA: Diagnosis not present

## 2023-12-02 ENCOUNTER — Other Ambulatory Visit: Payer: Self-pay | Admitting: Pulmonary Disease

## 2023-12-02 DIAGNOSIS — R911 Solitary pulmonary nodule: Secondary | ICD-10-CM

## 2023-12-13 ENCOUNTER — Ambulatory Visit
Admission: RE | Admit: 2023-12-13 | Discharge: 2023-12-13 | Disposition: A | Discharge status: Home / Self Care | Source: Ambulatory Visit | Attending: Pulmonary Disease | Admitting: Pulmonary Disease

## 2023-12-13 DIAGNOSIS — R911 Solitary pulmonary nodule: Secondary | ICD-10-CM | POA: Insufficient documentation

## 2023-12-13 DIAGNOSIS — J449 Chronic obstructive pulmonary disease, unspecified: Secondary | ICD-10-CM | POA: Diagnosis not present

## 2023-12-13 DIAGNOSIS — R0789 Other chest pain: Secondary | ICD-10-CM | POA: Diagnosis not present

## 2023-12-13 DIAGNOSIS — R0609 Other forms of dyspnea: Secondary | ICD-10-CM | POA: Diagnosis not present

## 2024-01-24 DIAGNOSIS — G4733 Obstructive sleep apnea (adult) (pediatric): Secondary | ICD-10-CM | POA: Diagnosis not present

## 2024-01-24 DIAGNOSIS — J310 Chronic rhinitis: Secondary | ICD-10-CM | POA: Diagnosis not present

## 2024-01-24 DIAGNOSIS — J441 Chronic obstructive pulmonary disease with (acute) exacerbation: Secondary | ICD-10-CM | POA: Diagnosis not present

## 2024-01-25 DIAGNOSIS — G4733 Obstructive sleep apnea (adult) (pediatric): Secondary | ICD-10-CM | POA: Diagnosis not present

## 2024-02-08 DIAGNOSIS — J310 Chronic rhinitis: Secondary | ICD-10-CM | POA: Diagnosis not present

## 2024-02-08 DIAGNOSIS — J4489 Other specified chronic obstructive pulmonary disease: Secondary | ICD-10-CM | POA: Diagnosis not present

## 2024-02-08 DIAGNOSIS — R052 Subacute cough: Secondary | ICD-10-CM | POA: Diagnosis not present

## 2024-03-06 DIAGNOSIS — H9193 Unspecified hearing loss, bilateral: Secondary | ICD-10-CM | POA: Diagnosis not present

## 2024-03-06 DIAGNOSIS — R0982 Postnasal drip: Secondary | ICD-10-CM | POA: Diagnosis not present

## 2024-03-06 DIAGNOSIS — K439 Ventral hernia without obstruction or gangrene: Secondary | ICD-10-CM | POA: Diagnosis not present

## 2024-03-06 DIAGNOSIS — I83812 Varicose veins of left lower extremities with pain: Secondary | ICD-10-CM | POA: Diagnosis not present

## 2024-03-06 DIAGNOSIS — J449 Chronic obstructive pulmonary disease, unspecified: Secondary | ICD-10-CM | POA: Diagnosis not present

## 2024-03-06 DIAGNOSIS — M79672 Pain in left foot: Secondary | ICD-10-CM | POA: Diagnosis not present

## 2024-03-06 DIAGNOSIS — Z1331 Encounter for screening for depression: Secondary | ICD-10-CM | POA: Diagnosis not present

## 2024-03-06 DIAGNOSIS — R1903 Right lower quadrant abdominal swelling, mass and lump: Secondary | ICD-10-CM | POA: Diagnosis not present

## 2024-03-09 ENCOUNTER — Encounter: Payer: Self-pay | Admitting: Gerontology

## 2024-03-09 ENCOUNTER — Other Ambulatory Visit: Payer: Self-pay | Admitting: Gerontology

## 2024-03-09 DIAGNOSIS — R1903 Right lower quadrant abdominal swelling, mass and lump: Secondary | ICD-10-CM

## 2024-03-13 ENCOUNTER — Ambulatory Visit
Admission: RE | Admit: 2024-03-13 | Discharge: 2024-03-13 | Disposition: A | Discharge status: Home / Self Care | Source: Ambulatory Visit | Attending: Gerontology | Admitting: Gerontology

## 2024-03-13 DIAGNOSIS — R1903 Right lower quadrant abdominal swelling, mass and lump: Secondary | ICD-10-CM | POA: Diagnosis not present

## 2024-03-28 ENCOUNTER — Ambulatory Visit: Admitting: Podiatry

## 2024-03-28 ENCOUNTER — Ambulatory Visit (INDEPENDENT_AMBULATORY_CARE_PROVIDER_SITE_OTHER)

## 2024-03-28 DIAGNOSIS — M19072 Primary osteoarthritis, left ankle and foot: Secondary | ICD-10-CM

## 2024-03-28 NOTE — Progress Notes (Signed)
 Subjective:  Patient ID: Barbara Hernandez, female    DOB: January 13, 1941,  MRN: [id number] HPI Chief Complaint  Patient presents with   Foot Pain    Pain on the top of my left foot when I stand first thing in the morning. It dissipates after walking a bit.    83 y.o. female presents with the above complaint.   ROS: Denies fever chills nausea mobic muscle aches pains calf pain back pain chest pain shortness of breath.  Past Medical History:  Diagnosis Date   Anemia    Arthritis    Barrett's esophagus 2012   2010 upper endoscopy showed only reflux. 2012 biopsies suggested Barrett's epithelial changes.   Bladder tumor    BPPV (benign paroxysmal positional vertigo) 2004   Cancer [facility]) 01/2018   bladder with low potential malignancy. tumor removed and did not require further treatment   Carotid artery narrowing 08/14/2014   COPD (chronic obstructive pulmonary disease) (HCC)    Coronary artery disease 01/2018   sees Dr. Hester   Family history of adverse reaction to anesthesia    sister had memory problems after delivery of child   GERD (gastroesophageal reflux disease)    H/O measles    Heart murmur    Hematuria    Hematuria    Hemorrhoid    Hemorrhoids    History of chicken pox    Hyperlipidemia    Hypertension    Hypothyroidism    Joint pain    Low sodium levels    Mild left ventricular hypertrophy    Onychomycosis    Perforation of sigmoid colon due to diverticulitis 09/26/2017   Pre-diabetes    Primary localized osteoarthritis of left knee 09/06/2017   Reflux    Reflux esophagitis    S/P total knee replacement, 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   COLONOSCOPY WITH PROPOFOL  N/A 01/20/2018   Procedure: COLONOSCOPY WITH PROPOFOL ;  Surgeon: Jinny Carmine, MD;  Location: [facility];  Service: Endoscopy;  Laterality: N/A;  sleep apnea   ESOPHAGOGASTRODUODENOSCOPY (EGD) WITH PROPOFOL  N/A 03/15/2016   Procedure: ESOPHAGOGASTRODUODENOSCOPY (EGD) WITH PROPOFOL ;  Surgeon: Reyes LELON Cota, MD;  Location: [facility];  Service: Endoscopy;  Laterality: N/A;   ESOPHAGOGASTRODUODENOSCOPY (EGD) WITH PROPOFOL  N/A 05/11/2018   Procedure: ESOPHAGOGASTRODUODENOSCOPY (EGD) WITH PROPOFOL ;  Surgeon: Jinny Carmine, MD;  Location: [facility];  Service: Endoscopy;  Laterality: N/A;  sleep apnea   ESOPHAGOGASTRODUODENOSCOPY (EGD) WITH PROPOFOL  N/A 03/29/2023   Procedure: ESOPHAGOGASTRODUODENOSCOPY (EGD) WITH PROPOFOL ;  Surgeon: Maryruth Ole DASEN, MD;  Location: [facility];  Service: Endoscopy;  Laterality: N/A;  DR TO ORDER AMP 2 GRAMS BEFORE PROCEDURE PER OFFICE   EYE SURGERY Bilateral 2006   cataract   LAPAROSCOPIC SIGMOID COLECTOMY N/A 02/13/2018   Procedure: LAPAROSCOPIC SIGMOID COLECTOMY;  Surgeon: Jordis Laneta FALCON, MD;  Location: [facility];  Service: General;  Laterality: N/A;   Left wrist fracture Left 1980   pins removed  POPLITEAL SYNOVIAL CYST EXCISION Right    TOTAL HIP ARTHROPLASTY Left 12/22/2022   Procedure: TOTAL HIP ARTHROPLASTY ANTERIOR APPROACH;  Surgeon: Lorelle Hussar, MD;  Location: [facility];  Service: Orthopedics;  Laterality: Left;   TOTAL KNEE ARTHROPLASTY Right 08/09/2016   Procedure: TOTAL KNEE ARTHROPLASTY;  Surgeon: Jane Charleston, MD;  Location: [facility];  Service: Orthopedics;  Laterality: Right;   TOTAL KNEE ARTHROPLASTY Left 09/19/2017   Procedure: TOTAL KNEE ARTHROPLASTY;  Surgeon: Jane Charleston, MD;  Location: [facility] OR;  Service: Orthopedics;   Laterality: Left;   TRANSURETHRAL RESECTION OF BLADDER TUMOR N/A 11/20/2014   Procedure: TRANSURETHRAL RESECTION OF BLADDER TUMOR (TURBT);  Surgeon: Rosina Riis, MD;  Location: [facility];  Service: Urology;  Laterality: N/A;   UPPER GI ENDOSCOPY  2012, 2014    Current Outpatient Medications:    acetaminophen  (TYLENOL ) 500 MG tablet, Take 2 tablets (1,000 mg total) by mouth every 8 (eight) hours., Disp: 30 tablet, Rfl: 0   albuterol  (VENTOLIN  HFA) 108 (90 Base) MCG/ACT inhaler, Inhale 2 puffs into the lungs every 4 (four) hours as needed., Disp: 18 g, Rfl: 0   amLODipine  (NORVASC ) 5 MG tablet, Take by mouth., Disp: , Rfl:    Artificial Tear Solution (GENTEAL TEARS OP), Apply 1 application to eye at bedtime., Disp: , Rfl:    aspirin  EC 81 MG tablet, Take 81 mg by mouth at bedtime. , Disp: , Rfl:    Calcium  Carbonate-Vit D-Min (CALCIUM  1200 PO), Take 1 tablet by mouth daily. , Disp: , Rfl:    carvedilol  (COREG ) 3.125 MG tablet, Take by mouth., Disp: , Rfl:    Cholecalciferol  (VITAMIN D3) 2000 UNITS TABS, Take 2,000 Units by mouth every evening. , Disp: , Rfl:    diclofenac  Sodium (VOLTAREN  ARTHRITIS PAIN) 1 % GEL, Apply 2 g topically 4 (four) times daily as needed., Disp: 50 g, Rfl: 0   docusate sodium  (COLACE) 100 MG capsule, Take 1 capsule (100 mg total) by mouth 2 (two) times daily., Disp: 10 capsule, Rfl: 0   enoxaparin  (LOVENOX ) 40 MG/0.4ML injection, Inject 0.4 mLs (40 mg total) into the skin daily for 14 days., Disp: 5.6 mL, Rfl: 0   lisinopril  (PRINIVIL ,ZESTRIL ) 40 MG tablet, Take 1 tablet (40 mg total) by mouth daily. bedtime (Patient taking differently: Take 40 mg by mouth daily.), Disp: 90 tablet, Rfl: 3   Multiple Vitamin (MULTIVITAMIN WITH MINERALS) TABS tablet, Take 1 tablet by mouth daily., Disp: , Rfl:    omeprazole  (PRILOSEC) 20 MG capsule, Take 1 capsule (20 mg total) by mouth daily., Disp: 90 capsule, Rfl: 3   ondansetron  (ZOFRAN ) 4 MG tablet, Take 1 tablet (4 mg total) by mouth  every 6 (six) hours as needed for nausea., Disp: 20 tablet, Rfl: 0   oxyCODONE  (ROXICODONE ) 5 MG immediate release tablet, Take 0.5-1 tablets (2.5-5 mg total) by mouth every 6 (six) hours as needed for breakthrough pain. (Patient not taking: Reported on 03/29/2023), Disp: 5 tablet, Rfl: 0   simvastatin  (ZOCOR ) 20 MG tablet, Take 1 tablet (20 mg total) by mouth daily. bedtime, Disp: 90 tablet, Rfl: 3   Spacer/Aero-Holding Chambers (AEROCHAMBER MV) inhaler, Use as instructed (Patient not taking: Reported on 12/22/2022), Disp: 1 each, Rfl: 2   SYNTHROID  100 MCG tablet, Take 100 mcg by mouth daily before breakfast., Disp: , Rfl:    traMADol  (ULTRAM ) 50 MG tablet, Take 1 tablet (50 mg total) by mouth every 6 (six) hours as needed for moderate pain. (Patient not taking: Reported on 03/29/2023),  Disp: 30 tablet, Rfl: 0   vitamin B-12 (CYANOCOBALAMIN ) 1000 MCG tablet, Take 1,000 mcg by mouth daily., Disp: , Rfl:   Allergies  Allergen Reactions   Carvedilol  Other (See Comments)    No energy, fatigued - higher doses   Hctz [Hydrochlorothiazide ] Other (See Comments)    Abnormal labs hyponatremia   Spironolactone  Other (See Comments)    Hyponatremia   Review of Systems Objective:  There were no vitals filed for this visit.  General: Well developed, nourished, in no acute distress, alert and oriented x3   Dermatological: Skin is warm, dry and supple bilateral. Nails x 10 are well maintained; remaining integument appears unremarkable at this time. There are no open sores, no preulcerative lesions, no rash or signs of infection present.  Vascular: Dorsalis Pedis artery and Posterior Tibial artery pedal pulses are 2/4 bilateral with immedate capillary fill time. Pedal hair growth present. No varicosities and no lower extremity edema present bilateral.   Neruologic: Grossly intact via light touch bilateral. Vibratory intact via tuning fork bilateral. Protective threshold with Semmes Wienstein monofilament  intact to all pedal sites bilateral. Patellar and Achilles deep tendon reflexes 2+ bilateral. No Babinski or clonus noted bilateral.   Musculoskeletal: No gross boney pedal deformities bilateral. No pain, crepitus, or limitation noted with foot and ankle range of motion bilateral. Muscular strength 5/5 in all groups tested bilateral.  Tenderness on palpation and frontal plane range of motion of the dorsal midfoot left.  Gait: Unassisted, Nonantalgic.    Radiographs:  Radiographs taken today demonstrate osseously mature foot significant osteopenia.  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  Assessment & Plan:   Assessment: Osteoarthritis midfoot left osteopenia left foot.  Plan: Discussed appropriate shoe gear topical anti-inflammatories injectable anti-inflammatories and oral therapies.  At this point she feels better just to know what the issues are and what some conservative options are     Tiare Rohlman T. Lu[place], [place]

## 2024-04-13 DIAGNOSIS — J028 Acute pharyngitis due to other specified organisms: Secondary | ICD-10-CM | POA: Diagnosis not present

## 2024-04-13 DIAGNOSIS — Z03818 Encounter for observation for suspected exposure to other biological agents ruled out: Secondary | ICD-10-CM | POA: Diagnosis not present

## 2024-04-13 DIAGNOSIS — R52 Pain, unspecified: Secondary | ICD-10-CM | POA: Diagnosis not present

## 2024-04-13 DIAGNOSIS — Z1331 Encounter for screening for depression: Secondary | ICD-10-CM | POA: Diagnosis not present

## 2024-04-13 DIAGNOSIS — R0989 Other specified symptoms and signs involving the circulatory and respiratory systems: Secondary | ICD-10-CM | POA: Diagnosis not present

## 2024-04-13 DIAGNOSIS — K439 Ventral hernia without obstruction or gangrene: Secondary | ICD-10-CM | POA: Diagnosis not present

## 2024-04-13 DIAGNOSIS — R059 Cough, unspecified: Secondary | ICD-10-CM | POA: Diagnosis not present

## 2024-04-13 DIAGNOSIS — J029 Acute pharyngitis, unspecified: Secondary | ICD-10-CM | POA: Diagnosis not present

## 2024-04-13 DIAGNOSIS — R5383 Other fatigue: Secondary | ICD-10-CM | POA: Diagnosis not present

## 2024-04-13 NOTE — Progress Notes (Signed)
 Subjective:  CC: Chief Complaint  Patient presents with  . Sore Throat  . Fatigue  . Cough       Patient ID: Barbara Hernandez is a 83 y.o. female. This an established patient is here today for an Acute Problem Office Visit.  HPI: Pt is taking Singulair, Mucinex, Flonase/Azelastine and using an inhaler. She is still bringing up mucus. Says it has gotten some better since starting the Singulair. She often looses her voice for a brief time. Still has to clear her throat a lot, just not as often. Now, since yesterday, she has been having a significant increase in sputum, sore throat, coughing more. Was still bad this am when she woke up. No fevers. Was wheezing. She has had the Covid vaccines and the RSV vaccine. Says the sputum feels like it is coming from more bronchial, not deep chest.  Pt also concerned about a large bulge in the right lower abdomen. Not painful. But concerned because it feels like it is getting bigger. Seems to bulge more after eating.    Review of Systems: Review of Systems  Constitutional:  Negative for activity change, appetite change, chills, fatigue and fever.  HENT:  Positive for congestion, hearing loss, postnasal drip, sore throat (scratchy) and voice change. Negative for ear pain, rhinorrhea, sinus pressure and sinus pain.   Eyes:  Negative for pain and visual disturbance.  Respiratory:  Positive for cough and wheezing. Negative for shortness of breath.   Cardiovascular:  Negative for chest pain and palpitations.  Gastrointestinal:  Positive for abdominal distention. Negative for abdominal pain, diarrhea, nausea and vomiting.  Genitourinary:  Negative for dysuria and hematuria.  Musculoskeletal:  Positive for arthralgias and myalgias. Negative for back pain.  Skin:  Negative for color change and rash.  Neurological:  Negative for dizziness, seizures, syncope, light-headedness and headaches.  All other systems reviewed and are  negative.   Allergies: Carvedilol , Hctz/reserpine/hydralazine  [hydralazine -reserpin-hcthiazid], Hydrochlorothiazide , and Spironolactone   Past Medical/Surgical History: Past Medical History:  Diagnosis Date  . Anemia   . Avascular necrosis of bone of left hip (CMS/HHS-HCC) 12/22/2022  . Bilateral carotid artery stenosis   . Bilateral carotid artery stenosis   . COPD (chronic obstructive pulmonary disease) (CMS/HHS-HCC) 10/27/2022  . Coronary artery disease   . GERD (gastroesophageal reflux disease)   . Heart murmur   . Hyperlipidemia   . Hypertension   . Obesity   . Sleep apnea   . Thyroid  disease   . Unspecified fracture of head of left femur, initial encounter for closed fracture (CMS/HHS-HCC) 12/22/2022   Past Surgical History:  Procedure Laterality Date  . EGD @ [facility]  03/29/2023   Barrett's Esophagus/Fundic gland polyps/EGD path unremarkable. No repeat EGD's needed/CTL  . left breat biopsy    . wrist surgey     broke wrist in 80's    Social History: Social History   Socioeconomic History  . Marital status: Widowed  . Number of children: 2  Tobacco Use  . Smoking status: Former    Current packs/day: 0.50    Average packs/day: 0.5 packs/day for 20.0 years (10.0 ttl pk-yrs)    Types: Cigarettes    Passive exposure: Past  . Smokeless tobacco: Never  Vaping Use  . Vaping status: Never Used  Substance and Sexual Activity  . Alcohol  use: Not Currently    Comment: Occasionally  . Drug use: Never  . Sexual activity: Not Currently    Birth control/protection: None  Social History Narrative   Widowed, 2  daughters.  Four grandsons.  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  Exercise: walks with a neighbor - 2 miles daly.  Wears seatbelt, sunscreen. Dentist: yes. Calcium  in diet: yes.  Veggies: yes.  Drinks plenty of water .  Avoids soda, juice, sweet tea.        Advanced Directives: has a home.     Social Drivers of Health   Food  Insecurity: No Food Insecurity (04/26/2023)   [facility]   Hunger Vital Sign   . Within the past 12 months, you worried that your food would run out before you got the money to buy more.: Never true   . Within the past 12 months, the food you bought just didn't last and you didn't have money to get more.: Never true  Transportation Needs: No Transportation Needs (04/26/2023)   Received from [facility] - Transportation   . Lack of Transportation (Medical): No   . Lack of Transportation (Non-Medical): No  Housing Stability: Unknown (06/30/2023)   Housing Stability Vital Sign   . Homeless in the Last Year: No    Family History: Family History  Problem Relation Name Age of Onset  . Coronary Artery Disease (Blocked arteries around heart) Mother Ronal   . Cancer Mother Ronal        breast   . Myocardial Infarction (Heart attack) Mother Ronal   . Anemia Mother Ronal   . Heart disease Mother Ronal   . High blood pressure (Hypertension) Mother Ronal   . Coronary Artery Disease (Blocked arteries around heart) Father Johnie   . Cancer Father Johnie        lung  . Myocardial Infarction (Heart attack) Father Johnie   . Heart disease Father Johnie   . High blood pressure (Hypertension) Father Johnie   . Cancer Sister Ronal Conger        breast  . Thyroid  disease Sister Ronal Conger   . Alzheimer's disease Sister Ronal Conger 80  . Coronary Artery Disease (Blocked arteries around heart) Sister Ronal Conger   . Myocardial Infarction (Heart attack) Sister Ronal Conger   . Heart disease Sister Ronal Conger   . Peripheral Vascular Disease (PVD or blocked arteries in arms and legs) Sister Ronal Conger   . Neuropathy Brother Charlena   . Alcohol  abuse Brother Tom   . Anxiety Brother Charlena   . Coronary Artery Disease (Blocked arteries around heart) Brother Tom   . Irregular Heart Beat (Arrhythmia) Brother Tom   . Asthma Brother Charlena   . Atrial fibrillation (Abnormal heart rhythm sometimes  requiring treatment with blood thinners) Brother Tom   . Heart disease Brother Tom   . High blood pressure (Hypertension) Brother Tom   . Peripheral Vascular Disease (PVD or blocked arteries in arms and legs) Brother Tom   . Alzheimer's disease Sister  59  . Anxiety Sister Keene   . Neuropathy Sister Keene   . Coronary Artery Disease (Blocked arteries around heart) Sister Keene     Immunizations: Immunization History  Administered Date(s) Administered  . COVID-19 Pfizer Monovalent Vaccine 07/16/2019, 08/08/2019, 03/19/2020, 11/09/2020  . COVID-19 unspecified vaccine 02/20/2023  . COVID-19 vaccine >12 years (Pfizer-Biontech, Bivalent) IM Injection 30 mcg/0.3mL 05/17/2021  . Covid-19 Vaccine 56mcg/0.5ml (>=9yrs) SPIKEVAX Moderna ([facility] Use Only) 03/08/2022, 02/25/2023, 09/15/2023  . Flu Vaccine HD-IIV3 High Dose, IM PF(81yo+)(Fluzone) 03/02/2014, 02/21/2017, 03/08/2018  . Flu Vaccine IIV3, IM with Pres (37MO+)(Afluria, Fluzone) 04/16/2006, 04/11/2009,  03/27/2012  . Flu Vaccine MDCK IIV3 (egg free), IM PF, (36MO+)(Flucelvax) 02/20/2023  . Influenza IIV4, IM PF (65 Yr+) (FLUAD QUAD) 02/21/2019  . Influenza IIV4, IM pres-free 01/23/2016  . Influenza, IM unspecified 03/13/2015, 01/23/2016, 02/21/2017, 03/17/2021, 02/17/2022  . PNEUMOCOCCAL (PCV13) (BIRTH-26YR) VACCINE (PREVNAR 13) 11/23/2013  . PNEUMOCOCCAL (PPSV23)(>=55YRS -OR- >=2 YRS WITH RISK) VACCINE (PNEUMOVAX 23) 11/13/2010  . RSV Adult Vaccine(>=83yr)(Arexvy) 02/26/2022  . RZV(>=22YR -OR-19+YRS IF  IMMCOMP) VACCINE (SHINGRIX) 02/03/2018, 04/08/2018  . TD (>=74YR) VACCINE (TDVAX) 01/23/2016  . TDAP (>=74YR) VACCINE (ADACEL/BOOSTRIX) 11/05/2005, 01/23/2016  . Td (Adult), unspecified 01/23/2016  . Varicella zoster (Zostavax) 07/31/2010    Depression Screening: Depression assessment: Patient underlying factors that can contribute to depression: none  PHQ 2/9 last 3 flowsheet values    09/22/2023    1:44 PM 03/06/2024    3:34 PM  04/13/2024   10:27 AM  PHQ-2/9 Depression Screening   Little interest or pleasure in doing things 0 0 0  Feeling down, depressed, or hopeless 0 0 0  Patient Health Questionnaire-2 Score 0 * 0 0    * Data saved with a previous flowsheet row definition      Is the depression screen above positive? (Score >9) No, the score was 0-4  Time Spent: 0-4 min      Goals:  Goals     . Maintain health/healthy lifestyle        Current Medications: Outpatient Medications Marked as Taking for the 04/13/24 encounter (Same Day Visit) with Steva Clotilda Conn, NP  Medication Sig Dispense Refill  . amLODIPine  (NORVASC ) 2.5 MG tablet TAKE 1 TABLET ONE TIME DAILY 90 tablet 3  . amLODIPine  (NORVASC ) 5 MG tablet TAKE 1 TABLET ONE TIME DAILY 90 tablet 3  . aspirin  81 MG EC tablet Take 81 mg by mouth once daily    . azelastine (ASTELIN) 137 mcg nasal spray Place 1 spray into both nostrils 2 (two) times daily Use with the Flonase you already have at home 30 mL 11  . CALCIUM  CARBONATE ORAL Take 200 mg by mouth once daily    . carboxymethylcellulose (REFRESH PLUS) 0.5 % ophthalmic solution Apply to eye as needed    . celecoxib  (CELEBREX ) 200 MG capsule TAKE 1 CAPSULE EVERY DAY 90 capsule 3  . cholecalciferol , vitamin D3, (VITAMIN D3) 125 mcg (5,000 unit) tablet Take 1 tablet (5,000 Units total) by mouth once daily 360 tablet 11  . cyanocobalamin  (VITAMIN B12) 1000 MCG tablet Take 1,000 mcg by mouth once daily.      SABRA docusate (DOK) 100 mg tablet Take 100 mg by mouth 2 (two) times daily       . fluticasone propionate (FLONASE) 50 mcg/actuation nasal spray Place 1 spray into both nostrils 2 (two) times daily as needed for Rhinitis 16 g 1  . guaiFENesin (MUCINEX) 1,200 mg Ta12     . lisinopriL  (ZESTRIL ) 40 MG tablet TAKE 1 TABLET EVERY DAY 90 tablet 3  . loratadine (CLARITIN) 10 mg tablet Take 1 tablet (10 mg total) by mouth at bedtime 30 tablet 1  . montelukast (SINGULAIR) 10 mg tablet Take 1 tablet  (10 mg total) by mouth at bedtime 30 tablet 11  . omeprazole  (PRILOSEC) 20 MG DR capsule TAKE 1 CAPSULE EVERY DAY 90 capsule 3  . simvastatin  (ZOCOR ) 20 MG tablet TAKE 1 TABLET AT BEDTIME 90 tablet 3  . SYNTHROID  100 mcg tablet TAKE 1 TABLET EVERY DAY ON AN EMPTY STOMACH WITH A GLASS OF WATER  AT LEAST  30-60 MINUTES BEFORE BREAKFAST. 90 tablet 3  . [DISCONTINUED] COMBIVENT RESPIMAT 20-100 mcg/actuation inhaler INHALE 2 PUFFS TWICE DAILY 12 g 3    Medication list above reconciled and reviewed for current and on-going appropriateness using patient's verbal report of what she is taking.      Objective:   Vitals:   04/13/24 1026  BP: 138/76  Pulse: 70  Temp: 36.8 C (98.3 F)   Ht:162.6 cm (5' 4) Wt:93.4 kg (206 lb) AFP:Anib mass index is 35.36 kg/m.   Physical Exam Vitals and nursing note reviewed.  Constitutional:      General: She is not in acute distress.    Appearance: Normal appearance. She is well-developed. She is not diaphoretic.  HENT:     Head: Normocephalic.     Right Ear: Tympanic membrane and ear canal normal. Decreased hearing noted.     Left Ear: Tympanic membrane and ear canal normal. Decreased hearing noted.     Nose:     Right Sinus: No maxillary sinus tenderness or frontal sinus tenderness.     Left Sinus: No maxillary sinus tenderness or frontal sinus tenderness.     Mouth/Throat:     Pharynx: Posterior oropharyngeal erythema (mild) and postnasal drip present.  Eyes:     Pupils: Pupils are equal, round, and reactive to light.  Neck:     Vascular: No JVD.  Cardiovascular:     Rate and Rhythm: Normal rate and regular rhythm.     Heart sounds: Normal heart sounds.  Pulmonary:     Effort: Pulmonary effort is normal.     Breath sounds: Examination of the left-upper field reveals wheezing. Examination of the right-lower field reveals decreased breath sounds. Examination of the left-lower field reveals decreased breath sounds. Decreased breath sounds and wheezing  present. No rhonchi.  Abdominal:     Hernia: A hernia is present. Hernia is present in the ventral area. There is no hernia in the right inguinal area.   Musculoskeletal:     Cervical back: Normal range of motion.     Left knee: No swelling, deformity, effusion, erythema, ecchymosis or crepitus. Tenderness (ache- especially related to varicose vein in the posterior knee) present.     Left foot: Normal range of motion. Tenderness (aches when weight bearing) present. No swelling, deformity, laceration or crepitus.  Lymphadenopathy:     Head:     Right side of head: Submandibular adenopathy present.     Left side of head: Submandibular adenopathy present.  Skin:    General: Skin is warm and dry.  Neurological:     Mental Status: She is alert and oriented to person, place, and time.  Psychiatric:        Behavior: Behavior normal. Behavior is cooperative.        Thought Content: Thought content normal.        Judgment: Judgment normal.         Same Day Visit on 04/13/2024  Component Date Value Ref Range Status  . Influenza A PCR 04/13/2024 Negative  Negative Final  . Influenza B PCR 04/13/2024 Negative  Negative Final  . RSV PCR 04/13/2024 Negative  Negative Final  . SARS-CoV2 PCR 04/13/2024 Negative  Negative Final  . Xpress Strep A, PCR 04/13/2024 Not Detected  Not Detected, INVALID Final     Assessment and Plan:   Diagnoses and all orders for this visit:  Sore throat -     Extended Respiratory Viral Panel - [facility]; Future -  Cancel: Strep A, Rapid Strep -     Xpress Strep A, PCR - [facility]  Cough, unspecified type -     Extended Respiratory Viral Panel [facility]; Future -     Cancel: Strep A, Rapid Strep -     X-ray chest PA and lateral; Future -     Xpress Strep A, PCR - [facility] -     albuterol -budesonide 90-80 mcg/actuation HFAA; Inhale 2 inhalations into the lungs every 4 (four) hours as needed  Other fatigue -     Extended Respiratory Viral Panel [facility]; Future -     Cancel: Strep A, Rapid Strep -     X-ray chest PA and lateral; Future -     Xpress Strep A, PCR - [facility]  Body aches -     Extended Respiratory Viral Panel [facility]; Future -     Cancel: Strep A, Rapid Strep -     Xpress Strep A, PCR - [facility]  Depression screening (Z13.31) -     Depression Screen -(PHQ- 2/9, BDI) -     Xpress Strep A, PCR - [facility]  Chest congestion -     X-ray chest PA and lateral; Future -     Xpress Strep A, PCR - [facility]  Ventral hernia without obstruction or gangrene -     Xpress Strep A, PCR - [facility] -     Ambulatory Referral to General Surgery    No follow-ups on file., sooner as needed.    Current Medical Providers and Suppliers:   [facility] Patient Care Team: Steva Clotilda Conn, NP as PCP - General (Family Medicine) Arliss, Cathlean Eth, PA as Physician Assistant (Internal Medicine) Future Appointments     Date/Time Provider Department Center Visit Type   04/26/2024 1:30 PM Blitch, Marval Jansky, NP [facility] C RETURN VISIT   05/07/2024 10:00 AM (Arrive by 9:45 AM) Dewane Shiner, DO [facility] FOLLOW UP   06/01/2024 9:00 AM Lorelle Arthea Arch, MD [facility] RETURN   07/02/2024 10:40 AM Defoor, Elsie Conch, PA [facility] C [facility] RETURN VISIT   09/06/2024 3:00 PM Steva Clotilda Conn, NP [facility] OFFICE VISIT   11/20/2024 2:30 PM Parris Manna, MD [facility] C RETURN VISIT        Attestation Statement:   I personally performed the service, non-incident to. (WP)   SHANNON HIATT COWARD, NP  An after visit summary with all of these plans was provided for the patient either in written format or through MyChart.       Clotilda H. Coward, AGNP-C    *Some images could not be shown.

## 2024-04-23 DIAGNOSIS — H40013 Open angle with borderline findings, low risk, bilateral: Secondary | ICD-10-CM | POA: Diagnosis not present

## 2024-04-23 DIAGNOSIS — Z961 Presence of intraocular lens: Secondary | ICD-10-CM | POA: Diagnosis not present

## 2024-04-23 DIAGNOSIS — H18593 Other hereditary corneal dystrophies, bilateral: Secondary | ICD-10-CM | POA: Diagnosis not present

## 2024-04-24 DIAGNOSIS — K432 Incisional hernia without obstruction or gangrene: Secondary | ICD-10-CM | POA: Diagnosis not present

## 2024-04-26 DIAGNOSIS — J4489 Other specified chronic obstructive pulmonary disease: Secondary | ICD-10-CM | POA: Diagnosis not present

## 2024-04-26 DIAGNOSIS — R053 Chronic cough: Secondary | ICD-10-CM | POA: Diagnosis not present

## 2024-05-01 DIAGNOSIS — H18513 Endothelial corneal dystrophy, bilateral: Secondary | ICD-10-CM | POA: Diagnosis not present

## 2024-05-01 DIAGNOSIS — H26491 Other secondary cataract, right eye: Secondary | ICD-10-CM | POA: Diagnosis not present

## 2024-05-01 DIAGNOSIS — H16223 Keratoconjunctivitis sicca, not specified as Sjogren's, bilateral: Secondary | ICD-10-CM | POA: Diagnosis not present

## 2024-05-01 DIAGNOSIS — Z961 Presence of intraocular lens: Secondary | ICD-10-CM | POA: Diagnosis not present

## 2024-05-07 DIAGNOSIS — I10 Essential (primary) hypertension: Secondary | ICD-10-CM | POA: Diagnosis not present

## 2024-05-07 DIAGNOSIS — E782 Mixed hyperlipidemia: Secondary | ICD-10-CM | POA: Diagnosis not present

## 2024-05-07 DIAGNOSIS — I700 Atherosclerosis of aorta: Secondary | ICD-10-CM | POA: Diagnosis not present

## 2024-06-05 ENCOUNTER — Encounter: Payer: Self-pay | Admitting: Emergency Medicine

## 2024-06-05 ENCOUNTER — Ambulatory Visit
Admission: EM | Admit: 2024-06-05 | Discharge: 2024-06-05 | Disposition: A | Discharge status: Home / Self Care | Attending: Physician Assistant | Admitting: Physician Assistant

## 2024-06-05 DIAGNOSIS — N898 Other specified noninflammatory disorders of vagina: Secondary | ICD-10-CM

## 2024-06-05 DIAGNOSIS — I10 Essential (primary) hypertension: Secondary | ICD-10-CM

## 2024-06-05 LAB — POCT URINE DIPSTICK
Bilirubin, UA: NEGATIVE
Blood, UA: NEGATIVE
Glucose, UA: NEGATIVE mg/dL
Ketones, POC UA: NEGATIVE mg/dL
Leukocytes, UA: NEGATIVE
Nitrite, UA: NEGATIVE
Protein Ur, POC: NEGATIVE mg/dL
Spec Grav, UA: 1.01 (ref 1.010–1.025)
Urobilinogen, UA: 0.2 U/dL
pH, UA: 6 (ref 5.0–8.0)

## 2024-06-05 NOTE — ED Triage Notes (Signed)
 Pt c/o vaginal discomfort x 1 week.

## 2024-06-05 NOTE — Discharge Instructions (Signed)
-   Normal urine.  Return if any painful urination, urgency, frequency, blood in urine, vaginal discharge/itching or odor. - BP is high.  Continue home meds and if consistently greater than 1 4090 please see PCP.

## 2024-06-05 NOTE — ED Provider Notes (Signed)
 [facility]    CSN: [id number] Arrival date & time: 06/05/24  1334      History   Chief Complaint Chief Complaint  Patient presents with   vaginal discomfort    HPI DE LIBMAN is a 83 y.o. female presenting for approximately 1 week history of intermittent vulvar irritation.  Denies painful urination, frequency, vaginal itching, genital rashes/sores, fever, fatigue, abdominal pain, hematuria, flank pain, vaginal discharge or odor.  Patient believes she might have a urinary tract infection.  States she knows she is not having classic symptoms of UTI but is also aware elderly people do not present with typical UTI symptoms.  She would like to have her urine checked for possible UTI especially since she is having family come over the next week.  No other complaints or concerns.  Medical history significant for hypothyroidism, GERD, Barrett's esophagus, mitral incompetence, sleep apnea, anemia, hypertension, hyperlipidemia, prediabetes, COPD, CAD and sleep apnea.  HPI  Past Medical History:  Diagnosis Date   Anemia    Arthritis    Barrett's esophagus 2012   2010 upper endoscopy showed only reflux. 2012 biopsies suggested Barrett's epithelial changes.   Bladder tumor    BPPV (benign paroxysmal positional vertigo) 2004   Cancer [facility]) 01/2018   bladder with low potential malignancy. tumor removed and did not require further treatment   Carotid artery narrowing 08/14/2014   COPD (chronic obstructive pulmonary disease) (HCC)    Coronary artery disease 01/2018   sees Dr. Hester   Family history of adverse reaction to anesthesia    sister had memory problems after delivery of child   GERD (gastroesophageal reflux disease)    H/O measles    Heart murmur    Hematuria    Hematuria    Hemorrhoid    Hemorrhoids    History of chicken pox    Hyperlipidemia    Hypertension    Hypothyroidism    Joint pain    Low sodium levels    Mild left ventricular hypertrophy     Onychomycosis    Perforation of sigmoid colon due to diverticulitis 09/26/2017   Pre-diabetes    Primary localized osteoarthritis of left knee 09/06/2017   Reflux    Reflux esophagitis    S/P total knee replacement, 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   left knee replacement 09/2017   Sleep apnea 2011   Uses C-Pap machine   Thyroid  disease    Wears dentures    full upper and lower    Patient Active Problem List   Diagnosis Date Noted   Hip fracture (HCC) 12/22/2022   Unspecified fracture of head of left femur, initial encounter for closed fracture (HCC) 12/22/2022   Avascular necrosis of bone of left hip (HCC) 12/22/2022   S/P laparoscopic colectomy 02/13/2018   Recto-bladder neck fistula    Fistula of intestine, excluding rectum and anus    Preop examination    Obesity, unspecified 10/13/2017   Perforation of sigmoid colon due to diverticulitis 09/26/2017   Primary localized osteoarthritis of left knee 09/06/2017   Benign essential hypertension 03/18/2015   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   Arthritis, degenerative 11/22/2014   Hypothyroidism 10/08/2014   Barrett's esophagus 10/08/2014   Hypercholesteremia 10/08/2014   Hypertension 10/08/2014   Hematuria 10/08/2014   Mild left ventricular hypertrophy 10/08/2014   Pernicious anemia 10/08/2014   Vitamin D  deficiency 10/08/2014   Vertigo, benign paroxysmal 10/08/2014   Systolic murmur 10/08/2014   Sleep apnea 10/08/2014   Arthritis of shoulder region, right, degenerative 10/08/2014  Allergic rhinitis 10/08/2014   Carotid artery narrowing 08/14/2014   MI (mitral incompetence) 08/14/2014   Esophageal reflux 09/07/2012   Hemorrhoid     Past Surgical History:  Procedure Laterality Date   BIOPSY  03/29/2023   Procedure: BIOPSY;  Surgeon: Maryruth Ole DASEN, MD;  Location: [facility];  Service: Endoscopy;;   BLEPHAROPLASTY Bilateral 2015   BREAST EXCISIONAL BIOPSY Left 1988   benign   COLONOSCOPY W/ BIOPSIES N/A 01/16/2013   No source for  GI blood loss, mild lymphatic prominence in the rectum.   COLONOSCOPY WITH PROPOFOL  N/A 01/20/2018   Procedure: COLONOSCOPY WITH PROPOFOL ;  Surgeon: Jinny Carmine, MD;  Location: [facility];  Service: Endoscopy;  Laterality: N/A;  sleep apnea   ESOPHAGOGASTRODUODENOSCOPY (EGD) WITH PROPOFOL  N/A 03/15/2016   Procedure: ESOPHAGOGASTRODUODENOSCOPY (EGD) WITH PROPOFOL ;  Surgeon: Reyes LELON Cota, MD;  Location: [facility];  Service: Endoscopy;  Laterality: N/A;   ESOPHAGOGASTRODUODENOSCOPY (EGD) WITH PROPOFOL  N/A 05/11/2018   Procedure: ESOPHAGOGASTRODUODENOSCOPY (EGD) WITH PROPOFOL ;  Surgeon: Jinny Carmine, MD;  Location: [facility];  Service: Endoscopy;  Laterality: N/A;  sleep apnea   ESOPHAGOGASTRODUODENOSCOPY (EGD) WITH PROPOFOL  N/A 03/29/2023   Procedure: ESOPHAGOGASTRODUODENOSCOPY (EGD) WITH PROPOFOL ;  Surgeon: Maryruth Ole DASEN, MD;  Location: [facility];  Service: Endoscopy;  Laterality: N/A;  DR TO ORDER AMP 2 GRAMS BEFORE PROCEDURE PER OFFICE   EYE SURGERY Bilateral 2006   cataract   LAPAROSCOPIC SIGMOID COLECTOMY N/A 02/13/2018   Procedure: LAPAROSCOPIC SIGMOID COLECTOMY;  Surgeon: Jordis Laneta FALCON, MD;  Location: [facility];  Service: General;  Laterality: N/A;   Left wrist fracture Left 1980   pins removed   POPLITEAL SYNOVIAL CYST EXCISION Right    TOTAL HIP ARTHROPLASTY Left 12/22/2022   Procedure: TOTAL HIP ARTHROPLASTY ANTERIOR APPROACH;  Surgeon: Lorelle Hussar, MD;  Location: [facility];  Service: Orthopedics;  Laterality: Left;   TOTAL KNEE ARTHROPLASTY Right 08/09/2016   Procedure: TOTAL KNEE ARTHROPLASTY;  Surgeon: Jane Charleston, MD;  Location: [facility];  Service: Orthopedics;  Laterality: Right;   TOTAL KNEE ARTHROPLASTY Left 09/19/2017   Procedure: TOTAL KNEE ARTHROPLASTY;  Surgeon: Jane Charleston, MD;  Location: [facility];  Service: Orthopedics;  Laterality: Left;   TRANSURETHRAL RESECTION OF BLADDER TUMOR N/A 11/20/2014   Procedure: TRANSURETHRAL RESECTION OF  BLADDER TUMOR (TURBT);  Surgeon: Rosina Riis, MD;  Location: [facility];  Service: Urology;  Laterality: N/A;   UPPER GI ENDOSCOPY  2012, 2014    OB History     Gravida  2   Para  2   Term      Preterm      AB      Living  2      SAB      IAB      Ectopic      Multiple      Live Births           Obstetric Comments  Age at first menstrual period 11 Age at first pregnancy 48          Home Medications    Prior to Admission medications  Medication Sig Start Date End Date Taking? Authorizing Provider  acetaminophen  (TYLENOL ) 500 MG tablet Take 2 tablets (1,000 mg total) by mouth every 8 (eight) hours. 12/23/22   Charlene Debby BROCKS, PA-C  albuterol  (VENTOLIN  HFA) 108 (90 Base) MCG/ACT inhaler Inhale 2 puffs into the lungs every 4 (four) hours as needed. 01/12/22   Jason Leita Blush, MD  amLODipine  (NORVASC ) 5 MG tablet Take  by mouth. 02/15/20 03/29/23  [provider]  Artificial Tear Solution (GENTEAL TEARS OP) Apply 1 application to eye at bedtime.    [provider]  aspirin  EC 81 MG tablet Take 81 mg by mouth at bedtime.     [provider]  Calcium  Carbonate-Vit D-Min (CALCIUM  1200 PO) Take 1 tablet by mouth daily.     [provider]  carvedilol  (COREG ) 3.125 MG tablet Take by mouth. 04/15/20 03/29/23  [provider]  Cholecalciferol  (VITAMIN D3) 2000 UNITS TABS Take 2,000 Units by mouth every evening.     [provider]  diclofenac  Sodium (VOLTAREN  ARTHRITIS PAIN) 1 % GEL Apply 2 g topically 4 (four) times daily as needed. 01/12/22   Jason Leita Blush, MD  docusate sodium  (COLACE) 100 MG capsule Take 1 capsule (100 mg total) by mouth 2 (two) times daily. 12/23/22   Charlene Debby BROCKS, PA-C  enoxaparin  (LOVENOX ) 40 MG/0.4ML injection Inject 0.4 mLs (40 mg total) into the skin daily for 14 days. 12/24/22 01/07/23  Charlene Debby BROCKS, PA-C  lisinopril  (PRINIVIL ,ZESTRIL ) 40 MG tablet Take 1 tablet (40 mg total) by mouth  daily. bedtime Patient taking differently: Take 40 mg by mouth daily. 05/13/15   Agapito Mom, MD  Multiple Vitamin (MULTIVITAMIN WITH MINERALS) TABS tablet Take 1 tablet by mouth daily.    [provider]  omeprazole  (PRILOSEC) 20 MG capsule Take 1 capsule (20 mg total) by mouth daily. 04/15/15   Agapito Mom, MD  ondansetron  (ZOFRAN ) 4 MG tablet Take 1 tablet (4 mg total) by mouth every 6 (six) hours as needed for nausea. 12/23/22   Charlene Debby BROCKS, PA-C  oxyCODONE  (ROXICODONE ) 5 MG immediate release tablet Take 0.5-1 tablets (2.5-5 mg total) by mouth every 6 (six) hours as needed for breakthrough pain. Patient not taking: Reported on 03/29/2023 12/23/22   Charlene Debby BROCKS, PA-C  simvastatin  (ZOCOR ) 20 MG tablet Take 1 tablet (20 mg total) by mouth daily. bedtime 05/13/15   Agapito Mom, MD  Spacer/Aero-Holding Chambers (AEROCHAMBER MV) inhaler Use as instructed Patient not taking: Reported on 12/22/2022 12/24/21   Bernardino Ditch, NP  SYNTHROID  100 MCG tablet Take 100 mcg by mouth daily before breakfast. 12/17/22   [provider]  traMADol  (ULTRAM ) 50 MG tablet Take 1 tablet (50 mg total) by mouth every 6 (six) hours as needed for moderate pain. Patient not taking: Reported on 03/29/2023 12/23/22   Charlene Debby BROCKS, PA-C  vitamin B-12 (CYANOCOBALAMIN ) 1000 MCG tablet Take 1,000 mcg by mouth daily.    [provider]    Family History Family History  Problem Relation Age of Onset   Heart disease Mother        CHF, CAD   Hypertension Mother    Mental illness Mother        Dementia   Cancer Mother        breast, Brain Cancer   Breast cancer Mother 92   Cancer Father        lung   Heart disease Father        MI   Heart disease Sister    Cancer Sister        Breast   Barrett's esophagus Sister    Hyperlipidemia Sister    Breast cancer Sister 43   Asthma Brother    Heart disease Brother    Diabetes Brother    Anxiety disorder Brother    Neuropathy Brother     Neuropathy Sister    Anxiety  disorder Sister    Mental illness Sister        Dementia   Fibromyalgia Sister     Social History Social History[1]   Allergies   Carvedilol , Hctz [hydrochlorothiazide ], and Spironolactone    Review of Systems Review of Systems  Constitutional:  Negative for chills, fatigue and fever.  Gastrointestinal:  Negative for abdominal pain, diarrhea, nausea and vomiting.  Genitourinary:  Negative for decreased urine volume, dysuria, flank pain, frequency, hematuria, pelvic pain, urgency, vaginal bleeding, vaginal discharge and vaginal pain.       Vaginal sensation  Musculoskeletal:  Negative for back pain.  Skin:  Negative for rash.     Physical Exam Triage Vital Signs ED Triage Vitals  Encounter Vitals Group     BP      Girls Systolic BP Percentile      Girls Diastolic BP Percentile      Boys Systolic BP Percentile      Boys Diastolic BP Percentile      Pulse      Resp      Temp      Temp src      SpO2      Weight      Height      Head Circumference      Peak Flow      Pain Score      Pain Loc      Pain Education      Exclude from Growth Chart    No data found.  Updated Vital Signs BP (!) 169/85 (BP Location: Right Arm)   Pulse 65   Temp 98.2 F (36.8 C) (Oral)   Resp 16   Wt 211 lb (95.7 kg)   SpO2 96%   BMI 38.59 kg/m    Physical Exam Vitals and nursing note reviewed.  Constitutional:      General: She is not in acute distress.    Appearance: Normal appearance. She is not ill-appearing or toxic-appearing.  HENT:     Head: Normocephalic and atraumatic.  Eyes:     General: No scleral icterus.       Right eye: No discharge.        Left eye: No discharge.     Conjunctiva/sclera: Conjunctivae normal.  Cardiovascular:     Rate and Rhythm: Normal rate and regular rhythm.     Heart sounds: Normal heart sounds.  Pulmonary:     Effort: Pulmonary effort is normal. No respiratory distress.     Breath sounds: Normal breath  sounds.  Abdominal:     Palpations: Abdomen is soft.     Tenderness: There is no abdominal tenderness. There is no right CVA tenderness or left CVA tenderness.  Musculoskeletal:     Cervical back: Neck supple.  Skin:    General: Skin is dry.  Neurological:     General: No focal deficit present.     Mental Status: She is alert. Mental status is at baseline.     Motor: No weakness.     Gait: Gait normal.  Psychiatric:        Mood and Affect: Mood normal.        Behavior: Behavior normal.      UC Treatments / Results  Labs (all labs ordered are listed, but only abnormal results are displayed) Labs Reviewed  POCT URINE DIPSTICK - Abnormal; Notable for the following components:      Result Value   Color, UA straw (*)    All other components  within normal limits    EKG   Radiology No results found.  Procedures Procedures (including critical care time)  Medications Ordered in UC Medications - No data to display  Initial Impression / Assessment and Plan / UC Course  I have reviewed the triage vital signs and the nursing notes.  Pertinent labs & imaging results that were available during my care of the patient were reviewed by me and considered in my medical decision making (see chart for details).   83 year old female with multiple comorbidities presents for 1 week history of intermittent abnormal vaginal sensation. Denies pain or painful urination, rashes, vaginal itching or discharge.  Thinks she could have some vaginal irritation due to being in contact with a wet pad.  States she has some incontinence issues and wears adult pads.  BP high 180/83.  Recheck is 169/85.  She reports being a little nervous in the doctor's office.  She does take amlodipine  and carvedilol .  Advised her to continue BP meds and if consistently greater than 140/90 to follow with PCP.  She is afebrile and overall well-appearing.  No acute distress.  On exam no abdominal tenderness or CVA  tenderness.  Chest is clear.  Heart regular rate and rhythm.  Urinalysis obtained. Negative.  Patient declined vaginal swab.  Denies discharge or itching.  She is reassured by normal urinalysis.  Advised her to return if she develops any worsening symptoms.   Final Clinical Impressions(s) / UC Diagnoses   Final diagnoses:  Vaginal irritation  Essential hypertension     Discharge Instructions      - Normal urine.  Return if any painful urination, urgency, frequency, blood in urine, vaginal discharge/itching or odor. - BP is high.  Continue home meds and if consistently greater than 1 4090 please see PCP.     ED Prescriptions   None    PDMP not reviewed this encounter.     [1]  Social History Tobacco Use   Smoking status: Former    Current packs/day: 0.00    Average packs/day: 0.5 packs/day for 20.0 years (10.0 ttl pk-yrs)    Types: Cigarettes    Start date: 06/20/1957    Quit date: 06/20/1977    Years since quitting: 46.9   Smokeless tobacco: Never  Vaping Use   Vaping status: Never Used  Substance Use Topics   Alcohol  use: Yes    Alcohol /week: 1.0 - 3.0 standard drink of alcohol     Types: 1 - 2 Standard drinks or equivalent per week    Comment: occasional   Drug use: No     Arvis Jolan NOVAK, PA-C 06/05/24 1527
